# Patient Record
Sex: Female | Born: 1945 | Race: White | Hispanic: No | Marital: Married | Smoking: Never smoker
Health system: Southern US, Community
[De-identification: ages and names within clinical notes are randomized; demographics above are authoritative.]

## PROBLEM LIST (undated history)

## (undated) DIAGNOSIS — F419 Anxiety disorder, unspecified: Secondary | ICD-10-CM

## (undated) DIAGNOSIS — E8940 Asymptomatic postprocedural ovarian failure: Secondary | ICD-10-CM

## (undated) DIAGNOSIS — E7800 Pure hypercholesterolemia, unspecified: Secondary | ICD-10-CM

## (undated) DIAGNOSIS — E559 Vitamin D deficiency, unspecified: Secondary | ICD-10-CM

## (undated) DIAGNOSIS — E039 Hypothyroidism, unspecified: Secondary | ICD-10-CM

## (undated) DIAGNOSIS — R5383 Other fatigue: Secondary | ICD-10-CM

## (undated) DIAGNOSIS — R112 Nausea with vomiting, unspecified: Secondary | ICD-10-CM

## (undated) DIAGNOSIS — Z9889 Other specified postprocedural states: Secondary | ICD-10-CM

## (undated) DIAGNOSIS — I340 Nonrheumatic mitral (valve) insufficiency: Secondary | ICD-10-CM

## (undated) DIAGNOSIS — C801 Malignant (primary) neoplasm, unspecified: Secondary | ICD-10-CM

## (undated) DIAGNOSIS — H409 Unspecified glaucoma: Secondary | ICD-10-CM

## (undated) HISTORY — PX: FEMUR FRACTURE SURGERY: SHX633

## (undated) HISTORY — DX: Anxiety disorder, unspecified: F41.9

## (undated) HISTORY — DX: Vitamin D deficiency, unspecified: E55.9

## (undated) HISTORY — PX: APPENDECTOMY: SHX54

## (undated) HISTORY — DX: Other fatigue: R53.83

## (undated) HISTORY — DX: Asymptomatic postprocedural ovarian failure: E89.40

## (undated) HISTORY — PX: MANDIBLE FRACTURE SURGERY: SHX706

## (undated) HISTORY — DX: Pure hypercholesterolemia, unspecified: E78.00

## (undated) HISTORY — DX: Hypothyroidism, unspecified: E03.9

## (undated) HISTORY — PX: CHOLECYSTECTOMY: SHX55

---

## 1988-04-26 HISTORY — PX: ABDOMINAL HYSTERECTOMY: SHX81

## 1998-11-24 DIAGNOSIS — S3289XA Fracture of other parts of pelvis, initial encounter for closed fracture: Secondary | ICD-10-CM | POA: Insufficient documentation

## 2002-02-23 DIAGNOSIS — M25519 Pain in unspecified shoulder: Secondary | ICD-10-CM | POA: Insufficient documentation

## 2002-05-15 DIAGNOSIS — B029 Zoster without complications: Secondary | ICD-10-CM | POA: Insufficient documentation

## 2005-03-31 DIAGNOSIS — F411 Generalized anxiety disorder: Secondary | ICD-10-CM | POA: Insufficient documentation

## 2005-11-08 DIAGNOSIS — K8070 Calculus of gallbladder and bile duct without cholecystitis without obstruction: Secondary | ICD-10-CM | POA: Insufficient documentation

## 2007-06-21 DIAGNOSIS — M549 Dorsalgia, unspecified: Secondary | ICD-10-CM | POA: Insufficient documentation

## 2007-06-27 DIAGNOSIS — M5137 Other intervertebral disc degeneration, lumbosacral region: Secondary | ICD-10-CM | POA: Insufficient documentation

## 2007-06-27 DIAGNOSIS — M21169 Varus deformity, not elsewhere classified, unspecified knee: Secondary | ICD-10-CM | POA: Insufficient documentation

## 2007-06-27 DIAGNOSIS — M1710 Unilateral primary osteoarthritis, unspecified knee: Secondary | ICD-10-CM | POA: Insufficient documentation

## 2009-10-15 DIAGNOSIS — K219 Gastro-esophageal reflux disease without esophagitis: Secondary | ICD-10-CM | POA: Insufficient documentation

## 2011-04-27 HISTORY — PX: COLONOSCOPY: SHX174

## 2013-07-11 DIAGNOSIS — M1711 Unilateral primary osteoarthritis, right knee: Secondary | ICD-10-CM | POA: Insufficient documentation

## 2013-07-11 DIAGNOSIS — M25569 Pain in unspecified knee: Secondary | ICD-10-CM | POA: Insufficient documentation

## 2013-07-11 DIAGNOSIS — M65311 Trigger thumb, right thumb: Secondary | ICD-10-CM | POA: Insufficient documentation

## 2013-07-11 DIAGNOSIS — M7542 Impingement syndrome of left shoulder: Secondary | ICD-10-CM | POA: Insufficient documentation

## 2013-07-12 DIAGNOSIS — M5030 Other cervical disc degeneration, unspecified cervical region: Secondary | ICD-10-CM | POA: Insufficient documentation

## 2013-08-20 DIAGNOSIS — G959 Disease of spinal cord, unspecified: Secondary | ICD-10-CM | POA: Insufficient documentation

## 2016-02-23 DIAGNOSIS — H40023 Open angle with borderline findings, high risk, bilateral: Secondary | ICD-10-CM | POA: Diagnosis not present

## 2016-04-08 DIAGNOSIS — H40023 Open angle with borderline findings, high risk, bilateral: Secondary | ICD-10-CM | POA: Diagnosis not present

## 2016-06-22 DIAGNOSIS — H40023 Open angle with borderline findings, high risk, bilateral: Secondary | ICD-10-CM | POA: Diagnosis not present

## 2016-06-27 DIAGNOSIS — H401112 Primary open-angle glaucoma, right eye, moderate stage: Secondary | ICD-10-CM | POA: Insufficient documentation

## 2016-08-02 DIAGNOSIS — H401132 Primary open-angle glaucoma, bilateral, moderate stage: Secondary | ICD-10-CM | POA: Diagnosis not present

## 2016-09-06 DIAGNOSIS — H401112 Primary open-angle glaucoma, right eye, moderate stage: Secondary | ICD-10-CM | POA: Diagnosis not present

## 2016-09-06 DIAGNOSIS — H401122 Primary open-angle glaucoma, left eye, moderate stage: Secondary | ICD-10-CM | POA: Diagnosis not present

## 2016-11-09 DIAGNOSIS — E7800 Pure hypercholesterolemia, unspecified: Secondary | ICD-10-CM | POA: Diagnosis not present

## 2016-11-09 DIAGNOSIS — Z299 Encounter for prophylactic measures, unspecified: Secondary | ICD-10-CM | POA: Diagnosis not present

## 2016-11-09 DIAGNOSIS — K219 Gastro-esophageal reflux disease without esophagitis: Secondary | ICD-10-CM | POA: Diagnosis not present

## 2016-11-09 DIAGNOSIS — H409 Unspecified glaucoma: Secondary | ICD-10-CM | POA: Diagnosis not present

## 2016-11-09 DIAGNOSIS — Z6825 Body mass index (BMI) 25.0-25.9, adult: Secondary | ICD-10-CM | POA: Diagnosis not present

## 2016-11-09 DIAGNOSIS — M545 Low back pain: Secondary | ICD-10-CM | POA: Diagnosis not present

## 2016-12-07 DIAGNOSIS — H401132 Primary open-angle glaucoma, bilateral, moderate stage: Secondary | ICD-10-CM | POA: Diagnosis not present

## 2016-12-08 DIAGNOSIS — E8940 Asymptomatic postprocedural ovarian failure: Secondary | ICD-10-CM | POA: Diagnosis not present

## 2016-12-08 DIAGNOSIS — Z1231 Encounter for screening mammogram for malignant neoplasm of breast: Secondary | ICD-10-CM | POA: Diagnosis not present

## 2016-12-08 DIAGNOSIS — Z1389 Encounter for screening for other disorder: Secondary | ICD-10-CM | POA: Diagnosis not present

## 2016-12-08 DIAGNOSIS — Z6825 Body mass index (BMI) 25.0-25.9, adult: Secondary | ICD-10-CM | POA: Diagnosis not present

## 2016-12-08 DIAGNOSIS — Z0000 Encounter for general adult medical examination without abnormal findings: Secondary | ICD-10-CM | POA: Diagnosis not present

## 2016-12-08 DIAGNOSIS — Z1211 Encounter for screening for malignant neoplasm of colon: Secondary | ICD-10-CM | POA: Diagnosis not present

## 2016-12-08 DIAGNOSIS — E7800 Pure hypercholesterolemia, unspecified: Secondary | ICD-10-CM | POA: Diagnosis not present

## 2016-12-08 DIAGNOSIS — Z299 Encounter for prophylactic measures, unspecified: Secondary | ICD-10-CM | POA: Diagnosis not present

## 2016-12-08 DIAGNOSIS — Z7189 Other specified counseling: Secondary | ICD-10-CM | POA: Diagnosis not present

## 2016-12-30 DIAGNOSIS — E2839 Other primary ovarian failure: Secondary | ICD-10-CM | POA: Diagnosis not present

## 2017-01-10 DIAGNOSIS — Z1231 Encounter for screening mammogram for malignant neoplasm of breast: Secondary | ICD-10-CM | POA: Diagnosis not present

## 2017-06-06 DIAGNOSIS — K5900 Constipation, unspecified: Secondary | ICD-10-CM | POA: Diagnosis not present

## 2017-06-06 DIAGNOSIS — Z6825 Body mass index (BMI) 25.0-25.9, adult: Secondary | ICD-10-CM | POA: Diagnosis not present

## 2017-06-06 DIAGNOSIS — Z299 Encounter for prophylactic measures, unspecified: Secondary | ICD-10-CM | POA: Diagnosis not present

## 2017-06-06 DIAGNOSIS — E7800 Pure hypercholesterolemia, unspecified: Secondary | ICD-10-CM | POA: Diagnosis not present

## 2017-06-06 DIAGNOSIS — R5383 Other fatigue: Secondary | ICD-10-CM | POA: Diagnosis not present

## 2017-06-06 DIAGNOSIS — K649 Unspecified hemorrhoids: Secondary | ICD-10-CM | POA: Diagnosis not present

## 2017-06-06 DIAGNOSIS — R51 Headache: Secondary | ICD-10-CM | POA: Diagnosis not present

## 2017-06-08 DIAGNOSIS — H401132 Primary open-angle glaucoma, bilateral, moderate stage: Secondary | ICD-10-CM | POA: Diagnosis not present

## 2017-06-09 DIAGNOSIS — R279 Unspecified lack of coordination: Secondary | ICD-10-CM | POA: Diagnosis not present

## 2017-06-09 DIAGNOSIS — G43909 Migraine, unspecified, not intractable, without status migrainosus: Secondary | ICD-10-CM | POA: Diagnosis not present

## 2017-08-03 DIAGNOSIS — Z789 Other specified health status: Secondary | ICD-10-CM | POA: Diagnosis not present

## 2017-08-03 DIAGNOSIS — E7800 Pure hypercholesterolemia, unspecified: Secondary | ICD-10-CM | POA: Diagnosis not present

## 2017-08-03 DIAGNOSIS — L259 Unspecified contact dermatitis, unspecified cause: Secondary | ICD-10-CM | POA: Diagnosis not present

## 2017-08-03 DIAGNOSIS — Z6824 Body mass index (BMI) 24.0-24.9, adult: Secondary | ICD-10-CM | POA: Diagnosis not present

## 2017-08-03 DIAGNOSIS — Z299 Encounter for prophylactic measures, unspecified: Secondary | ICD-10-CM | POA: Diagnosis not present

## 2017-08-10 DIAGNOSIS — H2513 Age-related nuclear cataract, bilateral: Secondary | ICD-10-CM | POA: Diagnosis not present

## 2017-08-10 DIAGNOSIS — H401132 Primary open-angle glaucoma, bilateral, moderate stage: Secondary | ICD-10-CM | POA: Diagnosis not present

## 2017-11-30 DIAGNOSIS — H401132 Primary open-angle glaucoma, bilateral, moderate stage: Secondary | ICD-10-CM | POA: Diagnosis not present

## 2017-12-16 DIAGNOSIS — E7800 Pure hypercholesterolemia, unspecified: Secondary | ICD-10-CM | POA: Diagnosis not present

## 2017-12-16 DIAGNOSIS — Z1211 Encounter for screening for malignant neoplasm of colon: Secondary | ICD-10-CM | POA: Diagnosis not present

## 2017-12-16 DIAGNOSIS — R5383 Other fatigue: Secondary | ICD-10-CM | POA: Diagnosis not present

## 2017-12-16 DIAGNOSIS — Z7189 Other specified counseling: Secondary | ICD-10-CM | POA: Diagnosis not present

## 2017-12-16 DIAGNOSIS — Z6824 Body mass index (BMI) 24.0-24.9, adult: Secondary | ICD-10-CM | POA: Diagnosis not present

## 2017-12-16 DIAGNOSIS — C4491 Basal cell carcinoma of skin, unspecified: Secondary | ICD-10-CM | POA: Diagnosis not present

## 2017-12-16 DIAGNOSIS — Z299 Encounter for prophylactic measures, unspecified: Secondary | ICD-10-CM | POA: Diagnosis not present

## 2017-12-16 DIAGNOSIS — Z79899 Other long term (current) drug therapy: Secondary | ICD-10-CM | POA: Diagnosis not present

## 2017-12-16 DIAGNOSIS — L570 Actinic keratosis: Secondary | ICD-10-CM | POA: Diagnosis not present

## 2017-12-16 DIAGNOSIS — Z1339 Encounter for screening examination for other mental health and behavioral disorders: Secondary | ICD-10-CM | POA: Diagnosis not present

## 2017-12-16 DIAGNOSIS — Z0000 Encounter for general adult medical examination without abnormal findings: Secondary | ICD-10-CM | POA: Diagnosis not present

## 2017-12-16 DIAGNOSIS — Z1331 Encounter for screening for depression: Secondary | ICD-10-CM | POA: Diagnosis not present

## 2018-01-06 DIAGNOSIS — Z6824 Body mass index (BMI) 24.0-24.9, adult: Secondary | ICD-10-CM | POA: Diagnosis not present

## 2018-01-06 DIAGNOSIS — L821 Other seborrheic keratosis: Secondary | ICD-10-CM | POA: Diagnosis not present

## 2018-01-06 DIAGNOSIS — Z299 Encounter for prophylactic measures, unspecified: Secondary | ICD-10-CM | POA: Diagnosis not present

## 2018-01-06 DIAGNOSIS — L570 Actinic keratosis: Secondary | ICD-10-CM | POA: Diagnosis not present

## 2018-01-06 DIAGNOSIS — L820 Inflamed seborrheic keratosis: Secondary | ICD-10-CM | POA: Diagnosis not present

## 2018-01-23 DIAGNOSIS — Z1231 Encounter for screening mammogram for malignant neoplasm of breast: Secondary | ICD-10-CM | POA: Diagnosis not present

## 2018-01-27 DIAGNOSIS — Z299 Encounter for prophylactic measures, unspecified: Secondary | ICD-10-CM | POA: Diagnosis not present

## 2018-01-27 DIAGNOSIS — L570 Actinic keratosis: Secondary | ICD-10-CM | POA: Diagnosis not present

## 2018-01-27 DIAGNOSIS — Z6824 Body mass index (BMI) 24.0-24.9, adult: Secondary | ICD-10-CM | POA: Diagnosis not present

## 2018-04-27 DIAGNOSIS — Z6824 Body mass index (BMI) 24.0-24.9, adult: Secondary | ICD-10-CM | POA: Diagnosis not present

## 2018-04-27 DIAGNOSIS — F419 Anxiety disorder, unspecified: Secondary | ICD-10-CM | POA: Diagnosis not present

## 2018-04-27 DIAGNOSIS — Z299 Encounter for prophylactic measures, unspecified: Secondary | ICD-10-CM | POA: Diagnosis not present

## 2018-04-27 DIAGNOSIS — R002 Palpitations: Secondary | ICD-10-CM | POA: Diagnosis not present

## 2018-04-27 DIAGNOSIS — R000 Tachycardia, unspecified: Secondary | ICD-10-CM | POA: Diagnosis not present

## 2018-05-01 DIAGNOSIS — R002 Palpitations: Secondary | ICD-10-CM | POA: Diagnosis not present

## 2018-05-01 DIAGNOSIS — R9431 Abnormal electrocardiogram [ECG] [EKG]: Secondary | ICD-10-CM | POA: Diagnosis not present

## 2018-07-31 DIAGNOSIS — E039 Hypothyroidism, unspecified: Secondary | ICD-10-CM | POA: Diagnosis not present

## 2018-12-21 DIAGNOSIS — Z299 Encounter for prophylactic measures, unspecified: Secondary | ICD-10-CM | POA: Diagnosis not present

## 2018-12-21 DIAGNOSIS — Z1331 Encounter for screening for depression: Secondary | ICD-10-CM | POA: Diagnosis not present

## 2018-12-21 DIAGNOSIS — Z79899 Other long term (current) drug therapy: Secondary | ICD-10-CM | POA: Diagnosis not present

## 2018-12-21 DIAGNOSIS — Z7189 Other specified counseling: Secondary | ICD-10-CM | POA: Diagnosis not present

## 2018-12-21 DIAGNOSIS — R5383 Other fatigue: Secondary | ICD-10-CM | POA: Diagnosis not present

## 2018-12-21 DIAGNOSIS — E559 Vitamin D deficiency, unspecified: Secondary | ICD-10-CM | POA: Diagnosis not present

## 2018-12-21 DIAGNOSIS — E7800 Pure hypercholesterolemia, unspecified: Secondary | ICD-10-CM | POA: Diagnosis not present

## 2018-12-21 DIAGNOSIS — Z1211 Encounter for screening for malignant neoplasm of colon: Secondary | ICD-10-CM | POA: Diagnosis not present

## 2018-12-21 DIAGNOSIS — E039 Hypothyroidism, unspecified: Secondary | ICD-10-CM | POA: Diagnosis not present

## 2018-12-21 DIAGNOSIS — Z6824 Body mass index (BMI) 24.0-24.9, adult: Secondary | ICD-10-CM | POA: Diagnosis not present

## 2018-12-21 DIAGNOSIS — Z0000 Encounter for general adult medical examination without abnormal findings: Secondary | ICD-10-CM | POA: Diagnosis not present

## 2018-12-21 DIAGNOSIS — Z1339 Encounter for screening examination for other mental health and behavioral disorders: Secondary | ICD-10-CM | POA: Diagnosis not present

## 2019-01-02 DIAGNOSIS — H5200 Hypermetropia, unspecified eye: Secondary | ICD-10-CM | POA: Diagnosis not present

## 2019-01-23 DIAGNOSIS — E8940 Asymptomatic postprocedural ovarian failure: Secondary | ICD-10-CM | POA: Diagnosis not present

## 2019-01-23 DIAGNOSIS — M859 Disorder of bone density and structure, unspecified: Secondary | ICD-10-CM | POA: Diagnosis not present

## 2019-01-30 ENCOUNTER — Encounter (INDEPENDENT_AMBULATORY_CARE_PROVIDER_SITE_OTHER): Payer: Self-pay | Admitting: Nurse Practitioner

## 2019-02-01 DIAGNOSIS — H401112 Primary open-angle glaucoma, right eye, moderate stage: Secondary | ICD-10-CM | POA: Diagnosis not present

## 2019-03-29 ENCOUNTER — Other Ambulatory Visit: Payer: Self-pay

## 2019-03-29 ENCOUNTER — Other Ambulatory Visit (INDEPENDENT_AMBULATORY_CARE_PROVIDER_SITE_OTHER): Payer: Self-pay | Admitting: *Deleted

## 2019-03-29 ENCOUNTER — Encounter (INDEPENDENT_AMBULATORY_CARE_PROVIDER_SITE_OTHER): Payer: Self-pay | Admitting: *Deleted

## 2019-03-29 ENCOUNTER — Ambulatory Visit (INDEPENDENT_AMBULATORY_CARE_PROVIDER_SITE_OTHER): Payer: Medicare HMO | Admitting: Nurse Practitioner

## 2019-03-29 ENCOUNTER — Encounter (INDEPENDENT_AMBULATORY_CARE_PROVIDER_SITE_OTHER): Payer: Self-pay | Admitting: Nurse Practitioner

## 2019-03-29 ENCOUNTER — Telehealth (INDEPENDENT_AMBULATORY_CARE_PROVIDER_SITE_OTHER): Payer: Self-pay | Admitting: *Deleted

## 2019-03-29 DIAGNOSIS — R195 Other fecal abnormalities: Secondary | ICD-10-CM | POA: Insufficient documentation

## 2019-03-29 MED ORDER — SUPREP BOWEL PREP KIT 17.5-3.13-1.6 GM/177ML PO SOLN: 1.0000 | Freq: Once | ORAL | 0 refills | Status: AC

## 2019-03-29 NOTE — Telephone Encounter (Signed)
Patient needs suprep  TCS sch'd 1/21

## 2019-03-29 NOTE — Progress Notes (Signed)
Subjective:    Patient ID: Patricia Hurst, female    DOB: [birth year], 73 y.o.   MRN: [id number]  HPI [place] Bigman is 73 year old female with a past medical history of anxiety, hypothyroidism, hypercholesterolemia and remote history of GERD.  Past appendectomy, cholecystectomy and hysterectomy.  She presents today for further evaluation regarding a positive FOBT completed at the time of her recent physical exam.  She denies having any abdominal pain.  She is passing a normal formed brown bowel movement once daily.  He takes Metamucil daily.  She denies having any rectal bleeding or melena.  She reports having hemorrhoids but denies having any hemorrhoidal irritation or bleeding.  She reports completing 2 colonoscopies in her lifetime which were normal, no colon polyps.  Most recent colonoscopy was in 2013 done by Dr. Ahmed Prima in [facility].  No family history of colorectal cancer.  She previously took Zantac for reflux which she stopped approximately 1 year ago.  She has infrequent heartburn which occurs if she eats spicy foods.  She takes Pepcid as needed with symptom relief.  She denies having any dysphagia or stomach pain.  He stated at the time of her physical exam her primary care physician informed she possibly might have minor mitral valve regurgitation.  An echo was not required.  She denies having any chest pain, palpitations or shortness of breath.  History of coronary artery disease.  She denies any other complaints today.  Past Medical History:  Diagnosis Date  . Anxiety   . Fatigue   . Hypercholesteremia   . Hypothyroidism   . Ovarian failure, iatrogenic   . Vitamin D deficiency    Past Surgical History:  Procedure Laterality Date  . ABDOMINAL HYSTERECTOMY  1990  . APPENDECTOMY    . CHOLECYSTECTOMY    . COLONOSCOPY  2013   Dr. Ahmed Prima in [place]   Family History: Mother died mid 78's kidney disease. Father died from brain cancer mid 35's.  He has  3 brothers and 7 sisters.  One sister died from brain cancer.  Several of her siblings had diabetes, COPD and heart disease.  Social History: nonsmoker. No alcohol use. Married.   Review of Systems see HPI, all other systems reviewed and are negative     Objective:   Physical Exam  BP (!) 144/83 (BP Location: Right Arm, Patient Position: Sitting, Cuff Size: Large)   Pulse 74   Temp (!) 97.2 F (36.2 C) (Oral)   Ht 5\' 2"  (1.575 m)   Wt 143 lb 1.6 oz (64.9 kg)   BMI 26.17 kg/m  General: 73 year old female well-developed alert in no acute distress Eyes: Sclera nonicteric, conjunctiva pink Mouth: Few missing upper dentition, lower partial plate, no ulcers or lesions Neck: Supple Heart: Regular rate and rhythm, no murmurs Lungs: Breath sounds clear throughout Abdomen: Soft, nontender, no masses or organomegaly, positive bowel sounds to all 4 quadrants Extremities: No edema Neuro: Alert and oriented x4, no focal deficits    Assessment & Plan:   34.  73 year old female with a positive FOBT presents to schedule a colonoscopy -Colonoscopy benefits and risk discussed including risk with sedation, risk of bleeding, perforation infection -Request copy of most recent CBC from PCPs office -Further follow-up to be determined after colonoscopy completed  2.  Remote history of GERD with infrequent symptoms -Pepcid OTC 1 tab daily as needed -Patient will call our office if she has increased reflux symptoms -If she is anemic or  if her colonoscopy is negative an EGD should be done

## 2019-03-29 NOTE — Patient Instructions (Signed)
1.  Schedule a colonoscopy  2.  Further follow-up to be determined after colonoscopy completed

## 2019-04-05 ENCOUNTER — Encounter (INDEPENDENT_AMBULATORY_CARE_PROVIDER_SITE_OTHER): Payer: Self-pay | Admitting: *Deleted

## 2019-05-08 DIAGNOSIS — E039 Hypothyroidism, unspecified: Secondary | ICD-10-CM | POA: Diagnosis not present

## 2019-05-08 DIAGNOSIS — Z299 Encounter for prophylactic measures, unspecified: Secondary | ICD-10-CM | POA: Diagnosis not present

## 2019-05-15 ENCOUNTER — Other Ambulatory Visit: Payer: Self-pay

## 2019-05-15 ENCOUNTER — Other Ambulatory Visit (HOSPITAL_COMMUNITY): Payer: Medicare HMO

## 2019-05-15 ENCOUNTER — Other Ambulatory Visit (HOSPITAL_COMMUNITY)
Admission: RE | Admit: 2019-05-15 | Discharge: 2019-05-15 | Disposition: A | Discharge status: Home / Self Care | Payer: Medicare HMO | Source: Ambulatory Visit | Attending: Internal Medicine | Admitting: Internal Medicine

## 2019-05-15 DIAGNOSIS — Z20822 Contact with and (suspected) exposure to covid-19: Secondary | ICD-10-CM | POA: Diagnosis not present

## 2019-05-15 DIAGNOSIS — Z01812 Encounter for preprocedural laboratory examination: Secondary | ICD-10-CM | POA: Diagnosis not present

## 2019-05-15 LAB — SARS CORONAVIRUS 2 (TAT 6-24 HRS): SARS Coronavirus 2: NEGATIVE

## 2019-05-17 ENCOUNTER — Ambulatory Visit (HOSPITAL_COMMUNITY)
Admission: RE | Admit: 2019-05-17 | Discharge: 2019-05-17 | Disposition: A | Discharge status: Home / Self Care | Payer: Medicare HMO | Attending: Internal Medicine | Admitting: Internal Medicine

## 2019-05-17 ENCOUNTER — Encounter (HOSPITAL_COMMUNITY): Payer: Self-pay | Admitting: Internal Medicine

## 2019-05-17 ENCOUNTER — Other Ambulatory Visit: Payer: Self-pay

## 2019-05-17 ENCOUNTER — Encounter (HOSPITAL_COMMUNITY)
Admission: RE | Disposition: A | Discharge status: Home / Self Care | Payer: Self-pay | Source: Home / Self Care | Attending: Internal Medicine

## 2019-05-17 DIAGNOSIS — Z7982 Long term (current) use of aspirin: Secondary | ICD-10-CM | POA: Diagnosis not present

## 2019-05-17 DIAGNOSIS — C183 Malignant neoplasm of hepatic flexure: Secondary | ICD-10-CM | POA: Insufficient documentation

## 2019-05-17 DIAGNOSIS — E039 Hypothyroidism, unspecified: Secondary | ICD-10-CM | POA: Diagnosis not present

## 2019-05-17 DIAGNOSIS — R195 Other fecal abnormalities: Secondary | ICD-10-CM | POA: Insufficient documentation

## 2019-05-17 DIAGNOSIS — F419 Anxiety disorder, unspecified: Secondary | ICD-10-CM | POA: Insufficient documentation

## 2019-05-17 DIAGNOSIS — Z79890 Hormone replacement therapy (postmenopausal): Secondary | ICD-10-CM | POA: Diagnosis not present

## 2019-05-17 DIAGNOSIS — K644 Residual hemorrhoidal skin tags: Secondary | ICD-10-CM | POA: Diagnosis not present

## 2019-05-17 DIAGNOSIS — K648 Other hemorrhoids: Secondary | ICD-10-CM

## 2019-05-17 DIAGNOSIS — K56699 Other intestinal obstruction unspecified as to partial versus complete obstruction: Secondary | ICD-10-CM

## 2019-05-17 HISTORY — DX: Other specified postprocedural states: Z98.890

## 2019-05-17 HISTORY — DX: Nonrheumatic mitral (valve) insufficiency: I34.0

## 2019-05-17 HISTORY — PX: COLONOSCOPY: SHX5424

## 2019-05-17 HISTORY — PX: BIOPSY: SHX5522

## 2019-05-17 HISTORY — DX: Other specified postprocedural states: R11.2

## 2019-05-17 SURGERY — COLONOSCOPY
Anesthesia: Moderate Sedation

## 2019-05-17 MED ORDER — MIDAZOLAM HCL 5 MG/5ML IJ SOLN
INTRAMUSCULAR | Status: AC
  Filled 2019-05-17: qty 10

## 2019-05-17 MED ORDER — MEPERIDINE HCL 50 MG/ML IJ SOLN
INTRAMUSCULAR | Status: AC
  Filled 2019-05-17: qty 1

## 2019-05-17 MED ORDER — SODIUM CHLORIDE 0.9 % IV SOLN: INTRAVENOUS | Status: DC

## 2019-05-17 MED ORDER — MIDAZOLAM HCL 5 MG/5ML IJ SOLN
INTRAMUSCULAR | Status: DC | PRN
  Administered 2019-05-17 (×3): 1 mg via INTRAVENOUS
  Administered 2019-05-17: 2 mg via INTRAVENOUS

## 2019-05-17 MED ORDER — MEPERIDINE HCL 50 MG/ML IJ SOLN
INTRAMUSCULAR | Status: DC | PRN
  Administered 2019-05-17: 10 mg via INTRAVENOUS
  Administered 2019-05-17: 25 mg via INTRAVENOUS
  Administered 2019-05-17: 15 mg via INTRAVENOUS

## 2019-05-17 MED ORDER — STERILE WATER FOR IRRIGATION IR SOLN
Status: DC | PRN
  Administered 2019-05-17: 1.5 mL

## 2019-05-17 NOTE — Op Note (Addendum)
[facility] Patient Name: Patricia Hurst Procedure Date: 05/17/2019 11:41 AM MRN: [id number] Date of Birth: 04/07/1946 Attending MD: Hildred Laser , MD CSN: [id number] Age: 74 Admit Type: Outpatient Procedure:                Colonoscopy Indications:              Heme positive stool Providers:                Hildred Laser, MD, Charlsie Quest. Theda Sers RN, RN,                            Raphael Gibney, Technician Referring MD:             Virl Son Hairfield, NP Medicines:                Meperidine 50 mg IV, Midazolam 5 mg IV Complications:            No immediate complications. Estimated Blood Loss:     Estimated blood loss was minimal. Procedure:                Pre-Anesthesia Assessment:                           - Prior to the procedure, a History and Physical                            was performed, and patient medications and                            allergies were reviewed. The patient's tolerance of                            previous anesthesia was also reviewed. The risks                            and benefits of the procedure and the sedation                            options and risks were discussed with the patient.                            All questions were answered, and informed consent                            was obtained. Prior Anticoagulants: The patient has                            taken no previous anticoagulant or antiplatelet                            agents except for aspirin. ASA Grade Assessment: I                            - A normal, healthy patient. After reviewing the  risks and benefits, the patient was deemed in                            satisfactory condition to undergo the procedure.                           After obtaining informed consent, the colonoscope                            was passed under direct vision. Throughout the                            procedure, the patient's blood pressure, pulse, and                      oxygen saturations were monitored continuously. The                            [id number]-[id number]) scope was introduced through                            the anus and advanced to the the cecum, identified                            by appendiceal orifice and ileocecal valve. The                            colonoscopy was technically difficult and complex                            due to bowel stenosis. Successful completion of the                            procedure was aided by withdrawing the scope and                            replacing with the 'babyscope'. The patient                            tolerated the procedure well. The quality of the                            bowel preparation was adequate. The ileocecal                            valve, appendiceal orifice, and rectum were                            photographed. Scope In: 12:45:49 PM Scope Out: 1:28:54 PM Scope Withdrawal Time: 0 hours 15 minutes 9 seconds  Total Procedure Duration: 0 hours 43 minutes 5 seconds  Findings:      Skin tags were found on perianal exam.      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 and  It was traversed with ultra-slim scope. Biopsies were       taken with a cold forceps for histology. The pathology specimen was       placed into Bottle Number 1.      The exam was otherwise normal throughout the examined colon.      External and internal hemorrhoids were found during retroflexion. The       hemorrhoids were medium-sized. Impression:               - Perianal skin tags found on perianal exam.                           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                            Biopsied.                           - External and internal hemorrhoids.                           comment: chronic ischemic injury can have siimilar                             appearence. Moderate Sedation:      Moderate (conscious) sedation was administered by the endoscopy nurse       and supervised by the endoscopist. The following parameters were       monitored: oxygen saturation, heart rate, blood pressure, CO2       capnography and response to care. Total physician intraservice time was       44 minutes. Recommendation:           - Patient has a contact number available for                            emergencies. The signs and symptoms of potential                            delayed complications were discussed with the                            patient. Return to normal activities tomorrow.                            Written discharge instructions were provided to the                            patient.                           - Resume previous diet today.                           - Continue present medications.                           -  No aspirin, ibuprofen, naproxen, or other                            non-steroidal anti-inflammatory drugs for 1 day.                           - Await pathology results.                           - Repeat colonoscopy is recommended. The                            colonoscopy date will be determined after pathology                            results from today's exam become available for                            review. Procedure Code(s):        --- Professional ---                           [id number], Colonoscopy, flexible; with biopsy, single                            or multiple                           99153, Moderate sedation; each additional 15                            minutes intraservice time                           99153, Moderate sedation; each additional 15                            minutes intraservice time                           G0500, Moderate sedation services provided by the                            same physician or other qualified health care                             professional performing a gastrointestinal                            endoscopic service that sedation supports,                            requiring the presence of an independent trained                            observer to assist in the monitoring of the  patient's level of consciousness and physiological                            status; initial 15 minutes of intra-service time;                            patient age 1 years or older (additional time may                            be reported with [id number], as appropriate) Diagnosis Code(s):        --- Professional ---                           [phone], Other intestinal obstruction unspecified                            as to partial versus complete obstruction                           K64.4, Residual hemorrhoidal skin tags                           K64.8, Other hemorrhoids                           R19.5, Other fecal abnormalities CPT copyright 2019 American Medical Association. All rights reserved. The codes documented in this report are preliminary and upon coder review may  be revised to meet current compliance requirements. Hildred Laser, MD Hildred Laser, MD 05/17/2019 1:42:08 PM This report has been signed electronically. Number of Addenda: 0

## 2019-05-17 NOTE — H&P (Signed)
Patricia Hurst is an 74 y.o. female.   Chief Complaint: Patient is here for colonoscopy. HPI: Patient is 73 year old Caucasian female whose here for diagnostic colonoscopy.  Study recently was found to have positive fecal occult blood.  She has history of hemorrhoids but she says she never bleeds.  Since she has been taking Metamucil her bowels have been regular.  She denies melena or abdominal pain.  She has good appetite.  Last colonoscopy was about 8 years ago and was normal and she had one prior to that for screening purposes as well She is on low-dose aspirin which is on hold. Family history is negative for CRC.  Past Medical History:  Diagnosis Date  . Anxiety   . Fatigue   . Hypercholesteremia   . Hypothyroidism   . Mitral valve regurgitation   . Ovarian failure, iatrogenic   . PONV (postoperative nausea and vomiting)   . Vitamin D deficiency     Past Surgical History:  Procedure Laterality Date  .  Vaginal HYSTERECTOMY  1990  . APPENDECTOMY    . CHOLECYSTECTOMY    . COLONOSCOPY  2013   Dr. Ahmed Prima in [place]    Family History  Problem Relation Age of Onset  . Kidney disease Mother   . Hypertension Mother   . Brain cancer Father   . Diabetes Sister   . Breast cancer Sister   . COPD Sister   . COPD Sister   . Diabetes Sister   . Cystic fibrosis Brother   . COPD Brother   . Diabetes Son   . Thyroid disease Daughter    Social History:  reports that she has never smoked. She has never used smokeless tobacco. She reports previous alcohol use. She reports that she does not use drugs.  Allergies:  Allergies  Allergen Reactions  . Morphine Sulfate Rash    This per the patient's referral from her PCP.    Medications Prior to Admission  Medication Sig Dispense Refill  . acetaminophen (TYLENOL) 500 MG tablet Take 500 mg by mouth at bedtime and may repeat dose one time if needed.    Marland Kitchen aspirin EC 81 MG tablet Take 81 mg by mouth daily.    . Calcium  Carb-Cholecalciferol (CALCIUM 500 + D3 PO) Take 1 tablet by mouth at bedtime.     Marland Kitchen levothyroxine (SYNTHROID) 25 MCG tablet Take 25 mcg by mouth daily before breakfast.    . loratadine (CLARITIN) 10 MG tablet Take 10 mg by mouth daily.    . Multiple Vitamin (MULTIVITAMIN WITH MINERALS) TABS tablet Take 1 tablet by mouth at bedtime.    Marland Kitchen Phenyleph-CPM-DM-Aspirin (ALKA-SELTZER PLUS COLD & COUGH PO) Take 1 tablet by mouth daily as needed (cold symptoms.).    Marland Kitchen psyllium (METAMUCIL) 58.6 % powder Take 1 packet by mouth daily. Mixed with OJ    . SUPREP BOWEL PREP KIT 17.5-3.13-1.6 GM/177ML SOLN Take 354 mLs by mouth once.    . timolol (TIMOPTIC) 0.5 % ophthalmic solution Place 1 drop into both eyes daily.      No results found for this or any previous visit (from the past 48 hour(s)). No results found.  Review of Systems  Blood pressure 125/80, pulse 80, temperature 98.8 F (37.1 C), temperature source Oral, resp. rate 12, height _0  (1.575 m), weight 64.4 kg, SpO2 100 %. Physical Exam  Constitutional: She appears well-developed and well-nourished.  HENT:  Mouth/Throat: Oropharynx is clear and moist.  Eyes: Conjunctivae are normal.  No scleral icterus.  Neck: No thyromegaly present.  Cardiovascular: Normal rate, regular rhythm and normal heart sounds.  No murmur heard. Respiratory: Effort normal and breath sounds normal.  GI: Soft. She exhibits no distension and no mass.  Musculoskeletal:        General: No edema.  Lymphadenopathy:    She has no cervical adenopathy.  Neurological: She is alert.  Skin: Skin is warm and dry.     Assessment/Plan Heme positive stool. Diagnostic colonoscopy.  Hildred Laser, MD 05/17/2019, 12:36 PM

## 2019-05-17 NOTE — Discharge Instructions (Signed)
No aspirin or NSAIDs for 24 hours. Resume other medications as before. Resume usual diet. No driving for 24 hours. Physician will call with biopsy results.   Colonoscopy, Adult, Care After This sheet gives you information about how to care for yourself after your procedure. Your doctor may also give you more specific instructions. If you have problems or questions, call your doctor. What can I expect after the procedure? After the procedure, it is common to have:  A small amount of blood in your poop (stool) for 24 hours.  Some gas.  Mild cramping or bloating in your belly (abdomen). Follow these instructions at home: Eating and drinking   Drink enough fluid to keep your pee (urine) pale yellow.  Follow instructions from your doctor about what you cannot eat or drink.  Return to your normal diet as told by your doctor. Avoid heavy or fried foods that are hard to digest. Activity  Rest as told by your doctor.  Do not sit for a long time without moving. Get up to take short walks every 1-2 hours. This is important. Ask for help if you feel weak or unsteady.  Return to your normal activities as told by your doctor. Ask your doctor what activities are safe for you. To help cramping and bloating:   Try walking around.  Put heat on your belly as told by your doctor. Use the heat source that your doctor recommends, such as a moist heat pack or a heating pad. ? Put a towel between your skin and the heat source. ? Leave the heat on for 20-30 minutes. ? Remove the heat if your skin turns bright red. This is very important if you are unable to feel pain, heat, or cold. You may have a greater risk of getting burned. General instructions  For the first 24 hours after the procedure: ? Do not drive or use machinery. ? Do not sign important documents. ? Do not drink alcohol. ? Do your daily activities more slowly than normal. ? Eat foods that are soft and easy to digest.  Take  over-the-counter or prescription medicines only as told by your doctor.  Keep all follow-up visits as told by your doctor. This is important. Contact a doctor if:  You have blood in your poop 2-3 days after the procedure. Get help right away if:  You have more than a small amount of blood in your poop.  You see large clumps of tissue (blood clots) in your poop.  Your belly is swollen.  You feel like you may vomit (nauseous).  You vomit.  You have a fever.  You have belly pain that gets worse, and medicine does not help your pain. Summary  After the procedure, it is common to have a small amount of blood in your poop. You may also have mild cramping and bloating in your belly.  For the first 24 hours after the procedure, do not drive or use machinery, do not sign important documents, and do not drink alcohol.  Get help right away if you have a lot of blood in your poop, feel like you may vomit, have a fever, or have more belly pain. This information is not intended to replace advice given to you by your health care provider. Make sure you discuss any questions you have with your health care provider. Document Revised: 11/06/2018 Document Reviewed: 11/06/2018 [employer]vier Patient Education  2020 [employer]  

## 2019-05-21 ENCOUNTER — Other Ambulatory Visit (INDEPENDENT_AMBULATORY_CARE_PROVIDER_SITE_OTHER): Payer: Self-pay | Admitting: *Deleted

## 2019-05-21 ENCOUNTER — Other Ambulatory Visit (INDEPENDENT_AMBULATORY_CARE_PROVIDER_SITE_OTHER): Payer: Self-pay | Admitting: Internal Medicine

## 2019-05-21 DIAGNOSIS — C801 Malignant (primary) neoplasm, unspecified: Secondary | ICD-10-CM

## 2019-05-21 DIAGNOSIS — Z01812 Encounter for preprocedural laboratory examination: Secondary | ICD-10-CM

## 2019-05-22 DIAGNOSIS — Z01812 Encounter for preprocedural laboratory examination: Secondary | ICD-10-CM | POA: Diagnosis not present

## 2019-05-22 LAB — CREATININE, SERUM: Creat: 1 mg/dL — ABNORMAL HIGH (ref 0.60–0.93)

## 2019-05-22 LAB — SURGICAL PATHOLOGY

## 2019-05-24 ENCOUNTER — Encounter: Payer: Self-pay | Admitting: General Surgery

## 2019-05-24 ENCOUNTER — Ambulatory Visit: Payer: Medicare HMO | Admitting: General Surgery

## 2019-05-24 ENCOUNTER — Other Ambulatory Visit: Payer: Self-pay

## 2019-05-24 ENCOUNTER — Ambulatory Visit (HOSPITAL_COMMUNITY)
Admission: RE | Admit: 2019-05-24 | Discharge: 2019-05-24 | Disposition: A | Discharge status: Home / Self Care | Payer: Medicare HMO | Source: Ambulatory Visit | Attending: Internal Medicine | Admitting: Internal Medicine

## 2019-05-24 VITALS — BP 154/72 | HR 82 | Temp 98.2°F | Resp 16 | Ht 63.0 in | Wt 140.0 lb

## 2019-05-24 DIAGNOSIS — C801 Malignant (primary) neoplasm, unspecified: Secondary | ICD-10-CM | POA: Diagnosis present

## 2019-05-24 DIAGNOSIS — C183 Malignant neoplasm of hepatic flexure: Secondary | ICD-10-CM | POA: Diagnosis not present

## 2019-05-24 DIAGNOSIS — K869 Disease of pancreas, unspecified: Secondary | ICD-10-CM | POA: Insufficient documentation

## 2019-05-24 DIAGNOSIS — C182 Malignant neoplasm of ascending colon: Secondary | ICD-10-CM | POA: Insufficient documentation

## 2019-05-24 DIAGNOSIS — I700 Atherosclerosis of aorta: Secondary | ICD-10-CM | POA: Insufficient documentation

## 2019-05-24 MED ORDER — METRONIDAZOLE 500 MG PO TABS: 1000.0000 mg | ORAL_TABLET | ORAL | 0 refills | Status: DC

## 2019-05-24 MED ORDER — IOHEXOL 300 MG/ML  SOLN
100.0000 mL | Freq: Once | INTRAMUSCULAR | Status: AC | PRN
  Administered 2019-05-24: 100 mL via INTRAVENOUS

## 2019-05-24 MED ORDER — NEOMYCIN SULFATE 500 MG PO TABS: 1000.0000 mg | ORAL_TABLET | ORAL | 0 refills | Status: DC

## 2019-05-24 NOTE — Progress Notes (Signed)
[facility] History and Physical  Reason for Referral: Colon cancer  Referring Physician:  Dr. Laural Golden   Chief Complaint    adenocarcinoma      Patricia Hurst is a 74 y.o. female.  HPI: Patricia Hurst is a 74 yo with recent fecal occult blood test that was positive and colonoscopy that demonstrated a mass on colonoscopy that was biopsied by Dr. Laural Golden and revealed adenocarcinoma.  She reports that she otherwise has been in her normal state of health and had her last colonoscopy about 8 years prior. She says her PCP has told her about some minor mitral regurgitation found on ECHO, but that she has no symptoms and has not seen a cardiologist. She says that the PCP reports that this was very minor.  The patient has never had any cardiac issues or stroke. She reports no SOB or chest pain.  Past Medical History:  Diagnosis Date  . Anxiety   . Fatigue   . Hypercholesteremia   . Hypothyroidism   . Mitral valve regurgitation   . Ovarian failure, iatrogenic   . PONV (postoperative nausea and vomiting)   . Vitamin D deficiency    Appendectomy with Hysterectomy 1990s, Cholecystectomy 2005 Past Surgical History:  Procedure Laterality Date  . ABDOMINAL HYSTERECTOMY  1990  . APPENDECTOMY    . BIOPSY  05/17/2019   Procedure: BIOPSY;  Surgeon: Rogene Houston, MD;  Location: [facility];  Service: Endoscopy;;  . CHOLECYSTECTOMY    . COLONOSCOPY  2013   Dr. Ahmed Prima in [facility] [place]  . COLONOSCOPY N/A 05/17/2019   Procedure: COLONOSCOPY;  Surgeon: Rogene Houston, MD;  Location: [facility];  Service: Endoscopy;  Laterality: N/A;  12:45    Family History  Problem Relation Age of Onset  . Kidney disease Mother   . Hypertension Mother   . Brain cancer Father   . Diabetes Sister   . Breast cancer Sister   . COPD Sister   . COPD Sister   . Diabetes Sister   . Cystic fibrosis Brother   . COPD Brother   . Diabetes Son   . Thyroid disease Daughter     Social  History   Tobacco Use  . Smoking status: Never Smoker  . Smokeless tobacco: Never Used  Substance Use Topics  . Alcohol use: Not Currently  . Drug use: Never    Medications: I have reviewed the patient's current medications. Allergies as of 05/24/2019      Reactions   Morphine Sulfate Rash   This per the patient's referral from her PCP.      Medication List       Accurate as of May 24, 2019 11:59 PM. If you have any questions, ask your nurse or doctor.        acetaminophen 500 MG tablet Commonly known as: TYLENOL Take 500 mg by mouth at bedtime and may repeat dose one time if needed.   ALKA-SELTZER PLUS COLD & COUGH PO Take 1 tablet by mouth daily as needed (cold symptoms.).   aspirin EC 81 MG tablet Take 1 tablet (81 mg total) by mouth daily.   CALCIUM 500 + D3 PO Take 1 tablet by mouth at bedtime.   levothyroxine 25 MCG tablet Commonly known as: SYNTHROID Take 25 mcg by mouth daily before breakfast.   loratadine 10 MG tablet Commonly known as: CLARITIN Take 10 mg by mouth daily.   metroNIDAZOLE 500 MG tablet Commonly known as: Flagyl Take 2  tablets (1,000 mg total) by mouth as directed. Take 2 flagyl 500mg  tablets at 2 pm, 3pm,10pm. Started by: Virl Cagey, MD   multivitamin with minerals Tabs tablet Take 1 tablet by mouth at bedtime.   neomycin 500 MG tablet Commonly known as: MYCIFRADIN Take 2 tablets (1,000 mg total) by mouth as directed. Take 2 neomycin 500mg  tablets at 2 pm, 3pm,10pm. Started by: Virl Cagey, MD   psyllium 58.6 % powder Commonly known as: METAMUCIL Take 1 packet by mouth daily. Mixed with OJ   timolol 0.5 % ophthalmic solution Commonly known as: TIMOPTIC Place 1 drop into both eyes daily.        ROS:  A comprehensive review of systems was negative except for: Genitourinary: positive for frequency Musculoskeletal: positive for back pain, neck pain and stiff joints Neurological: positive for dizziness   Blood pressure (!) 154/72, pulse 82, temperature 98.2 F (36.8 C), temperature source Oral, resp. rate 16, height 5\' 3"  (1.6 m), weight 140 lb (63.5 kg), SpO2 97 %. Physical Exam Vitals reviewed.  Constitutional:      Appearance: Normal appearance.  HENT:     Head: Normocephalic and atraumatic.     Nose: Nose normal.     Mouth/Throat:     Mouth: Mucous membranes are moist.  Eyes:     Extraocular Movements: Extraocular movements intact.     Pupils: Pupils are equal, round, and reactive to light.  Cardiovascular:     Rate and Rhythm: Normal rate and regular rhythm.  Pulmonary:     Effort: Pulmonary effort is normal.     Breath sounds: Normal breath sounds.  Abdominal:     General: There is no distension.     Palpations: Abdomen is soft.     Tenderness: There is no abdominal tenderness.  Musculoskeletal:        General: No swelling.  Skin:    General: Skin is warm and dry.  Neurological:     General: No focal deficit present.     Mental Status: She is alert and oriented to person, place, and time.  Psychiatric:        Mood and Affect: Mood normal.        Behavior: Behavior normal.        Thought Content: Thought content normal.        Judgment: Judgment normal.     Results: CT a/p - personally reviewed, area in the ascending colon with wall thickening IMPRESSION: 1. Circumferential wall thickening in the ascending colon just proximal to the hepatic flexure, compatible with tumor found at endoscopy. No findings of metastatic disease. 2. Abnormal enhancement/wall thickening in the left kidney upper pole calyces/collecting system. Possibilities include urothelial tumor or local irritation from a tiny calculus. Urology referral recommended for further workup. 3. 1.2 by 0.6 by 0.7 cm fluid density lesion in the head of the pancreas. This could be a small postinflammatory cystic lesion or intraductal papillary mucinous neoplasm. Pancreatic protocol MRI is recommended in 2  years time to follow up this finding. This Recommendation follows ACR consensus guidelines: Management of Incidental Pancreatic Cysts: A White Paper of the ACR Incidental Findings Committee. J Am Coll Radiol [id number]. 4. Mild bilateral foraminal impingement at L5-S1 due to disc bulge spurring. 5.  Aortic Atherosclerosis (ICD10-I70.0).  Pathology: FINAL MICROSCOPIC DIAGNOSIS:   A. COLON, HEPATIC FLEXURE, BIOPSY:  - Adenocarcinoma  - See comment    Assessment & Plan:  Patricia Hurst is a 74 y.o. female with an  ascending colon cancer and no obvious signs of metastatic disease on her CT scan. She will need to undergo a partial colectomy. We discussed trying to doing this laparoscopically if possible but that her prior surgeries may prevent this, and we discussed that we would want to ensure a good cancer surgery.  We discussed the risk of bleeding, infection, anastomotic leak, injury to other organs or the ureter.  She expressed understanding. We discussed that her stage would not be known until the pathology returns, and that she would be referred to Oncology for further treatment options.   Dr. Laural Golden had reached out to Dr. Alyson Ingles regarding the concern for the left kidney abnormality. Dr. Alyson Ingles noted a stone and says this is related to a stone and not tumor.  There is also a question of a small area on the pancreas that will need surveillance MRI, and I will ensure Dr. Laural Golden is planning on doing this versus Oncology.   Colon Preparation:  -Buy from the Store: Miralax bottle [phone]).  Gatorade 64 oz (not red). Dulcolax tablets.   The Day Prior to Surgery: Take 4 ducolax tablets at 7am with water. Drink plenty of clear liquids all day to avoid dehydration, no solid food.    Mix the bottle of Miralax and 64 oz of Gatorade and drink this mixture starting at 10am. Drink it gradually over the next few hours, 8 ounces every 15-30 minutes until it is gone. Finish this by 2pm.   Take 2 neomycin 500mg  tablets and 2 metronidazole 500mg  tablets at 2 pm. Take 2 neomycin 500mg  tablets and 2 metronidazole 500mg  tablets at 3pm. Take 2 neomycin 500mg  tablets and 2 metronidazole 500mg  tablets at 10pm.    Do not eat or drink anything after midnight the night before your surgery.  Do not eat or drink anything that morning, and take medications as instructed by the hospital staff on your preoperative visit.    Will plan for the upcoming weeks. Discussed COVID testing and post operative course.  Will do an ECHO to have in our system, given the prior report of some mitral regurgitation. This needs to be scheduled prior to her surgery.  Future Appointments  Date Time Provider [place]  05/31/2019  9:00 AM [facility]-SCREENING [facility] None  05/31/2019  9:30 AM [facility] PAT 2 [facility] None  05/31/2019 10:30 AM [facility] - ECHO 1 OUTPATIENT [facility]     All questions were answered to the satisfaction of the patient.    Virl Cagey 05/28/2019, 1:02 PM

## 2019-05-24 NOTE — Patient Instructions (Addendum)
Colon Preparation:  Buy from the Store: Miralax bottle [phone]).  Gatorade 64 oz (not red). Dulcolax tablets.   The Day Prior to Surgery: Take 4 ducolax tablets at 7am with water. Drink plenty of clear liquids all day to avoid dehydration, no solid food.    Mix the bottle of Miralax and 64 oz of Gatorade and drink this mixture starting at 10am. Drink it gradually over the next few hours, 8 ounces every 15-30 minutes until it is gone. Finish this by 2pm.  Take 2 neomycin 500mg  tablets and 2 metronidazole 500mg  tablets at 2 pm. Take 2 neomycin 500mg  tablets and 2 metronidazole 500mg  tablets at 3pm. Take 2 neomycin 500mg  tablets and 2 metronidazole 500mg  tablets at 10pm.    Do not eat or drink anything after midnight the night before your surgery.  Do not eat or drink anything that morning, and take medications as instructed by the hospital staff on your preoperative visit.    Laparoscopic Partial Colectomy Laparoscopic colectomy is surgery to remove part or all of the large intestine (colon). This procedure may be used to treat several conditions, including:  Inflammation and infection of the colon (diverticulitis).  Tumors or masses in the colon.  Inflammatory bowel disease, such as Crohn disease or ulcerative colitis. Colectomy is an option when symptoms cannot be controlled with medicines.  Bleeding from the colon that cannot be controlled by another method.  Blockage or obstruction of the colon. Tell a health care provider about:  Any allergies you have.  All medicines you are taking, including vitamins, herbs, eye drops, creams, and over-the-counter medicines.  Any problems you or family members have had with anesthetic medicines.  Any blood disorders you have.  Any surgeries you have had.  Any medical conditions you have. What are the risks? Generally, this is a safe procedure. However, problems may occur, including:  Infection.  Bleeding.  Allergic reactions to  medicines or dyes.  Damage to other structures or organs.  Leaking from where the colon was sewn together.  Future blockage of the small intestines from scar tissue. Another surgery may be needed to repair this.  Needing to convert to an open procedure. Complications such as damage to other organs or excessive bleeding may require the surgeon to convert from a laparoscopic procedure to an open procedure. This involves making a larger incision in the abdomen. Medicines  Ask your health care provider about: ? Changing or stopping your regular medicines. This is especially important if you are taking diabetes medicines or blood thinners. ? Taking medicines such as aspirin and ibuprofen. These medicines can thin your blood. Do not take these medicines before your procedure if your health care provider instructs you not to.  You may be given antibiotic medicine to clean out bacteria from your colon. Follow the directions carefully and take the medicine at the correct time. General instructions  You may be prescribed an oral bowel prep to clean out your colon in preparation for the surgery: ? Follow instructions from your health care provider about how to do this. ? Do not eat or drink anything else after you have started the bowel prep, unless your health care provider tells you it is safe to do so.  Do not use any products that contain nicotine or tobacco, such as cigarettes and e-cigarettes. If you need help quitting, ask your health care provider. What happens during the procedure?  To reduce your risk of infection: ? Your health care team will wash or  sanitize their hands. ? Your skin will be washed with soap.  An IV tube will be inserted into one of your veins to deliver fluid and medication.  You will be given one of the following: ? A medicine to help you relax (sedative). ? A medicine to make you fall asleep (general anesthetic).  Small monitors will be connected to your body.  They will be used to check your heart, blood pressure, and oxygen level.  A breathing tube may be placed into your lungs during the procedure.  A thin, flexible tube (catheter) will be placed into your bladder to drain urine.  A tube may be placed through your nose and into your stomach to drain stomach fluids (nasogastric tube, or NG tube).  Your abdomen will be filled with air so it expands. This gives the surgeon more room to operate and makes your organs easier to see.  Several small cuts (incisions) will be made in your abdomen.  A thin, lighted tube with a tiny camera on the end (laparoscope) will be put through one of the small incisions. The camera on the laparoscope will send a picture to a computer screen in the operating room. This will give the surgeon a good view inside your abdomen.  Hollow tubes will be put through the other small incisions in your abdomen. The tools that are needed for the procedure will be put through these tubes.  Clamps or staples will be put on both ends of the diseased part of the colon.  The part of the intestine between the clamps or staples will be removed.  If possible, the ends of the healthy colon that remain will be stitched (sutured) or stapled together to allow your body to pass waste (stool).  Sometimes, the remaining colon cannot be stitched back together. If this is the case, a colostomy will be needed. If you need a colostomy: ? An opening to the outside of your body (stoma) will be made through your abdomen. ? The end of your colon will be brought to the opening. It will be stitched to the skin. ? A bag will be attached to the opening. Stool will drain into this removable bag. ? The colostomy may be temporary or permanent.  The incisions from the colectomy will be closed with sutures or staples. The procedure may vary among health care providers and hospitals. What happens after the procedure?  Your blood pressure, heart rate,  breathing rate, and blood oxygen level will be monitored until the medicines you were given have worn off.  You will receive fluids through an IV tube until your bowels start to work properly.  Once your bowels are working again, you will be given clear liquids first and then solid food as tolerated.  You will be given medicines to control your pain and nausea, if needed.  Do not drive for 24 hours if you were given a sedative. This information is not intended to replace advice given to you by your health care provider. Make sure you discuss any questions you have with your health care provider. Document Revised: 03/25/2017 Document Reviewed: 01/12/2016 Elsevier Patient Education  2020 [employer].   Colorectal Cancer  Colorectal cancer is an abnormal growth of cells and tissue (tumor) in the colon or rectum, which are parts of the large intestine. The cancer can spread (metastasize) to other parts of the body. What are the causes? Most cases of colorectal cancer start as abnormal growths called polyps on the inner wall  of the colon or rectum. Other times, abnormal changes to genes (genetic mutations) can cause cells to form cancer. What increases the risk? You are more likely to develop this condition if:  You are older than age 75.  You have multiple polyps in the colon or rectum.  You have diabetes.  You are African American.  You have a family history of Lynch syndrome.  You have had cancer before.  You have certain hereditary conditions, such as: ? Familial adenomatous polyposis. ? Turcot syndrome. ? Peutz-Jeghers syndrome.  You eat a diet that is high in fat (especially animal fat) and low in fiber, fruits, and vegetables.  You have an inactive (sedentary) lifestyle.  You have an inflammatory bowel disease or Crohn's disease.  You smoke.  You drink alcohol excessively. What are the signs or symptoms? Early colorectal cancer often does not cause symptoms. As  the cancer grows, symptoms may include:  Changes in bowel habits.  Feeling like the bowel does not empty completely after a bowel movement.  Stools that are narrower than usual.  Blood in the stool.  Diarrhea.  Constipation.  Anemia.  Discomfort, pain, bloating, fullness, or cramps in the abdomen.  Frequent gas pain.  Unexplained weight loss.  Constant fatigue.  Nausea and vomiting. How is this diagnosed? This condition may be diagnosed with:  A medical history.  A physical exam.  Tests. These may include: ? A digital rectal exam. ? A stool test called a fecal occult blood test. ? Blood tests. ? A test in which a tissue sample is taken from the colon or rectum and examined under a microscope (biopsy). ? Imaging tests, such as:  X-rays.  A barium enema.  CT scans.  MRIs.  A sigmoidoscopy. This test is done to view the inside of the rectum.  A colonoscopy. This test is done to view the inside of the colon. During this test, small polyps can be removed or biopsies may be taken.  An endorectal ultrasound. This test checks how deep a tumor in the rectum has grown and whether the cancer has spread to lymph nodes or other nearby tissues. Your health care provider may order additional tests to find out whether the cancer has spread to other parts of the body (what stage it is). The stages of cancer include:  Stage 0. At this stage, the cancer is found only in the innermost lining of the colon or rectum.  Stage I. At this stage, the cancer has grown into the inner wall of the colon or rectum.  Stage II. At this stage, the cancer has gone more deeply into the wall of the colon or rectum or through the wall. It may have invaded nearby tissue.  Stage III. At this stage, the cancer has spread to nearby lymph nodes.  Stage IV. At this stage, the cancer has spread to other parts of the body, such as the liver or lungs. How is this treated? Treatment for this condition  depends on the type and stage of the cancer. Treatment may include:  Surgery. In the early stages of the cancer, surgery may be done to remove polyps or small tumors from the colon. In later stages, surgery may be done to remove part of the colon.  Chemotherapy. This treatment uses medicines to kill cancer cells.  Targeted therapy. This treatment targets specific gene mutations or proteins that the cancer expresses in order to kill tumor cells.  Radiation therapy. This treatment uses radiation to kill cancer  cells or shrink tumors.  Radiofrequency ablation. This treatment uses radio waves to destroy the tumors that may have spread to other areas of the body, such as the liver. Follow these instructions at home:  Take over-the-counter and prescription medicines only as told by your health care provider.  Try to eat regular, healthy meals. Some of your treatments might affect your appetite. If you are having problems eating or with your appetite, ask to meet with a food and nutrition specialist (dietitian).  Consider joining a support group. This may help you learn about your diagnosis and cope with the stress of having colorectal cancer.  If you are admitted to the hospital, inform your cancer care team.  Keep all follow-up visits as told by your health care provider. This is important. How is this prevented?  Colorectal cancer can be prevented with screening tests that find polyps so they can be removed before they develop into cancer.  All adults should have screening for colorectal cancer starting at age 4 and continuing until age 31. Your health care provider may recommend screening at age 30. People at increased risk should start screening at an earlier age. Where to find more information  American Cancer Society: https://www.cancer.[place] https://www.cancer.gov Contact a health care provider if:  Your diarrhea or constipation does not go  away.  You have blood in your stool.  Your bowel habits change.  You have increased pain in your abdomen.  You notice new fatigue or weakness.  You lose weight. Get help right away if:  You have increased bleeding from your rectum.  You have any uncontrollable or severe abdomen (abdominal) symptoms. Summary  Colorectal cancer is an abnormal growth of cells and tissue (tumor) in the colon or rectum.  Common risk factors for this condition include having a relative with colon cancer, being older in age, having an inflammatory bowel disease, and being African American.  This condition may be diagnosed with tests, such as a colonoscopy and biopsy.  Treatment depends on the type and stage of the cancer. Commonly, treatment includes surgery to remove the tumor along with chemotherapy or targeted therapy.  Keep all follow-up visits as told by your health care provider. This is important. This information is not intended to replace advice given to you by your health care provider. Make sure you discuss any questions you have with your health care provider. Document Revised: 06/02/2017 Document Reviewed: 05/14/2016 Elsevier Patient Education  [employer].

## 2019-05-29 NOTE — H&P (Signed)
[facility] History and Physical  Reason for Referral: Colon cancer  Referring Physician: Dr. Laural Golden     Chief Complaint     adenocarcinoma      Patricia Hurst is a 74 y.o. female.  HPI: Patricia Hurst is a 74 yo with recent fecal occult blood test that was positive and colonoscopy that demonstrated a mass on colonoscopy that was biopsied by Dr. Laural Golden and revealed adenocarcinoma. She reports that she otherwise has been in her normal state of health and had her last colonoscopy about 8 years prior. She says her PCP has told her about some minor mitral regurgitation found on ECHO, but that she has no symptoms and has not seen a cardiologist. She says that the PCP reports that this was very minor. The patient has never had any cardiac issues or stroke. She reports no SOB or chest pain.      Past Medical History:  Diagnosis Date  . Anxiety   . Fatigue   . Hypercholesteremia   . Hypothyroidism   . Mitral valve regurgitation   . Ovarian failure, iatrogenic   . PONV (postoperative nausea and vomiting)   . Vitamin D deficiency    Appendectomy with Hysterectomy 1990s, Cholecystectomy 2005       Past Surgical History:  Procedure Laterality Date  . ABDOMINAL HYSTERECTOMY  1990  . APPENDECTOMY    . BIOPSY  05/17/2019   Procedure: BIOPSY; Surgeon: Rogene Houston, MD; Location: [facility]; Service: Endoscopy;;  . CHOLECYSTECTOMY    . COLONOSCOPY  2013   Dr. Ahmed Prima in [facility]  . COLONOSCOPY N/A 05/17/2019   Procedure: COLONOSCOPY; Surgeon: Rogene Houston, MD; Location: [facility]; Service: Endoscopy; Laterality: N/A; 12:45        Family History  Problem Relation Age of Onset  . Kidney disease Mother   . Hypertension Mother   . Brain cancer Father   . Diabetes Sister   . Breast cancer Sister   . COPD Sister   . COPD Sister   . Diabetes Sister   . Cystic fibrosis Brother   . COPD Brother   . Diabetes Son   . Thyroid disease Daughter     Social History       Tobacco Use  . Smoking status: Never Smoker  . Smokeless tobacco: Never Used  Substance Use Topics  . Alcohol use: Not Currently  . Drug use: Never   Medications: I have reviewed the patient's current medications.       Allergies as of 05/24/2019      Reactions   Morphine Sulfate Rash   This per the patient's referral from her PCP.           Medication List       Accurate as of May 24, 2019 11:59 PM. If you have any questions, ask your nurse or doctor.        acetaminophen 500 MG tablet  Commonly known as: TYLENOL  Take 500 mg by mouth at bedtime and may repeat dose one time if needed.   ALKA-SELTZER PLUS COLD & COUGH PO  Take 1 tablet by mouth daily as needed (cold symptoms.).   aspirin EC 81 MG tablet  Take 1 tablet (81 mg total) by mouth daily.   CALCIUM 500 + D3 PO  Take 1 tablet by mouth at bedtime.   levothyroxine 25 MCG tablet  Commonly known as: SYNTHROID  Take 25 mcg by mouth daily before breakfast.  loratadine 10 MG tablet  Commonly known as: CLARITIN  Take 10 mg by mouth daily.   metroNIDAZOLE 500 MG tablet  Commonly known as: Flagyl  Take 2 tablets (1,000 mg total) by mouth as directed. Take 2 flagyl 500mg  tablets at 2 pm, 3pm,10pm.  Started by: Virl Cagey, MD   multivitamin with minerals Tabs tablet  Take 1 tablet by mouth at bedtime.   neomycin 500 MG tablet  Commonly known as: MYCIFRADIN  Take 2 tablets (1,000 mg total) by mouth as directed. Take 2 neomycin 500mg  tablets at 2 pm, 3pm,10pm.  Started by: Virl Cagey, MD   psyllium 58.6 % powder  Commonly known as: METAMUCIL  Take 1 packet by mouth daily. Mixed with OJ   timolol 0.5 % ophthalmic solution  Commonly known as: TIMOPTIC  Place 1 drop into both eyes daily.       ROS:  A comprehensive review of systems was negative except for: Genitourinary: positive for frequency  Musculoskeletal: positive for back pain, neck pain and stiff joints   Neurological: positive for dizziness  Blood pressure (!) 154/72, pulse 82, temperature 98.2 F (36.8 C), temperature source Oral, resp. rate 16, height 5\' 3"  (1.6 m), weight 140 lb (63.5 kg), SpO2 97 %.  Physical Exam  Vitals reviewed.  Constitutional:  Appearance: Normal appearance.  HENT:  Head: Normocephalic and atraumatic.  Nose: Nose normal.  Mouth/Throat:  Mouth: Mucous membranes are moist.  Eyes:  Extraocular Movements: Extraocular movements intact.  Pupils: Pupils are equal, round, and reactive to light.  Cardiovascular:  Rate and Rhythm: Normal rate and regular rhythm.  Pulmonary:  Effort: Pulmonary effort is normal.  Breath sounds: Normal breath sounds.  Abdominal:  General: There is no distension.  Palpations: Abdomen is soft.  Tenderness: There is no abdominal tenderness.  Musculoskeletal:  General: No swelling.  Skin:  General: Skin is warm and dry.  Neurological:  General: No focal deficit present.  Mental Status: She is alert and oriented to person, place, and time.  Psychiatric:  Mood and Affect: Mood normal.  Behavior: Behavior normal.  Thought Content: Thought content normal.  Judgment: Judgment normal.   Results:  CT a/p - personally reviewed, area in the ascending colon with wall thickening  IMPRESSION:  1. Circumferential wall thickening in the ascending colon just  proximal to the hepatic flexure, compatible with tumor found at  endoscopy. No findings of metastatic disease.  2. Abnormal enhancement/wall thickening in the left kidney upper pole calyces/collecting system. Possibilities include urothelial tumor or local irritation from a tiny calculus. Urology referral  recommended for further workup.  3. 1.2 by 0.6 by 0.7 cm fluid density lesion in the head of the pancreas. This could be a small postinflammatory cystic lesion or  intraductal papillary mucinous neoplasm. Pancreatic protocol MRI is  recommended in 2 years time to follow up this  finding. This Recommendation follows ACR consensus guidelines: Management of  Incidental Pancreatic Cysts: A White Paper of the ACR Incidental  Findings Committee. J Am Coll Radiol [id number].  4. Mild bilateral foraminal impingement at L5-S1 due to disc bulge  spurring.  5. Aortic Atherosclerosis (ICD10-I70.0).  Pathology:  FINAL MICROSCOPIC DIAGNOSIS:   A. COLON, HEPATIC FLEXURE, BIOPSY:  - Adenocarcinoma  - See comment  Assessment & Plan:  Patricia Hurst is a 74 y.o. female with an ascending colon cancer and no obvious signs of metastatic disease on her CT scan. She will need to undergo a partial colectomy. We discussed trying  to doing this laparoscopically if possible but that her prior surgeries may prevent this, and we discussed that we would want to ensure a good cancer surgery. We discussed the risk of bleeding, infection, anastomotic leak, injury to other organs or the ureter. She expressed understanding. We discussed that her stage would not be known until the pathology returns, and that she would be referred to Oncology for further treatment options.  Dr. Laural Golden had reached out to Dr. Alyson Ingles regarding the concern for the left kidney abnormality. Dr. Alyson Ingles noted a stone and says this is related to a stone and not tumor.  There is also a question of a small area on the pancreas that will need surveillance MRI, and I will ensure Dr. Laural Golden is planning on doing this versus Oncology.  Colon Preparation:  -Buy from the Store:  Miralax bottle [phone]).  Gatorade 64 oz (not red).  Dulcolax tablets.  The Day Prior to Surgery:  Take 4 ducolax tablets at 7am with water.  Drink plenty of clear liquids all day to avoid dehydration, no solid food.  Mix the bottle of Miralax and 64 oz of Gatorade and drink this mixture starting at 10am. Drink it gradually over the next few hours, 8 ounces every 15-30 minutes until it is gone. Finish this by 2pm.  Take 2 neomycin 500mg  tablets and 2  metronidazole 500mg  tablets at 2 pm.  Take 2 neomycin 500mg  tablets and 2 metronidazole 500mg  tablets at 3pm.  Take 2 neomycin 500mg  tablets and 2 metronidazole 500mg  tablets at 10pm.  Do not eat or drink anything after midnight the night before your surgery.  Do not eat or drink anything that morning, and take medications as instructed by the hospital staff on your preoperative visit.  Will plan for the upcoming weeks. Discussed COVID testing and post operative course.  Will do an ECHO to have in our system, given the prior report of some mitral regurgitation. This needs to be scheduled prior to her surgery.        Future Appointments  Date Time Provider [place]  05/31/2019 9:00 AM [facility]-SCREENING [facility] None  05/31/2019 9:30 AM [facility] PAT 2 [facility] None  05/31/2019 10:30 AM [facility] - ECHO 1 OUTPATIENT [facility]ARDI[facility] [place] [facility]   All questions were answered to the satisfaction of the patient.  Virl Cagey  05/28/2019, 1:02 PM

## 2019-05-30 NOTE — Patient Instructions (Addendum)
Your procedure is scheduled on: 06/04/2019  Report to [facility] at  7:30   AM.  Call this number if you have problems the morning of surgery: [phone]   Remember:   Do not Eat or Drink after midnight              Take Neomycin, Flagyl and bowel prep as directed by Dr Constance Haw  :  Take these medicines the morning of surgery with A SIP OF WATER: levothyroxine and Claritin   Do not wear jewelry, make-up or nail polish.  Do not wear lotions, powders, or perfumes. You may wear deodorant.  Do not shave 48 hours prior to surgery. Men may shave face and neck.  Do not bring valuables to the hospital.  Contacts, dentures or bridgework may not be worn into surgery.  Leave suitcase in the car. After surgery it may be brought to your room.  For patients admitted to the hospital, checkout time is 11:00 AM the day of discharge.   Patients discharged the day of surgery will not be allowed to drive home.    Special Instructions: Shower using CHG night before surgery and shower the day of surgery use CHG.  Use special wash - you have one bottle of CHG for all showers.  You should use approximately 1/2 of the bottle for each shower.  Laparoscopic Colectomy Laparoscopic colectomy is surgery to remove part or all of the large intestine (colon). This procedure may be used to treat several conditions, including:  Inflammation and infection of the colon (diverticulitis).  Tumors or masses in the colon.  Inflammatory bowel disease, such as Crohn disease or ulcerative colitis. Colectomy is an option when symptoms cannot be controlled with medicines.  Bleeding from the colon that cannot be controlled by another method.  Blockage or obstruction of the colon. Tell a health care provider about:  Any allergies you have.  All medicines you are taking, including vitamins, herbs, eye drops, creams, and over-the-counter medicines.  Any problems you or family members have had with anesthetic  medicines.  Any blood disorders you have.  Any surgeries you have had.  Any medical conditions you have. What are the risks? Generally, this is a safe procedure. However, problems may occur, including:  Infection.  Bleeding.  Allergic reactions to medicines or dyes.  Damage to other structures or organs.  Leaking from where the colon was sewn together.  Future blockage of the small intestines from scar tissue. Another surgery may be needed to repair this.  Needing to convert to an open procedure. Complications such as damage to other organs or excessive bleeding may require the surgeon to convert from a laparoscopic procedure to an open procedure. This involves making a larger incision in the abdomen. What happens before the procedure? Staying hydrated Follow instructions from your health care provider about hydration, which may include:  Up to 2 hours before the procedure - you may continue to drink clear liquids, such as water, clear fruit juice, black coffee, and plain tea. Eating and drinking restrictions Follow instructions from your health care provider about eating and drinking, which may include:  8 hours before the procedure - stop eating heavy meals, meals with high fiber, or foods such as meat, fried foods, or fatty foods.  6 hours before the procedure - stop eating light meals or foods, such as toast or cereal.  6 hours before the procedure - stop drinking milk or drinks that contain milk.  2 hours before the  procedure - stop drinking clear liquids. Medicines  Ask your health care provider about: ? Changing or stopping your regular medicines. This is especially important if you are taking diabetes medicines or blood thinners. ? Taking medicines such as aspirin and ibuprofen. These medicines can thin your blood. Do not take these medicines before your procedure if your health care provider instructs you not to.  You may be given antibiotic medicine to clean out  bacteria from your colon. Follow the directions carefully and take the medicine at the correct time. General instructions  You may be prescribed an oral bowel prep to clean out your colon in preparation for the surgery: ? Follow instructions from your health care provider about how to do this. ? Do not eat or drink anything else after you have started the bowel prep, unless your health care provider tells you it is safe to do so.  Do not use any products that contain nicotine or tobacco, such as cigarettes and e-cigarettes. If you need help quitting, ask your health care provider. What happens during the procedure?  To reduce your risk of infection: ? Your health care team will wash or sanitize their hands. ? Your skin will be washed with soap.  An IV tube will be inserted into one of your veins to deliver fluid and medication.  You will be given one of the following: ? A medicine to help you relax (sedative). ? A medicine to make you fall asleep (general anesthetic).  Small monitors will be connected to your body. They will be used to check your heart, blood pressure, and oxygen level.  A breathing tube may be placed into your lungs during the procedure.  A thin, flexible tube (catheter) will be placed into your bladder to drain urine.  A tube may be placed through your nose and into your stomach to drain stomach fluids (nasogastric tube, or NG tube).  Your abdomen will be filled with air so it expands. This gives the surgeon more room to operate and makes your organs easier to see.  Several small cuts (incisions) will be made in your abdomen.  A thin, lighted tube with a tiny camera on the end (laparoscope) will be put through one of the small incisions. The camera on the laparoscope will send a picture to a computer screen in the operating room. This will give the surgeon a good view inside your abdomen.  Hollow tubes will be put through the other small incisions in your abdomen.  The tools that are needed for the procedure will be put through these tubes.  Clamps or staples will be put on both ends of the diseased part of the colon.  The part of the intestine between the clamps or staples will be removed.  If possible, the ends of the healthy colon that remain will be stitched (sutured) or stapled together to allow your body to pass waste (stool).  Sometimes, the remaining colon cannot be stitched back together. If this is the case, a colostomy will be needed. If you need a colostomy: ? An opening to the outside of your body (stoma) will be made through your abdomen. ? The end of your colon will be brought to the opening. It will be stitched to the skin. ? A bag will be attached to the opening. Stool will drain into this removable bag. ? The colostomy may be temporary or permanent.  The incisions from the colectomy will be closed with sutures or staples. The procedure may  vary among health care providers and hospitals. What happens after the procedure?  Your blood pressure, heart rate, breathing rate, and blood oxygen level will be monitored until the medicines you were given have worn off.  You will receive fluids through an IV tube until your bowels start to work properly.  Once your bowels are working again, you will be given clear liquids first and then solid food as tolerated.  You will be given medicines to control your pain and nausea, if needed.  Do not drive for 24 hours if you were given a sedative. This information is not intended to replace advice given to you by your health care provider. Make sure you discuss any questions you have with your health care provider. Document Revised: 03/25/2017 Document Reviewed: 01/12/2016 Elsevier Patient Education  [place].  Laparoscopic Colectomy, Care After This sheet gives you information about how to care for yourself after your procedure. Your health care provider may also give you more specific  instructions. If you have problems or questions, contact your health care provider. What can I expect after the procedure? After your procedure, it is common to have the following:  Pain in your abdomen, especially in the incision areas. You will be given medicine to control the pain.  Tiredness. This is a normal part of the recovery process. Your energy level will return to normal over the next several weeks.  Changes in your bowel movements, such as constipation or needing to go more often. Talk with your health care provider about how to manage this. Follow these instructions at home: Medicines  Take over-the-counter and prescription medicines only as told by your health care provider.  Do not drive or use heavy machinery while taking prescription pain medicine.  Do not drink alcohol while taking prescription pain medicine.  If you were prescribed an antibiotic medicine, use it as told by your health care provider. Do not stop using the antibiotic even if you start to feel better. Incision care   Follow instructions from your health care provider about how to take care of your incision areas. Make sure you: ? Keep your incisions clean and dry. ? Wash your hands with soap and water before and after applying medicine to the areas, and before and after changing your bandage (dressing). If soap and water are not available, use hand sanitizer. ? Change your dressing as told by your health care provider. ? Leave stitches (sutures), skin glue, or adhesive strips in place. These skin closures may need to stay in place for 2 weeks or longer. If adhesive strip edges start to loosen and curl up, you may trim the loose edges. Do not remove adhesive strips completely unless your health care provider tells you to do that.  Do not wear tight clothing over the incisions. Tight clothing may rub and irritate the incision areas, which may cause the incisions to open.  Do not take baths, swim, or use a  hot tub until your health care provider approves. Ask your health care provider if you can take showers. You may only be allowed to take sponge baths for bathing.  Check your incision area every day for signs of infection. Check for: ? More redness, swelling, or pain. ? More fluid or blood. ? Warmth. ? Pus or a bad smell. Activity  Avoid lifting anything that is heavier than 10 lb (4.5 kg) for 2 weeks or until your health care provider says it is okay.  You may resume normal  activities as told by your health care provider. Ask your health care provider what activities are safe for you.  Take rest breaks during the day as needed. Eating and drinking  Follow instructions from your health care provider about what you can eat after surgery.  To prevent or treat constipation while you are taking prescription pain medicine, your health care provider may recommend that you: ? Drink enough fluid to keep your urine clear or pale yellow. ? Take over-the-counter or prescription medicines. ? Eat foods that are high in fiber, such as fresh fruits and vegetables, whole grains, and beans. ? Limit foods that are high in fat and processed sugars, such as fried and sweet foods. General instructions  Ask your health care provider when you will need an appointment to get your sutures or staples removed.  Keep all follow-up visits as told by your health care provider. This is important. Contact a health care provider if:  You have more redness, swelling, or pain around your incisions.  You have more fluid or blood coming from the incisions.  Your incisions feel warm to the touch.  You have pus or a bad smell coming from your incisions or your dressing.  You have a fever.  You have an incision that breaks open (edges not staying together) after sutures or staples have been removed. Get help right away if:  You develop a rash.  You have chest pain or difficulty breathing.  You have pain or  swelling in your legs.  You feel light-headed or you faint.  Your abdomen swells (becomes distended).  You have nausea or vomiting.  You have blood in your stool (feces). This information is not intended to replace advice given to you by your health care provider. Make sure you discuss any questions you have with your health care provider. Document Revised: 12/30/2017 Document Reviewed: 01/12/2016 Elsevier Patient Education  2020 [place] Anesthesia, Adult, Care After This sheet gives you information about how to care for yourself after your procedure. Your health care provider may also give you more specific instructions. If you have problems or questions, contact your health care provider. What can I expect after the procedure? After the procedure, the following side effects are common:  Pain or discomfort at the IV site.  Nausea.  Vomiting.  Sore throat.  Trouble concentrating.  Feeling cold or chills.  Weak or tired.  Sleepiness and fatigue.  Soreness and body aches. These side effects can affect parts of the body that were not involved in surgery. Follow these instructions at home:  For at least 24 hours after the procedure:  Have a responsible adult stay with you. It is important to have someone help care for you until you are awake and alert.  Rest as needed.  Do not: ? Participate in activities in which you could fall or become injured. ? Drive. ? Use heavy machinery. ? Drink alcohol. ? Take sleeping pills or medicines that cause drowsiness. ? Make important decisions or sign legal documents. ? Take care of children on your own. Eating and drinking  Follow any instructions from your health care provider about eating or drinking restrictions.  When you feel hungry, start by eating small amounts of foods that are soft and easy to digest (bland), such as toast. Gradually return to your regular diet.  Drink enough fluid to keep your urine  pale yellow.  If you vomit, rehydrate by drinking water, juice, or clear broth. General instructions  If you have sleep apnea, surgery and certain medicines can increase your risk for breathing problems. Follow instructions from your health care provider about wearing your sleep device: ? Anytime you are sleeping, including during daytime naps. ? While taking prescription pain medicines, sleeping medicines, or medicines that make you drowsy.  Return to your normal activities as told by your health care provider. Ask your health care provider what activities are safe for you.  Take over-the-counter and prescription medicines only as told by your health care provider.  If you smoke, do not smoke without supervision.  Keep all follow-up visits as told by your health care provider. This is important. Contact a health care provider if:  You have nausea or vomiting that does not get better with medicine.  You cannot eat or drink without vomiting.  You have pain that does not get better with medicine.  You are unable to pass urine.  You develop a skin rash.  You have a fever.  You have redness around your IV site that gets worse. Get help right away if:  You have difficulty breathing.  You have chest pain.  You have blood in your urine or stool, or you vomit blood. Summary  After the procedure, it is common to have a sore throat or nausea. It is also common to feel tired.  Have a responsible adult stay with you for the first 24 hours after general anesthesia. It is important to have someone help care for you until you are awake and alert.  When you feel hungry, start by eating small amounts of foods that are soft and easy to digest (bland), such as toast. Gradually return to your regular diet.  Drink enough fluid to keep your urine pale yellow.  Return to your normal activities as told by your health care provider. Ask your health care provider what activities are safe for  you. This information is not intended to replace advice given to you by your health care provider. Make sure you discuss any questions you have with your health care provider. Document Revised: 04/15/2017 Document Reviewed: 11/26/2016 Elsevier Patient Education  [place].

## 2019-05-31 ENCOUNTER — Other Ambulatory Visit: Payer: Self-pay

## 2019-05-31 ENCOUNTER — Encounter (HOSPITAL_COMMUNITY): Payer: Self-pay

## 2019-05-31 ENCOUNTER — Ambulatory Visit (HOSPITAL_COMMUNITY)
Admission: RE | Admit: 2019-05-31 | Discharge: 2019-05-31 | Disposition: A | Discharge status: Home / Self Care | Payer: Medicare HMO | Source: Ambulatory Visit | Attending: General Surgery | Admitting: General Surgery

## 2019-05-31 ENCOUNTER — Encounter (HOSPITAL_COMMUNITY)
Admission: RE | Admit: 2019-05-31 | Discharge: 2019-05-31 | Disposition: A | Discharge status: Home / Self Care | Payer: Medicare HMO | Source: Ambulatory Visit | Attending: General Surgery | Admitting: General Surgery

## 2019-05-31 ENCOUNTER — Other Ambulatory Visit (HOSPITAL_COMMUNITY)
Admission: RE | Admit: 2019-05-31 | Discharge: 2019-05-31 | Disposition: A | Discharge status: Home / Self Care | Payer: Medicare HMO | Source: Ambulatory Visit | Attending: General Surgery | Admitting: General Surgery

## 2019-05-31 DIAGNOSIS — Y838 Other surgical procedures as the cause of abnormal reaction of the patient, or of later complication, without mention of misadventure at the time of the procedure: Secondary | ICD-10-CM | POA: Diagnosis not present

## 2019-05-31 DIAGNOSIS — I340 Nonrheumatic mitral (valve) insufficiency: Secondary | ICD-10-CM | POA: Diagnosis present

## 2019-05-31 DIAGNOSIS — Z808 Family history of malignant neoplasm of other organs or systems: Secondary | ICD-10-CM | POA: Diagnosis not present

## 2019-05-31 DIAGNOSIS — I5189 Other ill-defined heart diseases: Secondary | ICD-10-CM | POA: Diagnosis not present

## 2019-05-31 DIAGNOSIS — N200 Calculus of kidney: Secondary | ICD-10-CM | POA: Diagnosis present

## 2019-05-31 DIAGNOSIS — Z8349 Family history of other endocrine, nutritional and metabolic diseases: Secondary | ICD-10-CM | POA: Diagnosis not present

## 2019-05-31 DIAGNOSIS — Z20822 Contact with and (suspected) exposure to covid-19: Secondary | ICD-10-CM | POA: Insufficient documentation

## 2019-05-31 DIAGNOSIS — D62 Acute posthemorrhagic anemia: Secondary | ICD-10-CM | POA: Diagnosis not present

## 2019-05-31 DIAGNOSIS — Z79890 Hormone replacement therapy (postmenopausal): Secondary | ICD-10-CM | POA: Diagnosis not present

## 2019-05-31 DIAGNOSIS — Z803 Family history of malignant neoplasm of breast: Secondary | ICD-10-CM | POA: Diagnosis not present

## 2019-05-31 DIAGNOSIS — K921 Melena: Secondary | ICD-10-CM | POA: Diagnosis not present

## 2019-05-31 DIAGNOSIS — Z9049 Acquired absence of other specified parts of digestive tract: Secondary | ICD-10-CM | POA: Diagnosis not present

## 2019-05-31 DIAGNOSIS — E559 Vitamin D deficiency, unspecified: Secondary | ICD-10-CM | POA: Diagnosis present

## 2019-05-31 DIAGNOSIS — Z841 Family history of disorders of kidney and ureter: Secondary | ICD-10-CM | POA: Diagnosis not present

## 2019-05-31 DIAGNOSIS — C182 Malignant neoplasm of ascending colon: Secondary | ICD-10-CM

## 2019-05-31 DIAGNOSIS — I5030 Unspecified diastolic (congestive) heart failure: Secondary | ICD-10-CM

## 2019-05-31 DIAGNOSIS — C772 Secondary and unspecified malignant neoplasm of intra-abdominal lymph nodes: Secondary | ICD-10-CM | POA: Diagnosis present

## 2019-05-31 DIAGNOSIS — Q438 Other specified congenital malformations of intestine: Secondary | ICD-10-CM | POA: Diagnosis not present

## 2019-05-31 DIAGNOSIS — Z825 Family history of asthma and other chronic lower respiratory diseases: Secondary | ICD-10-CM | POA: Diagnosis not present

## 2019-05-31 DIAGNOSIS — E039 Hypothyroidism, unspecified: Secondary | ICD-10-CM | POA: Diagnosis present

## 2019-05-31 DIAGNOSIS — Z90710 Acquired absence of both cervix and uterus: Secondary | ICD-10-CM | POA: Diagnosis not present

## 2019-05-31 DIAGNOSIS — Z8249 Family history of ischemic heart disease and other diseases of the circulatory system: Secondary | ICD-10-CM | POA: Diagnosis not present

## 2019-05-31 DIAGNOSIS — Z7982 Long term (current) use of aspirin: Secondary | ICD-10-CM | POA: Diagnosis not present

## 2019-05-31 DIAGNOSIS — N179 Acute kidney failure, unspecified: Secondary | ICD-10-CM | POA: Diagnosis not present

## 2019-05-31 DIAGNOSIS — E7800 Pure hypercholesterolemia, unspecified: Secondary | ICD-10-CM | POA: Diagnosis present

## 2019-05-31 DIAGNOSIS — K660 Peritoneal adhesions (postprocedural) (postinfection): Secondary | ICD-10-CM | POA: Diagnosis present

## 2019-05-31 DIAGNOSIS — Z833 Family history of diabetes mellitus: Secondary | ICD-10-CM | POA: Diagnosis not present

## 2019-05-31 DIAGNOSIS — E860 Dehydration: Secondary | ICD-10-CM | POA: Diagnosis not present

## 2019-05-31 LAB — CBC
HCT: 34.4 % — ABNORMAL LOW (ref 36.0–46.0)
Hemoglobin: 12.1 g/dL (ref 12.0–15.0)
MCH: 32.1 pg (ref 26.0–34.0)
MCHC: 35.2 g/dL (ref 30.0–36.0)
MCV: 91.2 fL (ref 80.0–100.0)
Platelets: 174 10*3/uL (ref 150–400)
RBC: 3.77 MIL/uL — ABNORMAL LOW (ref 3.87–5.11)
RDW: 11.8 % (ref 11.5–15.5)
WBC: 4.4 10*3/uL (ref 4.0–10.5)
nRBC: 0 % (ref 0.0–0.2)

## 2019-05-31 LAB — BASIC METABOLIC PANEL
Anion gap: 11 (ref 5–15)
BUN: 11 mg/dL (ref 8–23)
CO2: 26 mmol/L (ref 22–32)
Calcium: 9.4 mg/dL (ref 8.9–10.3)
Chloride: 105 mmol/L (ref 98–111)
Creatinine, Ser: 0.99 mg/dL (ref 0.44–1.00)
GFR calc Af Amer: >60 mL/min (ref >60)
GFR calc non Af Amer: 57 mL/min — ABNORMAL LOW (ref >60)
Glucose, Bld: 102 mg/dL — ABNORMAL HIGH (ref 70–99)
Potassium: 4.6 mmol/L (ref 3.5–5.1)
Sodium: 142 mmol/L (ref 135–145)

## 2019-05-31 LAB — ECHOCARDIOGRAM COMPLETE
Height: 62 in
Weight: 2240 oz

## 2019-05-31 LAB — SARS CORONAVIRUS 2 (TAT 6-24 HRS): SARS Coronavirus 2: NEGATIVE

## 2019-05-31 LAB — TYPE AND SCREEN
ABO/RH(D): B NEG
Antibody Screen: NEGATIVE

## 2019-05-31 NOTE — Progress Notes (Signed)
*  PRELIMINARY RESULTS* Echocardiogram 2D Echocardiogram has been performed.  Patricia Hurst 05/31/2019, 11:22 AM

## 2019-06-04 ENCOUNTER — Other Ambulatory Visit: Payer: Self-pay

## 2019-06-04 ENCOUNTER — Encounter (HOSPITAL_COMMUNITY): Payer: Self-pay | Admitting: General Surgery

## 2019-06-04 ENCOUNTER — Inpatient Hospital Stay (HOSPITAL_COMMUNITY)
Admission: RE | Admit: 2019-06-04 | Discharge: 2019-06-08 | DRG: 330 | Disposition: A | Discharge status: Home / Self Care | Payer: Medicare HMO | Attending: General Surgery | Admitting: General Surgery

## 2019-06-04 ENCOUNTER — Inpatient Hospital Stay (HOSPITAL_COMMUNITY): Payer: Medicare HMO | Admitting: Anesthesiology

## 2019-06-04 ENCOUNTER — Encounter (HOSPITAL_COMMUNITY)
Admission: RE | Disposition: A | Discharge status: Home / Self Care | Payer: Self-pay | Source: Home / Self Care | Attending: General Surgery

## 2019-06-04 DIAGNOSIS — Z9049 Acquired absence of other specified parts of digestive tract: Secondary | ICD-10-CM

## 2019-06-04 DIAGNOSIS — E860 Dehydration: Secondary | ICD-10-CM | POA: Diagnosis not present

## 2019-06-04 DIAGNOSIS — Z833 Family history of diabetes mellitus: Secondary | ICD-10-CM | POA: Diagnosis not present

## 2019-06-04 DIAGNOSIS — C189 Malignant neoplasm of colon, unspecified: Secondary | ICD-10-CM | POA: Diagnosis present

## 2019-06-04 DIAGNOSIS — D62 Acute posthemorrhagic anemia: Secondary | ICD-10-CM | POA: Diagnosis not present

## 2019-06-04 DIAGNOSIS — C772 Secondary and unspecified malignant neoplasm of intra-abdominal lymph nodes: Secondary | ICD-10-CM | POA: Diagnosis present

## 2019-06-04 DIAGNOSIS — Z803 Family history of malignant neoplasm of breast: Secondary | ICD-10-CM | POA: Diagnosis not present

## 2019-06-04 DIAGNOSIS — Z79899 Other long term (current) drug therapy: Secondary | ICD-10-CM

## 2019-06-04 DIAGNOSIS — N200 Calculus of kidney: Secondary | ICD-10-CM | POA: Diagnosis present

## 2019-06-04 DIAGNOSIS — Z8249 Family history of ischemic heart disease and other diseases of the circulatory system: Secondary | ICD-10-CM

## 2019-06-04 DIAGNOSIS — E039 Hypothyroidism, unspecified: Secondary | ICD-10-CM | POA: Diagnosis present

## 2019-06-04 DIAGNOSIS — K921 Melena: Secondary | ICD-10-CM | POA: Diagnosis not present

## 2019-06-04 DIAGNOSIS — Z841 Family history of disorders of kidney and ureter: Secondary | ICD-10-CM

## 2019-06-04 DIAGNOSIS — Z79890 Hormone replacement therapy (postmenopausal): Secondary | ICD-10-CM | POA: Diagnosis not present

## 2019-06-04 DIAGNOSIS — Z7982 Long term (current) use of aspirin: Secondary | ICD-10-CM

## 2019-06-04 DIAGNOSIS — N179 Acute kidney failure, unspecified: Secondary | ICD-10-CM | POA: Diagnosis not present

## 2019-06-04 DIAGNOSIS — Z90710 Acquired absence of both cervix and uterus: Secondary | ICD-10-CM

## 2019-06-04 DIAGNOSIS — E7800 Pure hypercholesterolemia, unspecified: Secondary | ICD-10-CM | POA: Diagnosis present

## 2019-06-04 DIAGNOSIS — Y838 Other surgical procedures as the cause of abnormal reaction of the patient, or of later complication, without mention of misadventure at the time of the procedure: Secondary | ICD-10-CM | POA: Diagnosis not present

## 2019-06-04 DIAGNOSIS — Z808 Family history of malignant neoplasm of other organs or systems: Secondary | ICD-10-CM

## 2019-06-04 DIAGNOSIS — K660 Peritoneal adhesions (postprocedural) (postinfection): Secondary | ICD-10-CM | POA: Diagnosis present

## 2019-06-04 DIAGNOSIS — Z825 Family history of asthma and other chronic lower respiratory diseases: Secondary | ICD-10-CM | POA: Diagnosis not present

## 2019-06-04 DIAGNOSIS — C182 Malignant neoplasm of ascending colon: Principal | ICD-10-CM | POA: Diagnosis present

## 2019-06-04 DIAGNOSIS — E559 Vitamin D deficiency, unspecified: Secondary | ICD-10-CM | POA: Diagnosis present

## 2019-06-04 DIAGNOSIS — Q438 Other specified congenital malformations of intestine: Secondary | ICD-10-CM | POA: Diagnosis not present

## 2019-06-04 DIAGNOSIS — I340 Nonrheumatic mitral (valve) insufficiency: Secondary | ICD-10-CM | POA: Diagnosis present

## 2019-06-04 DIAGNOSIS — Z8349 Family history of other endocrine, nutritional and metabolic diseases: Secondary | ICD-10-CM

## 2019-06-04 HISTORY — PX: LAPAROSCOPIC PARTIAL COLECTOMY: SHX5907

## 2019-06-04 SURGERY — LAPAROSCOPIC PARTIAL COLECTOMY
Anesthesia: General | Laterality: Right

## 2019-06-04 MED ORDER — BUPIVACAINE LIPOSOME 1.3 % IJ SUSP
INTRAMUSCULAR | Status: AC
  Filled 2019-06-04: qty 20

## 2019-06-04 MED ORDER — LIDOCAINE 2% (20 MG/ML) 5 ML SYRINGE
INTRAMUSCULAR | Status: AC
  Filled 2019-06-04: qty 5

## 2019-06-04 MED ORDER — ALVIMOPAN 12 MG PO CAPS
ORAL_CAPSULE | ORAL | Status: AC
  Filled 2019-06-04: qty 1

## 2019-06-04 MED ORDER — SODIUM CHLORIDE 0.9 % IV SOLN
INTRAVENOUS | Status: AC
  Filled 2019-06-04: qty 2

## 2019-06-04 MED ORDER — HEPARIN SODIUM (PORCINE) 5000 UNIT/ML IJ SOLN
5000.0000 [IU] | Freq: Once | INTRAMUSCULAR | Status: AC
  Administered 2019-06-04: 5000 [IU] via SUBCUTANEOUS

## 2019-06-04 MED ORDER — LACTATED RINGERS IV SOLN: Freq: Once | INTRAVENOUS | Status: AC

## 2019-06-04 MED ORDER — FENTANYL CITRATE (PF) 250 MCG/5ML IJ SOLN
INTRAMUSCULAR | Status: AC
  Filled 2019-06-04: qty 5

## 2019-06-04 MED ORDER — DIPHENHYDRAMINE HCL 12.5 MG/5ML PO ELIX: 12.5000 mg | ORAL_SOLUTION | Freq: Four times a day (QID) | ORAL | Status: DC | PRN

## 2019-06-04 MED ORDER — DOCUSATE SODIUM 100 MG PO CAPS
100.0000 mg | ORAL_CAPSULE | Freq: Two times a day (BID) | ORAL | Status: DC
  Administered 2019-06-04: 100 mg via ORAL
  Filled 2019-06-04 (×4): qty 1

## 2019-06-04 MED ORDER — BUPIVACAINE LIPOSOME 1.3 % IJ SUSP
INTRAMUSCULAR | Status: DC | PRN
  Administered 2019-06-04: 20 mL

## 2019-06-04 MED ORDER — OXYCODONE HCL 5 MG PO TABS: 5.0000 mg | ORAL_TABLET | ORAL | Status: DC | PRN

## 2019-06-04 MED ORDER — LIDOCAINE HCL (CARDIAC) PF 100 MG/5ML IV SOSY: PREFILLED_SYRINGE | INTRAVENOUS | Status: DC | PRN

## 2019-06-04 MED ORDER — LIDOCAINE 2% (20 MG/ML) 5 ML SYRINGE
INTRAMUSCULAR | Status: DC | PRN
  Administered 2019-06-04: 60 mg via INTRAVENOUS

## 2019-06-04 MED ORDER — ROCURONIUM BROMIDE 10 MG/ML (PF) SYRINGE
PREFILLED_SYRINGE | INTRAVENOUS | Status: AC
  Filled 2019-06-04: qty 10

## 2019-06-04 MED ORDER — CHLORHEXIDINE GLUCONATE CLOTH 2 % EX PADS
6.0000 | MEDICATED_PAD | Freq: Every day | CUTANEOUS | Status: DC
  Administered 2019-06-05 – 2019-06-06 (×2): 6 via TOPICAL

## 2019-06-04 MED ORDER — ENOXAPARIN SODIUM 40 MG/0.4ML ~~LOC~~ SOLN
40.0000 mg | SUBCUTANEOUS | Status: DC
  Administered 2019-06-05: 40 mg via SUBCUTANEOUS
  Filled 2019-06-04: qty 0.4

## 2019-06-04 MED ORDER — SUGAMMADEX SODIUM 200 MG/2ML IV SOLN
INTRAVENOUS | Status: DC | PRN
  Administered 2019-06-04: 200 mg via INTRAVENOUS

## 2019-06-04 MED ORDER — 0.9 % SODIUM CHLORIDE (POUR BTL) OPTIME
TOPICAL | Status: DC | PRN
  Administered 2019-06-04: 10:00:00 2000 mL

## 2019-06-04 MED ORDER — FENTANYL CITRATE (PF) 100 MCG/2ML IJ SOLN
INTRAMUSCULAR | Status: DC | PRN
  Administered 2019-06-04 (×3): 50 ug via INTRAVENOUS

## 2019-06-04 MED ORDER — ONDANSETRON HCL 4 MG/2ML IJ SOLN
INTRAMUSCULAR | Status: DC | PRN
  Administered 2019-06-04: 4 mg via INTRAVENOUS

## 2019-06-04 MED ORDER — GABAPENTIN 300 MG PO CAPS
300.0000 mg | ORAL_CAPSULE | ORAL | Status: AC
  Administered 2019-06-04: 300 mg via ORAL

## 2019-06-04 MED ORDER — LEVOTHYROXINE SODIUM 25 MCG PO TABS
25.0000 ug | ORAL_TABLET | Freq: Every day | ORAL | Status: DC
  Administered 2019-06-05 – 2019-06-08 (×4): 25 ug via ORAL
  Filled 2019-06-04 (×4): qty 1

## 2019-06-04 MED ORDER — ONDANSETRON 4 MG PO TBDP: 4.0000 mg | ORAL_TABLET | Freq: Four times a day (QID) | ORAL | Status: DC | PRN

## 2019-06-04 MED ORDER — CHLORHEXIDINE GLUCONATE CLOTH 2 % EX PADS: 6.0000 | MEDICATED_PAD | Freq: Once | CUTANEOUS | Status: DC

## 2019-06-04 MED ORDER — GABAPENTIN 300 MG PO CAPS
ORAL_CAPSULE | ORAL | Status: AC
  Filled 2019-06-04: qty 1

## 2019-06-04 MED ORDER — TIMOLOL MALEATE 0.5 % OP SOLN
1.0000 [drp] | Freq: Every day | OPHTHALMIC | Status: DC
  Administered 2019-06-05 – 2019-06-08 (×4): 1 [drp] via OPHTHALMIC
  Filled 2019-06-04: qty 5

## 2019-06-04 MED ORDER — HEPARIN SODIUM (PORCINE) 5000 UNIT/ML IJ SOLN
INTRAMUSCULAR | Status: AC
  Filled 2019-06-04: qty 1

## 2019-06-04 MED ORDER — LACTATED RINGERS IV SOLN: INTRAVENOUS | Status: DC

## 2019-06-04 MED ORDER — HYDROMORPHONE HCL 1 MG/ML IJ SOLN: 0.2500 mg | INTRAMUSCULAR | Status: DC | PRN

## 2019-06-04 MED ORDER — PROPOFOL 10 MG/ML IV BOLUS
INTRAVENOUS | Status: DC | PRN
  Administered 2019-06-04: 100 mg via INTRAVENOUS

## 2019-06-04 MED ORDER — ACETAMINOPHEN 500 MG PO TABS
1000.0000 mg | ORAL_TABLET | ORAL | Status: AC
  Administered 2019-06-04: 1000 mg via ORAL

## 2019-06-04 MED ORDER — ROCURONIUM BROMIDE 10 MG/ML (PF) SYRINGE
PREFILLED_SYRINGE | INTRAVENOUS | Status: DC | PRN
  Administered 2019-06-04: 40 mg via INTRAVENOUS
  Administered 2019-06-04: 10 mg via INTRAVENOUS

## 2019-06-04 MED ORDER — SIMETHICONE 80 MG PO CHEW
40.0000 mg | CHEWABLE_TABLET | Freq: Four times a day (QID) | ORAL | Status: DC | PRN
  Administered 2019-06-05 – 2019-06-06 (×2): 40 mg via ORAL
  Filled 2019-06-04 (×3): qty 1

## 2019-06-04 MED ORDER — ADULT MULTIVITAMIN W/MINERALS CH
1.0000 | ORAL_TABLET | Freq: Every day | ORAL | Status: DC
  Administered 2019-06-04 – 2019-06-07 (×3): 1 via ORAL
  Filled 2019-06-04 (×4): qty 1

## 2019-06-04 MED ORDER — DEXAMETHASONE SODIUM PHOSPHATE 10 MG/ML IJ SOLN
INTRAMUSCULAR | Status: DC | PRN
  Administered 2019-06-04: 5 mg via INTRAVENOUS

## 2019-06-04 MED ORDER — KETOROLAC TROMETHAMINE 15 MG/ML IJ SOLN
15.0000 mg | Freq: Four times a day (QID) | INTRAMUSCULAR | Status: AC
  Administered 2019-06-04: 15 mg via INTRAVENOUS
  Filled 2019-06-04: qty 1

## 2019-06-04 MED ORDER — SODIUM CHLORIDE 0.9 % IV SOLN
2.0000 g | INTRAVENOUS | Status: AC
  Administered 2019-06-04: 09:00:00 2 g via INTRAVENOUS

## 2019-06-04 MED ORDER — KETOROLAC TROMETHAMINE 15 MG/ML IJ SOLN
15.0000 mg | Freq: Four times a day (QID) | INTRAMUSCULAR | Status: DC | PRN
  Administered 2019-06-04 – 2019-06-07 (×6): 15 mg via INTRAVENOUS
  Filled 2019-06-04 (×7): qty 1

## 2019-06-04 MED ORDER — MEPERIDINE HCL 50 MG/ML IJ SOLN: 6.2500 mg | INTRAMUSCULAR | Status: DC | PRN

## 2019-06-04 MED ORDER — ZOLPIDEM TARTRATE 5 MG PO TABS: 5.0000 mg | ORAL_TABLET | Freq: Every evening | ORAL | Status: DC | PRN

## 2019-06-04 MED ORDER — PANTOPRAZOLE SODIUM 40 MG IV SOLR
40.0000 mg | Freq: Every day | INTRAVENOUS | Status: DC
  Administered 2019-06-04 – 2019-06-07 (×4): 40 mg via INTRAVENOUS
  Filled 2019-06-04 (×4): qty 40

## 2019-06-04 MED ORDER — ALVIMOPAN 12 MG PO CAPS
12.0000 mg | ORAL_CAPSULE | ORAL | Status: AC
  Administered 2019-06-04: 12 mg via ORAL

## 2019-06-04 MED ORDER — ONDANSETRON HCL 4 MG/2ML IJ SOLN: 4.0000 mg | Freq: Once | INTRAMUSCULAR | Status: DC | PRN

## 2019-06-04 MED ORDER — LACTATED RINGERS IV SOLN: INTRAVENOUS | Status: DC | PRN

## 2019-06-04 MED ORDER — ONDANSETRON HCL 4 MG/2ML IJ SOLN
4.0000 mg | Freq: Four times a day (QID) | INTRAMUSCULAR | Status: DC | PRN
  Administered 2019-06-04 – 2019-06-06 (×2): 4 mg via INTRAVENOUS
  Filled 2019-06-04 (×2): qty 2

## 2019-06-04 MED ORDER — DEXAMETHASONE SODIUM PHOSPHATE 10 MG/ML IJ SOLN
INTRAMUSCULAR | Status: AC
  Filled 2019-06-04: qty 1

## 2019-06-04 MED ORDER — GABAPENTIN 300 MG PO CAPS
300.0000 mg | ORAL_CAPSULE | Freq: Two times a day (BID) | ORAL | Status: DC
  Administered 2019-06-04 – 2019-06-08 (×6): 300 mg via ORAL
  Filled 2019-06-04 (×8): qty 1

## 2019-06-04 MED ORDER — METOPROLOL TARTRATE 5 MG/5ML IV SOLN: 5.0000 mg | Freq: Four times a day (QID) | INTRAVENOUS | Status: DC | PRN

## 2019-06-04 MED ORDER — ACETAMINOPHEN 500 MG PO TABS
1000.0000 mg | ORAL_TABLET | Freq: Four times a day (QID) | ORAL | Status: DC
  Administered 2019-06-04 – 2019-06-05 (×4): 1000 mg via ORAL
  Filled 2019-06-04 (×9): qty 2

## 2019-06-04 MED ORDER — HYDROMORPHONE HCL 1 MG/ML IJ SOLN
1.0000 mg | INTRAMUSCULAR | Status: DC | PRN
  Administered 2019-06-04: 1 mg via INTRAVENOUS
  Filled 2019-06-04: qty 1

## 2019-06-04 MED ORDER — PROPOFOL 10 MG/ML IV BOLUS
INTRAVENOUS | Status: AC
  Filled 2019-06-04: qty 20

## 2019-06-04 MED ORDER — ONDANSETRON HCL 4 MG/2ML IJ SOLN
INTRAMUSCULAR | Status: AC
  Filled 2019-06-04: qty 2

## 2019-06-04 MED ORDER — DIPHENHYDRAMINE HCL 50 MG/ML IJ SOLN: 12.5000 mg | Freq: Four times a day (QID) | INTRAMUSCULAR | Status: DC | PRN

## 2019-06-04 MED ORDER — LORATADINE 10 MG PO TABS
10.0000 mg | ORAL_TABLET | Freq: Every day | ORAL | Status: DC
  Administered 2019-06-05 – 2019-06-08 (×4): 10 mg via ORAL
  Filled 2019-06-04 (×4): qty 1

## 2019-06-04 MED ORDER — SODIUM CHLORIDE 0.9 % IV SOLN
2.0000 g | Freq: Two times a day (BID) | INTRAVENOUS | Status: AC
  Administered 2019-06-04 – 2019-06-06 (×5): 2 g via INTRAVENOUS
  Filled 2019-06-04 (×5): qty 2

## 2019-06-04 MED ORDER — ACETAMINOPHEN 500 MG PO TABS
ORAL_TABLET | ORAL | Status: AC
  Filled 2019-06-04: qty 2

## 2019-06-04 MED ORDER — ALVIMOPAN 12 MG PO CAPS
12.0000 mg | ORAL_CAPSULE | Freq: Two times a day (BID) | ORAL | Status: DC
  Administered 2019-06-05: 12 mg via ORAL
  Filled 2019-06-04 (×2): qty 1

## 2019-06-04 SURGICAL SUPPLY — 55 items
BAG HAMPER (MISCELLANEOUS) ×2 IMPLANT
CELLS DAT CNTRL 66122 CELL SVR (MISCELLANEOUS) ×1 IMPLANT
COVER LIGHT HANDLE STERIS (MISCELLANEOUS) ×4 IMPLANT
COVER WAND RF STERILE (DRAPES) ×2 IMPLANT
DRSG OPSITE POSTOP 4X8 (GAUZE/BANDAGES/DRESSINGS) ×2 IMPLANT
DRSG TEGADERM 2-3/8X2-3/4 SM (GAUZE/BANDAGES/DRESSINGS) ×6 IMPLANT
ELECT REM PT RETURN 9FT ADLT (ELECTROSURGICAL) ×2
ELECTRODE REM PT RTRN 9FT ADLT (ELECTROSURGICAL) ×1 IMPLANT
GAUZE 4X4 16PLY RFD (DISPOSABLE) ×2 IMPLANT
GAUZE SPONGE 4X4 12PLY STRL LF (GAUZE/BANDAGES/DRESSINGS) ×2 IMPLANT
GLOVE BIO SURGEON STRL SZ 6.5 (GLOVE) ×4 IMPLANT
GLOVE BIOGEL M 7.0 STRL (GLOVE) ×2 IMPLANT
GLOVE BIOGEL PI IND STRL 6.5 (GLOVE) ×2 IMPLANT
GLOVE BIOGEL PI IND STRL 7.0 (GLOVE) ×4 IMPLANT
GLOVE BIOGEL PI IND STRL 7.5 (GLOVE) ×1 IMPLANT
GLOVE BIOGEL PI INDICATOR 6.5 (GLOVE) ×2
GLOVE BIOGEL PI INDICATOR 7.0 (GLOVE) ×4
GLOVE BIOGEL PI INDICATOR 7.5 (GLOVE) ×1
GLOVE ECLIPSE 6.5 STRL STRAW (GLOVE) ×4 IMPLANT
GLOVE SURG SS PI 7.5 STRL IVOR (GLOVE) ×4 IMPLANT
GOWN STRL REUS W/TWL LRG LVL3 (GOWN DISPOSABLE) ×14 IMPLANT
INST SET LAPROSCOPIC AP (KITS) ×2 IMPLANT
INST SET MAJOR GENERAL (KITS) ×2 IMPLANT
KIT TURNOVER CYSTO (KITS) ×2 IMPLANT
LIGASURE IMPACT 36 18CM CVD LR (INSTRUMENTS) ×2 IMPLANT
LIGASURE LAP ATLAS 10MM 37CM (INSTRUMENTS) ×2 IMPLANT
MANIFOLD NEPTUNE II (INSTRUMENTS) ×2 IMPLANT
NEEDLE HYPO 18GX1.5 BLUNT FILL (NEEDLE) ×2 IMPLANT
NS IRRIG 1000ML POUR BTL (IV SOLUTION) ×6 IMPLANT
PACK COLON (CUSTOM PROCEDURE TRAY) ×2 IMPLANT
PAD ARMBOARD 7.5X6 YLW CONV (MISCELLANEOUS) ×2 IMPLANT
PENCIL HANDSWITCHING (ELECTRODE) ×2 IMPLANT
RELOAD PROXIMATE 75MM BLUE (ENDOMECHANICALS) ×4 IMPLANT
RTRCTR WOUND ALEXIS 18CM MED (MISCELLANEOUS) ×2
SET TUBE SMOKE EVAC HIGH FLOW (TUBING) ×2 IMPLANT
SHEET LAVH (DRAPES) ×2 IMPLANT
SLEEVE ENDOPATH XCEL 5M (ENDOMECHANICALS) ×2 IMPLANT
SPONGE GAUZE 2X2 8PLY STRL LF (GAUZE/BANDAGES/DRESSINGS) ×6 IMPLANT
SPONGE LAP 18X18 RF (DISPOSABLE) ×4 IMPLANT
STAPLER GUN LINEAR PROX 60 (STAPLE) ×2 IMPLANT
STAPLER PROXIMATE 75MM BLUE (STAPLE) ×2 IMPLANT
STAPLER VISISTAT (STAPLE) ×2 IMPLANT
SUT PDS AB CT VIOLET #0 27IN (SUTURE) ×4 IMPLANT
SUT PROLENE 2 0 SH 30 (SUTURE) IMPLANT
SUT SILK 3 0 SH CR/8 (SUTURE) ×4 IMPLANT
SUT VIC AB 3-0 SH 27 (SUTURE) ×1
SUT VIC AB 3-0 SH 27X BRD (SUTURE) ×1 IMPLANT
SYR 10ML LL (SYRINGE) ×2 IMPLANT
SYR 20ML LL LF (SYRINGE) ×4 IMPLANT
TRAY FOLEY MTR SLVR 16FR STAT (SET/KITS/TRAYS/PACK) ×2 IMPLANT
TROCAR ENDO BLADELESS 11MM (ENDOMECHANICALS) ×2 IMPLANT
TROCAR XCEL NON-BLD 5MMX100MML (ENDOMECHANICALS) ×2 IMPLANT
TROCAR XCEL UNIV SLVE 11M 100M (ENDOMECHANICALS) ×2 IMPLANT
WARMER LAPAROSCOPE (MISCELLANEOUS) ×2 IMPLANT
YANKAUER SUCT BULB TIP 10FT TU (MISCELLANEOUS) ×4 IMPLANT

## 2019-06-04 NOTE — Progress Notes (Signed)
[facility]  Updated Son, Floyce Stakes about surgery. Will keep him updated [phone].   Curlene Labrum, MD [facility] [address] [place], [place] [address] [id number] [phone] (office)

## 2019-06-04 NOTE — Interval H&P Note (Signed)
History and Physical Interval Note:  06/04/2019 8:58 AM  Patricia Hurst  has presented today for surgery, with the diagnosis of Ascending Colon Cancer.  The various methods of treatment have been discussed with the patient and family. After consideration of risks, benefits and other options for treatment, the patient has consented to  Procedure(s): LAPAROSCOPIC PARTIAL COLECTOMY, POSSIBLE OPEN (Right) as a surgical intervention.  The patient's history has been reviewed, patient examined, no change in status, stable for surgery.  I have reviewed the patient's chart and labs.  Questions were answered to the patient's satisfaction.    No additional questions.   Virl Cagey

## 2019-06-04 NOTE — Anesthesia Procedure Notes (Signed)
Procedure Name: Intubation Date/Time: 06/04/2019 9:12 AM Performed by: Lyda Jester, CRNA Pre-anesthesia Checklist: Patient identified, Patient being monitored, Timeout performed, Emergency Drugs available and Suction available Patient Re-evaluated:Patient Re-evaluated prior to induction Oxygen Delivery Method: Circle System Utilized Preoxygenation: Pre-oxygenation with 100% oxygen Induction Type: IV induction Ventilation: Mask ventilation without difficulty Laryngoscope Size: Miller and 2 Grade View: Grade II Tube type: Oral Tube size: 6.5 mm Number of attempts: 1 Airway Equipment and Method: stylet Placement Confirmation: ETT inserted through vocal cords under direct vision,  positive ETCO2 and breath sounds checked- equal and bilateral Secured at: 21 cm Tube secured with: Tape Dental Injury: Teeth and Oropharynx as per pre-operative assessment

## 2019-06-04 NOTE — Progress Notes (Signed)
[facility]  Updated husband Orene Desanctis regarding surgery.   Curlene Labrum, MD [facility] [address] [place], [place] [address] [id number] [phone] (office)

## 2019-06-04 NOTE — Transfer of Care (Signed)
Immediate Anesthesia Transfer of Care Note  Patient: Patricia Hurst  Procedure(s) Performed: LAPAROSCOPIC RIGHT HEMICOLECTOMY (Right )  Patient Location: PACU  Anesthesia Type:General  Level of Consciousness: awake, alert  and oriented  Airway & Oxygen Therapy: Patient Spontanous Breathing and Patient connected to nasal cannula oxygen  Post-op Assessment: Report given to RN, Post -op Vital signs reviewed and stable and Patient moving all extremities X 4  Post vital signs: Reviewed and stable  Last Vitals:  Vitals Value Taken Time  BP 141/73 06/04/19 1200  Temp    Pulse 74 06/04/19 1205  Resp 14 06/04/19 1205  SpO2 100 % 06/04/19 1205  Vitals shown include unvalidated device data.  Last Pain:  Vitals:   06/04/19 0746  TempSrc: Oral  PainSc: 3          Complications: No apparent anesthesia complications

## 2019-06-04 NOTE — Op Note (Signed)
[facility] Operative Note  06/04/19  Preoperative Diagnosis:  Ascending colon cancer    Postoperative Diagnosis: Same   Procedure(s) Performed:  Laparoscopic right hemicolectomy (extracorporeal side to side anastomosis)    Surgeon: Lanell Matar. Constance Haw, MD   Assistants: Aviva Signs, MD No qualified resident was available    Anesthesia: General endotracheal   Anesthesiologist: Patricia Killings, MD    Specimens:  Right colon    Estimated Blood Loss: Minimal   Blood Replacement: None    Complications: None    Wound Class: Clean contaminated    Operative Indications:  Patricia Hurst is a very sweet 74 yo who has a history of a newly diagnosed colon cancer found on colonoscopy. She is otherwise healthy and has not been having any symptoms from this cancer which is strictured on colonoscopy.  We discussed the plan for laparoscopic partial colectomy possible open and the risk of bleeding, infection, injury to other organs like the ureter, anastomotic leak, and needing further surgeries. We also discussed that the final pathology would determine her additional treatments. She completed a bowel preparation prior to surgery an a preoperative ECHO was performed due to a self reported history of mitral regurgitation. Her ECHO showed a normal EF and no mitral regurgitation.   Findings: Redundant colon with adhesion at the hepatic flexure and gallbladder fossa from the prior cholecystectomy, adhesions of the small bowel into the pelvis from hysterectomy and appendectomy    Procedure: The patient was taken to the operating room and placed supine. General endotracheal anesthesia was induced. Intravenous antibiotics were administered per protocol.  An orogastric tube positioned to decompress the stomach. The abdomen was prepared and draped in the usual sterile fashion.   An incision was made in the supraumbilical region vertical, and a towel clip was used to elevate the abdominal  wall. The Veress needle was inserted and the saline drop test was performed. Low insufflation pressures and saline test confirmed intraabdominal location. The abdomen was insufflated without issue. A 11 mm trocar was used to enter the abdomen under direct visualization with the 0 degree scope. No injury was noted from the Veress or the trocar. The abdomen was explored and there was no signs of any metastatic disease. There were significant adhesion at the hepatic flexure and between the redundant colon and the gallbladder fossa from her cholecystectomy. She also had small bowel adhesions in the right pelvis from her prior hysterectomy and appendectomy.  Additional trocars were placed with a 5 mm trocar in the suprapubic region and a 11 mm trocar in the left lower quadrant.  My assistant had a 62m trocar in the right mid abdomen from help with retraction.  These were all placed under direct visualization. The dissection was started lateral to the cecum after the patient was placed in Trendelenburg with the right side up. The redundant transverse colon and small bowel were allowed to fall to the left.  Using a Ligasure and cautery dissection with scissors, the white line of Toldt was taken down lateral to the cecum and extended up to the hepatic flexure. There were significant adhesion to the gallbladder fossa. With retract down from my assistant , I was able to open the gastrocolic ligament and dissect the colon off the gallbladder fossa with electrocautery and sharp dissection with the scissors and the Ligasure. Care was taken to ensure the stomach and the duodenum were protected during this time. The hepatic flexure dissection was met laterally, and the entire colon and mesentery  was able to be mobilized inferior off the retroperitoneal bed using blunt dissection.  Once this was performed, the patient was then placed in reverse Trendelenburg and the adhesion between the pelvis and the small bowel were taken down.   This was done with great care using sharp dissection with the scissors to ensure no injury to the ileum.  There was scar tissue along the pelvic brim where the appendectomy had been performed. The retroperitoneum was entered slightly, and due to fears of causing a ureter injury since we had not been able to identify it yet given the amount of scarring and the overall redundancy of the colon, we repositioned in the Trendelenburg position, and a completed the dissection off the retroperitoneum from superior to inferior connecting the dissection and keeping the retroperitoneal fat pad down.   The ureter was going to be medial to the area of the retroperitoneum that was violated, and we were having difficulty getting good exposure due to the redundancy of the colon.  The terminal ileum was elevated to see if the ureter could be identified, but redundant mobilized bowel prevented adequate exposure.    Since the colon was fully mobilized superior and the retroperitoneal plane intact, I opted to identify the ureter once open for the extracorporeal anastomosis.  The midline incision was extended around the umbilicus about 6cm total.  This was carried down through to the fascia with electrocautery. The peritoneal cavity was entered with care using the trocar to prevent injury and the pneumoperitoneum was released. The remaining trocars were removed.  A wound protector was placed.  The bowel was completely eviscerated and attempts were made to identify the right ureter prior to transection but this proved difficult, and I felt that it was in the undisturbed retroperitoneal plane. The transection points proximal on the terminal ileum and distal point on the transverse colon were identified, leaving the middle colic vessels as the colon was very redundant. The mass was felt in the mid point of the ascending colon and over 5 cm was on the distal margin.  A linear cutting stapler was used to transect the bowel.  The mesentery  was scored and could be seen clearly on the anterior and posterior sides give the amount of mobilization.  A Ligasure was used to take the mesentery and the right colic and ileocolic artery as proximal as possible to ensure an adequate resection and lymph node harvest.    Once the specimen was removed, the remaining bowel was tucked away with laparotomy pads. The right ureter was easily identified at the pelvic brim and followed superior and was free from injury and in the  Un-violated retroperitoneal plane.   The laparotomy pads were removed.  The two ends were brought up in anatomic alignment, and towels were used to drape off the area. Two Alis help to align the bowel, and enterotomies were made. A linear cutting stapler was used to form the common channel of the side to side anastomosis.  A TA 60 stapler was used to closed the common colotomy. The lumen was patent and wide. The staple line was oversewn with Lembert 3-0 Silk sutures and the mesentery was closed with 3-0 Vicryl running suture.  3-0 Silk suture was placed in the crotch to protect the staple line. Omentum was used to cover the anastomosis.  The bowel was placed back into the abdomen untwisted and irrigation was performed. Final inspection revealed acceptable hemostasis.   All members of the team changed gown and  gloves and a closing tray was used.  The midline was closed with 0 PDS suture in the standard fashion. The skin and the trocar sites were closed with staples.  Dressing were placed. All counts were correct at the end of the case. The patient was awakened from anesthesia and extubated without complication.  The patient went to the PACU in stable condition.  Dr. Arnoldo Morale was assisting throughout the procedure and was present for the critical portions of the case.     Patricia Labrum, MD [facility] [address] [place], [place] [address] [phone] (office)

## 2019-06-04 NOTE — Anesthesia Preprocedure Evaluation (Addendum)
Anesthesia Evaluation  Patient identified by MRN, date of birth, ID band Patient awake    Reviewed: Allergy & Precautions, NPO status , Patient's Chart, lab work & pertinent test results  History of Anesthesia Complications (+) PONV and history of anesthetic complications  Airway Mallampati: II  TM Distance: >3 FB Neck ROM: Full    Dental  (+) Partial Lower, Dental Advisory Given   Pulmonary neg pulmonary ROS,    Pulmonary exam normal breath sounds clear to auscultation       Cardiovascular Exercise Tolerance: Good  Rhythm:Regular Rate:Normal  1. Left ventricular ejection fraction, by visual estimation, is 60 to  65%. The left ventricle has normal function. There is no left ventricular  hypertrophy.  2. Left ventricular diastolic parameters are consistent with Grade I  diastolic dysfunction (impaired relaxation).  3. The left ventricle has no regional wall motion abnormalities.  4. Global right ventricle has normal systolic function.The right  ventricular size is normal. No increase in right ventricular wall  thickness.  5. Left atrial size was normal.  6. Right atrial size was normal.  7. The mitral valve is grossly normal. No evidence of mitral valve  regurgitation.  8. The tricuspid valve is grossly normal.  9. The tricuspid valve is grossly normal. Tricuspid valve regurgitation  is not demonstrated.  10. The aortic valve is tricuspid. Aortic valve regurgitation is not  visualized. No evidence of aortic valve sclerosis or stenosis.  11. The pulmonic valve was not well visualized. Pulmonic valve  regurgitation is not visualized.  12. The inferior vena cava is normal in size with greater than 50%  respiratory variability, suggesting right atrial pressure of 3 mmHg.  31-May-2019 09:24:00 [facility]300 ROUTINE RECORD Normal sinus rhythm Normal ECG No previous tracing Confirmed by Shelva Majestic  [phone]) on 06/02/2019 4:45:32 PM    Neuro/Psych Anxiety negative neurological ROS     GI/Hepatic negative GI ROS, Neg liver ROS,   Endo/Other  Hypothyroidism   Renal/GU negative Renal ROS  negative genitourinary   Musculoskeletal   Abdominal   Peds  Hematology   Anesthesia Other Findings   Reproductive/Obstetrics                             Anesthesia Physical Anesthesia Plan  ASA: II  Anesthesia Plan: General   Post-op Pain Management:    Induction:   PONV Risk Score and Plan:   Airway Management Planned: Oral ETT  Additional Equipment:   Intra-op Plan:   Post-operative Plan: Extubation in OR  Informed Consent: I have reviewed the patients History and Physical, chart, labs and discussed the procedure including the risks, benefits and alternatives for the proposed anesthesia with the patient or authorized representative who has indicated his/her understanding and acceptance.     Dental advisory given  Plan Discussed with: CRNA and Surgeon  Anesthesia Plan Comments:         Anesthesia Quick Evaluation

## 2019-06-05 LAB — BASIC METABOLIC PANEL
Anion gap: 7 (ref 5–15)
BUN: 23 mg/dL (ref 8–23)
CO2: 21 mmol/L — ABNORMAL LOW (ref 22–32)
Calcium: 8.1 mg/dL — ABNORMAL LOW (ref 8.9–10.3)
Chloride: 112 mmol/L — ABNORMAL HIGH (ref 98–111)
Creatinine, Ser: 1.37 mg/dL — ABNORMAL HIGH (ref 0.44–1.00)
GFR calc Af Amer: 44 mL/min — ABNORMAL LOW (ref >60)
GFR calc non Af Amer: 38 mL/min — ABNORMAL LOW (ref >60)
Glucose, Bld: 99 mg/dL (ref 70–99)
Potassium: 4.3 mmol/L (ref 3.5–5.1)
Sodium: 140 mmol/L (ref 135–145)

## 2019-06-05 LAB — CBC WITH DIFFERENTIAL/PLATELET
Abs Immature Granulocytes: 0.06 10*3/uL (ref 0.00–0.07)
Basophils Absolute: 0 10*3/uL (ref 0.0–0.1)
Basophils Relative: 0 %
Eosinophils Absolute: 0 10*3/uL (ref 0.0–0.5)
Eosinophils Relative: 0 %
HCT: 30.9 % — ABNORMAL LOW (ref 36.0–46.0)
Hemoglobin: 10.4 g/dL — ABNORMAL LOW (ref 12.0–15.0)
Immature Granulocytes: 1 %
Lymphocytes Relative: 12 %
Lymphs Abs: 1.3 10*3/uL (ref 0.7–4.0)
MCH: 32 pg (ref 26.0–34.0)
MCHC: 33.7 g/dL (ref 30.0–36.0)
MCV: 95.1 fL (ref 80.0–100.0)
Monocytes Absolute: 0.8 10*3/uL (ref 0.1–1.0)
Monocytes Relative: 7 %
Neutro Abs: 8.5 10*3/uL — ABNORMAL HIGH (ref 1.7–7.7)
Neutrophils Relative %: 80 %
Platelets: 161 10*3/uL (ref 150–400)
RBC: 3.25 MIL/uL — ABNORMAL LOW (ref 3.87–5.11)
RDW: 11.9 % (ref 11.5–15.5)
WBC: 10.6 10*3/uL — ABNORMAL HIGH (ref 4.0–10.5)
nRBC: 0 % (ref 0.0–0.2)

## 2019-06-05 LAB — PHOSPHORUS: Phosphorus: 4.2 mg/dL (ref 2.5–4.6)

## 2019-06-05 LAB — MAGNESIUM: Magnesium: 1.6 mg/dL — ABNORMAL LOW (ref 1.7–2.4)

## 2019-06-05 MED ORDER — HYDROMORPHONE HCL 1 MG/ML IJ SOLN: 0.5000 mg | INTRAMUSCULAR | Status: DC | PRN

## 2019-06-05 MED ORDER — HEPARIN SODIUM (PORCINE) 5000 UNIT/ML IJ SOLN
5000.0000 [IU] | Freq: Three times a day (TID) | INTRAMUSCULAR | Status: DC
  Administered 2019-06-05 – 2019-06-07 (×6): 5000 [IU] via SUBCUTANEOUS
  Filled 2019-06-05 (×7): qty 1

## 2019-06-05 MED ORDER — LACTATED RINGERS IV BOLUS
500.0000 mL | Freq: Once | INTRAVENOUS | Status: AC
  Administered 2019-06-05: 500 mL via INTRAVENOUS

## 2019-06-05 NOTE — Progress Notes (Signed)
1 Day Post-Op  Subjective: MIKENA EUGENE is a 74 y.o. 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 She is feeling well this morning and reports having a loose bowel movement overnight. She denies fevers, chills, or shortness of breath. She denies chest pain or palpitations. She initially had some nausea but this has since resolved.    Objective: Vital signs in last 24 hours: Temp:  [97.6 F (36.4 C)-98.8 F (37.1 C)] 98.8 F (37.1 C) (02/09 0456) Pulse Rate:  [68-86] 85 (02/09 0456) Resp:  [12-18] 18 (02/09 0456) BP: (109-154)/(57-74) 109/57 (02/09 0456) SpO2:  [95 %-100 %] 97 % (02/09 0456) Weight:  [63.5 kg] 63.5 kg (02/08 1351) Last BM Date: 06/04/19  Intake/Output from previous day: 02/08 0701 - 02/09 0700 In: 3356.9 [P.O.:600; I.V.:2556.9; IV Piggyback:200] Out: 275 [Urine:225; Blood:50] Intake/Output this shift: No intake/output data recorded.  General appearance: alert and cooperative Resp: clear to auscultation bilaterally Cardio: regular rate and rhythm, S1, S2 normal, no murmur, click, rub or gallop GI: soft, non-tender; bowel sounds normal; no masses,  no organomegaly  Lab Results:  Recent Labs    06/05/19 0510  WBC 10.6*  HGB 10.4*  HCT 30.9*  PLT 161   BMET Recent Labs    06/05/19 0510  NA 140  K 4.3  CL 112*  CO2 21*  GLUCOSE 99  BUN 23  CREATININE 1.37*  CALCIUM 8.1*   PT/INR No results for input(s): LABPROT, INR in the last 72 hours.  Studies/Results: No results found.  Anti-infectives: Anti-infectives (From admission, onward)   Start     Dose/Rate Route Frequency Ordered Stop   06/04/19 2200  cefoTEtan (CEFOTAN) 2 g in sodium chloride 0.9 % 100 mL IVPB     2 g 200 mL/hr over 30 Minutes Intravenous Every 12 hours 06/04/19 1347 06/07/19 0959   06/04/19 0815  cefoTEtan (CEFOTAN) 2 g in sodium chloride 0.9 % 100 mL IVPB     2 g 200 mL/hr over 30 Minutes Intravenous On call to O.R. 06/04/19  0806 06/05/19 0800      Assessment/Plan: s/p Procedure(s): LAPAROSCOPIC RIGHT HEMICOLECTOMY EDNA ROBLEDO is a 74 y.o. 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 She is overall doing well on enhanced recovery after surgery protocol. She does have an elevated creatinine most likely signifying pre-renal injury due to dehydration, although with BUN/Cr = 16.7 could consider intrinsic renal injury. She was given a bolus of fluids and foley is left in to monitor urine output.  Neuro: Scheduled Tylenol Q6, holding ibuprofen due to AKI, hydromorphone, Toradol, and oxycodone PRN CV: history of minor mitral regurgitation but without issues, d/c telemetry  Pulm: Encourage incentrive spirometry, lungs are clear Renal: Cr bump likely AKI secondary to dehydration, fluids, PO liquids, monitor urine output GI: Advance diet as tolerated, she has had bowel movement Endo: home levothyroxine Heme: heparin subcutaneous injection 5,000units   LOS: 1 day    [name] 06/05/2019

## 2019-06-05 NOTE — Anesthesia Postprocedure Evaluation (Signed)
Anesthesia Post Note  Patient: Patricia Hurst  Procedure(s) Performed: LAPAROSCOPIC RIGHT HEMICOLECTOMY (Right )  Patient location during evaluation: PACU Anesthesia Type: General Level of consciousness: awake and alert and oriented Pain management: pain level controlled Vital Signs Assessment: post-procedure vital signs reviewed and stable Respiratory status: spontaneous breathing Cardiovascular status: stable Postop Assessment: no apparent nausea or vomiting Anesthetic complications: no Comments: Late entry.     Last Vitals:  Vitals:   06/04/19 2158 06/05/19 0456  BP: 116/64 (!) 109/57  Pulse: 86 85  Resp: 18 18  Temp: 37.1 C 37.1 C  SpO2: 100% 97%    Last Pain:  Vitals:   06/05/19 0534  TempSrc:   PainSc: 7                  Sameen Leas

## 2019-06-06 LAB — CBC WITH DIFFERENTIAL/PLATELET
Abs Immature Granulocytes: 0.02 10*3/uL (ref 0.00–0.07)
Basophils Absolute: 0 10*3/uL (ref 0.0–0.1)
Basophils Relative: 1 %
Eosinophils Absolute: 0.2 10*3/uL (ref 0.0–0.5)
Eosinophils Relative: 3 %
HCT: 26.3 % — ABNORMAL LOW (ref 36.0–46.0)
Hemoglobin: 8.9 g/dL — ABNORMAL LOW (ref 12.0–15.0)
Immature Granulocytes: 0 %
Lymphocytes Relative: 19 %
Lymphs Abs: 1.2 10*3/uL (ref 0.7–4.0)
MCH: 32.1 pg (ref 26.0–34.0)
MCHC: 33.8 g/dL (ref 30.0–36.0)
MCV: 94.9 fL (ref 80.0–100.0)
Monocytes Absolute: 0.5 10*3/uL (ref 0.1–1.0)
Monocytes Relative: 9 %
Neutro Abs: 4.3 10*3/uL (ref 1.7–7.7)
Neutrophils Relative %: 68 %
Platelets: 123 10*3/uL — ABNORMAL LOW (ref 150–400)
RBC: 2.77 MIL/uL — ABNORMAL LOW (ref 3.87–5.11)
RDW: 12.1 % (ref 11.5–15.5)
WBC: 6.3 10*3/uL (ref 4.0–10.5)
nRBC: 0 % (ref 0.0–0.2)

## 2019-06-06 LAB — BASIC METABOLIC PANEL
Anion gap: 6 (ref 5–15)
BUN: 20 mg/dL (ref 8–23)
CO2: 26 mmol/L (ref 22–32)
Calcium: 8 mg/dL — ABNORMAL LOW (ref 8.9–10.3)
Chloride: 109 mmol/L (ref 98–111)
Creatinine, Ser: 1.32 mg/dL — ABNORMAL HIGH (ref 0.44–1.00)
GFR calc Af Amer: 46 mL/min — ABNORMAL LOW (ref >60)
GFR calc non Af Amer: 40 mL/min — ABNORMAL LOW (ref >60)
Glucose, Bld: 99 mg/dL (ref 70–99)
Potassium: 4.3 mmol/L (ref 3.5–5.1)
Sodium: 141 mmol/L (ref 135–145)

## 2019-06-06 LAB — PHOSPHORUS: Phosphorus: 1.6 mg/dL — ABNORMAL LOW (ref 2.5–4.6)

## 2019-06-06 LAB — SURGICAL PATHOLOGY

## 2019-06-06 LAB — MAGNESIUM: Magnesium: 1.6 mg/dL — ABNORMAL LOW (ref 1.7–2.4)

## 2019-06-06 MED ORDER — PROMETHAZINE HCL 25 MG/ML IJ SOLN: 12.5000 mg | Freq: Four times a day (QID) | INTRAMUSCULAR | Status: DC | PRN

## 2019-06-06 MED ORDER — POTASSIUM & SODIUM PHOSPHATES 280-160-250 MG PO PACK
1.0000 | PACK | Freq: Three times a day (TID) | ORAL | Status: AC
  Administered 2019-06-06 (×3): 1 via ORAL
  Filled 2019-06-06 (×3): qty 1

## 2019-06-06 MED ORDER — MAGNESIUM SULFATE 4 GM/100ML IV SOLN
4.0000 g | Freq: Once | INTRAVENOUS | Status: AC
  Administered 2019-06-06: 4 g via INTRAVENOUS
  Filled 2019-06-06: qty 100

## 2019-06-06 NOTE — Progress Notes (Signed)
2 Days Post-Op  Subjective: Patricia Hurst is a 74 y.o. 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 She has had multiple loose bowel movements yesterday evening and continuing to this morning. No blood in stool. She is having some nausea but was able to tolerate a soft diet for dinner with some potatoes. She denies fevers, chills, or shortness of breath. She denies chest pain or palpitations.   Objective: Vital signs in last 24 hours: Temp:  [98.3 F (36.8 C)-98.9 F (37.2 C)] 98.9 F (37.2 C) (02/10 0606) Pulse Rate:  [75-87] 87 (02/10 0606) Resp:  [20] 20 (02/09 2140) BP: (108-125)/(47-63) 125/63 (02/10 0606) SpO2:  [96 %] 96 % (02/10 0606) Last BM Date: 06/05/19  Intake/Output from previous day: 02/09 0701 - 02/10 0700 In: 240 [P.O.:240] Out: 1050 [Urine:1050] Intake/Output this shift: No intake/output data recorded.  General appearance: alert and cooperative Resp: clear to auscultation bilaterally Cardio: regular rate and rhythm, S1, S2 normal, no murmur, click, rub or gallop GI: soft, non-tender; bowel sounds normal; no masses,  no organomegaly   Lab Results:  Recent Labs    06/05/19 0510 06/06/19 0531  WBC 10.6* 6.3  HGB 10.4* 8.9*  HCT 30.9* 26.3*  PLT 161 123*   BMET Recent Labs    06/05/19 0510 06/06/19 0531  NA 140 141  K 4.3 4.3  CL 112* 109  CO2 21* 26  GLUCOSE 99 99  BUN 23 20  CREATININE 1.37* 1.32*  CALCIUM 8.1* 8.0*   PT/INR No results for input(s): LABPROT, INR in the last 72 hours.  Studies/Results: No results found.  Anti-infectives: Anti-infectives (From admission, onward)   Start     Dose/Rate Route Frequency Ordered Stop   06/04/19 2200  cefoTEtan (CEFOTAN) 2 g in sodium chloride 0.9 % 100 mL IVPB     2 g 200 mL/hr over 30 Minutes Intravenous Every 12 hours 06/04/19 1347 06/07/19 0959   06/04/19 0815  cefoTEtan (CEFOTAN) 2 g in sodium chloride 0.9 % 100 mL IVPB     2 g 200 mL/hr over  30 Minutes Intravenous On call to O.R. 06/04/19 0806 06/05/19 0800      Assessment/Plan: s/p Procedure(s): LAPAROSCOPIC RIGHT HEMICOLECTOMY ANJEL Hurst is a 74 y.o. 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 She is overall doing well on enhanced recovery after surgery protocol. On POD 1 she had an elevated creatinine most likely signifying pre-renal injury due to dehydration, although with BUN/Cr = 16.7 could consider intrinsic renal injury. She was given a bolus of fluids and her Cr is now trending down. Her urine output is good at 0.7-1.4 cc/kg/hr. Her diarrhea is most likely a consequence of colon resection and should normalize with time, no current concern for infectious etiology of diarrhea.  Neuro: Scheduled Tylenol Q6, holding ibuprofen due to AKI, hydromorphone, Toradol, and oxycodone PRN CV: history of minor mitral regurgitation but without issues Pulm: Encourage incentrive spirometry, lungs are clear Renal: Cr bump likely AKI secondary to dehydration, fluids, PO liquids, monitor urine output FENGI: Advance diet as tolerated, holding colace, does not need loperamide her bowels should correct on their own, replete magnesium, replete phosphorous Endo: home levothyroxine Heme: heparin subcutaneous injection 5,000units   LOS: 2 days    Patricia Hurst 06/06/2019

## 2019-06-06 NOTE — Care Management Important Message (Signed)
Important Message  Patient Details  Name: Patricia Hurst MRN: [id number] Date of Birth: 1946-04-22   Medicare Important Message Given:  Yes     Tommy Medal 06/06/2019, 3:32 PM

## 2019-06-07 DIAGNOSIS — D62 Acute posthemorrhagic anemia: Secondary | ICD-10-CM

## 2019-06-07 LAB — HEMOGLOBIN AND HEMATOCRIT, BLOOD
HCT: 24.1 % — ABNORMAL LOW (ref 36.0–46.0)
Hemoglobin: 8.1 g/dL — ABNORMAL LOW (ref 12.0–15.0)

## 2019-06-07 LAB — CBC WITH DIFFERENTIAL/PLATELET
Abs Immature Granulocytes: 0.01 10*3/uL (ref 0.00–0.07)
Basophils Absolute: 0 10*3/uL (ref 0.0–0.1)
Basophils Relative: 1 %
Eosinophils Absolute: 0.3 10*3/uL (ref 0.0–0.5)
Eosinophils Relative: 5 %
HCT: 22.4 % — ABNORMAL LOW (ref 36.0–46.0)
Hemoglobin: 7.6 g/dL — ABNORMAL LOW (ref 12.0–15.0)
Immature Granulocytes: 0 %
Lymphocytes Relative: 23 %
Lymphs Abs: 1.2 10*3/uL (ref 0.7–4.0)
MCH: 32.1 pg (ref 26.0–34.0)
MCHC: 33.9 g/dL (ref 30.0–36.0)
MCV: 94.5 fL (ref 80.0–100.0)
Monocytes Absolute: 0.4 10*3/uL (ref 0.1–1.0)
Monocytes Relative: 9 %
Neutro Abs: 3.1 10*3/uL (ref 1.7–7.7)
Neutrophils Relative %: 62 %
Platelets: 126 10*3/uL — ABNORMAL LOW (ref 150–400)
RBC: 2.37 MIL/uL — ABNORMAL LOW (ref 3.87–5.11)
RDW: 12.3 % (ref 11.5–15.5)
WBC: 4.9 10*3/uL (ref 4.0–10.5)
nRBC: 0 % (ref 0.0–0.2)

## 2019-06-07 LAB — BASIC METABOLIC PANEL
BUN: 15 mg/dL (ref 8–23)
CO2: 26 mmol/L (ref 22–32)
Calcium: 7.4 mg/dL — ABNORMAL LOW (ref 8.9–10.3)
Chloride: 112 mmol/L — ABNORMAL HIGH (ref 98–111)
Creatinine, Ser: 1.14 mg/dL — ABNORMAL HIGH (ref 0.44–1.00)
GFR calc Af Amer: 55 mL/min — ABNORMAL LOW (ref >60)
GFR calc non Af Amer: 48 mL/min — ABNORMAL LOW (ref >60)
Glucose, Bld: 96 mg/dL (ref 70–99)
Potassium: 3.7 mmol/L (ref 3.5–5.1)
Sodium: 140 mmol/L (ref 135–145)

## 2019-06-07 LAB — PROTIME-INR
INR: 1.1 (ref 0.8–1.2)
Prothrombin Time: 13.8 seconds (ref 11.4–15.2)

## 2019-06-07 LAB — PHOSPHORUS: Phosphorus: 2.3 mg/dL — ABNORMAL LOW (ref 2.5–4.6)

## 2019-06-07 LAB — MAGNESIUM: Magnesium: 2.3 mg/dL (ref 1.7–2.4)

## 2019-06-07 NOTE — Progress Notes (Signed)
3 Days Post-Op  Subjective: Patricia Hurst is a 74 y.o. 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 Her diarrhea has continued and is now bloody with dark red blood. She denies shortness of breath at rest or with exertion, she denies dizziness upon standing. She reports improved nausea and has been able to tolerate her soft diet well.    Objective: Vital signs in last 24 hours: Temp:  [98.1 F (36.7 C)-98.7 F (37.1 C)] 98.7 F (37.1 C) (02/11 0507) Pulse Rate:  [86-87] 87 (02/11 0507) Resp:  [18-20] 20 (02/11 0507) BP: (111-137)/(56-63) 111/56 (02/11 0507) SpO2:  [95 %-99 %] 95 % (02/11 0507) Last BM Date: 06/07/19  Intake/Output from previous day: 02/10 0701 - 02/11 0700 In: 2654 [P.O.:1040; I.V.:1614] Out: -  Intake/Output this shift: No intake/output data recorded.  General appearance: alert and cooperative Resp: clear to auscultation bilaterally Cardio: regular rate and rhythm, S1, S2 normal, no murmur, click, rub or gallop GI: soft, non-tender; bowel sounds normal; no masses,  no organomegaly   Lab Results:  Recent Labs    06/06/19 0531 06/07/19 0516  WBC 6.3 4.9  HGB 8.9* 7.6*  HCT 26.3* 22.4*  PLT 123* 126*   BMET Recent Labs    06/06/19 0531 06/07/19 0516  NA 141 140  K 4.3 3.7  CL 109 112*  CO2 26 26  GLUCOSE 99 96  BUN 20 15  CREATININE 1.32* 1.14*  CALCIUM 8.0* 7.4*   PT/INR No results for input(s): LABPROT, INR in the last 72 hours.  Studies/Results: No results found.  Anti-infectives: Anti-infectives (From admission, onward)   Start     Dose/Rate Route Frequency Ordered Stop   06/04/19 2200  cefoTEtan (CEFOTAN) 2 g in sodium chloride 0.9 % 100 mL IVPB     2 g 200 mL/hr over 30 Minutes Intravenous Every 12 hours 06/04/19 1347 06/06/19 2258   06/04/19 0815  cefoTEtan (CEFOTAN) 2 g in sodium chloride 0.9 % 100 mL IVPB     2 g 200 mL/hr over 30 Minutes Intravenous On call to O.R. 06/04/19 0806  06/05/19 0800      Assessment/Plan: s/p Procedure(s): LAPAROSCOPIC RIGHT HEMICOLECTOMY Patricia Hurst is a 74 y.o. 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 With multiple bloody stools she has post-operative acute blood loss anemia, however she remains asymptomatic with stable vital signs. Stopped heparin. We will check another hemoglobin and hematocrit at noon and re-assess blood loss.    Neuro: Scheduled Tylenol Q6, holding ibuprofen due to AKI, hydromorphone, Toradol, and oxycodone PRN CV: history of minor mitral regurgitation but without issues Pulm: Encourage incentrive spirometry, lungs are clear, no shortness of breath Renal: Improving with down trend in creatinine and good urine output, foley has been discontinued FENGI: Advance diet as tolerated, holding colace, does not need loperamide her bowels should correct on their own, Endo: home levothyroxine Heme: Stop heparin in setting of acute GI blood loss   LOS: 3 days    Patricia Hurst 06/07/2019

## 2019-06-08 LAB — BASIC METABOLIC PANEL
Anion gap: 6 (ref 5–15)
BUN: 10 mg/dL (ref 8–23)
CO2: 25 mmol/L (ref 22–32)
Calcium: 7.6 mg/dL — ABNORMAL LOW (ref 8.9–10.3)
Chloride: 110 mmol/L (ref 98–111)
Creatinine, Ser: 0.92 mg/dL (ref 0.44–1.00)
GFR calc Af Amer: >60 mL/min (ref >60)
GFR calc non Af Amer: >60 mL/min (ref >60)
Glucose, Bld: 98 mg/dL (ref 70–99)
Potassium: 3.9 mmol/L (ref 3.5–5.1)
Sodium: 141 mmol/L (ref 135–145)

## 2019-06-08 LAB — CBC WITH DIFFERENTIAL/PLATELET
Abs Immature Granulocytes: 0.04 10*3/uL (ref 0.00–0.07)
Basophils Absolute: 0 10*3/uL (ref 0.0–0.1)
Basophils Relative: 0 %
Eosinophils Absolute: 0.3 10*3/uL (ref 0.0–0.5)
Eosinophils Relative: 7 %
HCT: 21.4 % — ABNORMAL LOW (ref 36.0–46.0)
Hemoglobin: 7.3 g/dL — ABNORMAL LOW (ref 12.0–15.0)
Immature Granulocytes: 1 %
Lymphocytes Relative: 25 %
Lymphs Abs: 1.2 10*3/uL (ref 0.7–4.0)
MCH: 32.2 pg (ref 26.0–34.0)
MCHC: 34.1 g/dL (ref 30.0–36.0)
MCV: 94.3 fL (ref 80.0–100.0)
Monocytes Absolute: 0.4 10*3/uL (ref 0.1–1.0)
Monocytes Relative: 9 %
Neutro Abs: 2.8 10*3/uL (ref 1.7–7.7)
Neutrophils Relative %: 58 %
Platelets: 121 10*3/uL — ABNORMAL LOW (ref 150–400)
RBC: 2.27 MIL/uL — ABNORMAL LOW (ref 3.87–5.11)
RDW: 12.4 % (ref 11.5–15.5)
WBC: 4.7 10*3/uL (ref 4.0–10.5)
nRBC: 0 % (ref 0.0–0.2)

## 2019-06-08 LAB — MAGNESIUM: Magnesium: 2 mg/dL (ref 1.7–2.4)

## 2019-06-08 LAB — HEMOGLOBIN AND HEMATOCRIT, BLOOD
HCT: 22.2 % — ABNORMAL LOW (ref 36.0–46.0)
Hemoglobin: 7.5 g/dL — ABNORMAL LOW (ref 12.0–15.0)

## 2019-06-08 LAB — PHOSPHORUS: Phosphorus: 2.3 mg/dL — ABNORMAL LOW (ref 2.5–4.6)

## 2019-06-08 MED ORDER — DOCUSATE SODIUM 100 MG PO CAPS: 100.0000 mg | ORAL_CAPSULE | Freq: Two times a day (BID) | ORAL | 2 refills | Status: DC | PRN

## 2019-06-08 MED ORDER — OXYCODONE HCL 5 MG PO TABS: 5.0000 mg | ORAL_TABLET | ORAL | 0 refills | Status: DC | PRN

## 2019-06-08 MED ORDER — ONDANSETRON 4 MG PO TBDP: 4.0000 mg | ORAL_TABLET | Freq: Four times a day (QID) | ORAL | 0 refills | Status: DC | PRN

## 2019-06-08 NOTE — Care Management Important Message (Signed)
Important Message  Patient Details  Name: Patricia Hurst MRN: [id number] Date of Birth: 1945-05-31   Medicare Important Message Given:  Yes     Tommy Medal 06/08/2019, 2:03 PM

## 2019-06-08 NOTE — Discharge Summary (Addendum)
Physician Discharge Summary  Patient ID: Patricia Hurst MRN: [id number] DOB/AGE: [id number] y.o.  Admit date: 06/04/2019 Discharge date: 06/08/2019  Admission Diagnoses:  Colon cancer   Discharge Diagnoses:  Principal Problem:   Colon cancer ([place]) Active Problems:   Postoperative anemia due to acute blood loss   Discharged Condition: good  Hospital Course:  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 She was taken to surgery and underwent a laparoscopic right hemicolectomy, and did well post operatively. She started having bowel function early post operative and noted some bleeding. She has continued to have looser stools and has had some issues with control.  As her diet as been increased the stools have been getting more bulky, and she is able to get the restroom.  She has been tolerating her diet from a liquid to a soft diet.  She did have a drop her hemoglobin consistent with acute post operative anemia from blood loss both from the surgery and from the bleeding from her anastomosis as noted with the bloody BMs. This has resolved and her H&H has stabilized out. She did not require a transfusion, and was asymptomatic from the anemia.     Consults: None  Significant Diagnostic Studies:    Results for Patricia, Hurst (MRN [id number]) as of 06/08/2019 11:34  Ref. Range 06/07/2019 05:16 06/07/2019 12:09 06/08/2019 05:46 06/08/2019 09:39  Hemoglobin Latest Ref Range: 12.0 - 15.0 g/dL 7.6 (L) 8.1 (L) 7.3 (L) 7.5 (L)  HCT Latest Ref Range: 36.0 - 46.0 % 22.4 (L) 24.1 (L) 21.4 (L) 22.2 (L)   Treatments: Laparoscopic right hemicolectomy for colon cancer  Pathology: SURGICAL PATHOLOGY  CASE: [id number]  PATIENT: Patricia Hurst  Surgical Pathology Report   Clinical History: ascending colon cancer   FINAL MICROSCOPIC DIAGNOSIS:   A. 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); 2 tumor  deposits  - See oncology table   ONCOLOGY TABLE:   COLON AND RECTUM: Resection, Including Transanal Disk Excision of  Rectal Neoplasms   Procedure: Right hemicolectomy  Tumor Site: Mid ascending colon  Tumor Size: 2 cm  Macroscopic Tumor Perforation: Not identified  Histologic Type: Adenocarcinoma  Histologic Grade: G2: Moderately differentiated  Tumor Extension: Tumor invades pericolonic soft tissue  Margins: Uninvolved by tumor  Treatment Effect: No known presurgical therapy  Lymphovascular Invasion: Present  Perineural Invasion: Not identified  Tumor Deposits: Present - 2 tumor deposits  Regional Lymph Nodes:    Number of Lymph Nodes Involved: 3    Number of Lymph Nodes Examined: 15  Pathologic Stage Classification (pTNM, AJCC 8th Edition): pT3, pN1b  Ancillary Studies: MMR / MSI testing will be ordered.  Representative Tumor Block: A5  Comments: Appendix is absent   Discharge Exam: Blood pressure (!) 115/58, pulse 78, temperature 98.5 F (36.9 C), temperature source Oral, resp. rate 17, height '5\' 2"'$  (1.575 m), weight 63.5 kg, SpO2 95 %. General appearance: alert, cooperative and no distress Resp: normal work of breathing GI: soft, minimal distended, appropriately tender, staples c/d/i with no erythema or drainage, minor bruising midline extraction site Extremities: extremities normal, atraumatic, no cyanosis  or edema  Disposition: Discharge disposition: 01-Home or Self Care       Discharge Instructions    Call MD for:  difficulty breathing, headache or visual disturbances   Complete by: As directed    Call MD for:  persistant dizziness or  light-headedness   Complete by: As directed    Call MD for:  persistant nausea and vomiting   Complete by: As directed    Call MD for:  redness, tenderness, or signs of infection (pain, swelling, redness, odor or green/yellow discharge around incision site)   Complete by: As directed    Call MD for:  severe uncontrolled pain   Complete by: As directed    Call MD for:  temperature >100.4   Complete by: As directed    Increase activity slowly   Complete by: As directed      Allergies as of 06/08/2019      Reactions   Morphine Sulfate Rash   This per the patient's referral from her PCP.      Medication List    TAKE these medications   acetaminophen 500 MG tablet Commonly known as: TYLENOL Take 500 mg by mouth at bedtime and may repeat dose one time if needed.   ALKA-SELTZER PLUS COLD & COUGH PO Take 1 tablet by mouth daily as needed (cold symptoms.).   aspirin EC 81 MG tablet Take 1 tablet (81 mg total) by mouth daily.   CALCIUM 500 + D3 PO Take 1 tablet by mouth at bedtime.   docusate sodium 100 MG capsule Commonly known as: Colace Take 1 capsule (100 mg total) by mouth 2 (two) times daily as needed for mild constipation.   levothyroxine 25 MCG tablet Commonly known as: SYNTHROID Take 25 mcg by mouth daily before breakfast.   loratadine 10 MG tablet Commonly known as: CLARITIN Take 10 mg by mouth daily.   multivitamin with minerals Tabs tablet Take 1 tablet by mouth at bedtime.   ondansetron 4 MG disintegrating tablet Commonly known as: ZOFRAN-ODT Take 1 tablet (4 mg total) by mouth every 6 (six) hours as needed for nausea.   oxyCODONE 5 MG immediate release tablet Commonly known as: Oxy IR/ROXICODONE Take 1 tablet (5 mg total) by mouth every 4 (four) hours as needed for severe pain or breakthrough pain.   psyllium 58.6 % powder Commonly known as: METAMUCIL Take 1 packet by mouth daily. Mixed with OJ   timolol 0.5 % ophthalmic solution Commonly known as:  TIMOPTIC Place 1 drop into both eyes daily.       Future Appointments  Date Time Provider [place]  06/14/2019  2:15 PM Virl Cagey, MD [facility] None    Signed: Virl Cagey 06/08/2019, 11:51 AM

## 2019-06-08 NOTE — Discharge Instructions (Signed)
Discharge Open Abdominal Surgery Instructions:  Common Complaints: Pain at the incision site is common. This will improve with time. Take your pain medications as described below. Some nausea is common and poor appetite. The main goal is to stay hydrated the first few days after surgery.   Diet/ Activity: Diet as tolerated. You have started and tolerated a diet in the hospital, and should continue to increase what you are able to eat.   You may not have a large appetite, but it is important to stay hydrated. Drink 64 ounces of water a day. Your appetite will return with time.  Keep a dry dressing in place over your staples daily or as needed. Some minor pink/ blood tinged drainage is expected. This will stop in a few days after surgery.  Shower per your regular routine daily.  Do not take hot showers. Take warm showers that are less than 10 minutes. Path the incision dry. Wear an abdominal binder daily with activity. You do not have to wear this while sleeping or sitting.  Rest and listen to your body, but do not remain in bed all day.  Walk everyday for at least 15-20 minutes. Deep cough and move around every 1-2 hours in the first few days after surgery.  Do not lift > 10 lbs, perform excessive bending, pushing, pulling, squatting for 6-8 weeks after surgery.  The activity restrictions and the abdominal binder are to prevent hernia formation at your incision while you are healing.  Do not place lotions or balms on your incision unless instructed to specifically by Dr. Constance Haw.   Pain Expectations and Narcotics: -After surgery you will have pain associated with your incisions and this is normal. The pain is muscular and nerve pain, and will get better with time. -You are encouraged and expected to take non narcotic medications like tylenol and ibuprofen (when able) to treat pain as multiple modalities can aid with pain treatment. -Narcotics are only used when pain is severe or there is  breakthrough pain. -You are not expected to have a pain score of 0 after surgery, as we cannot prevent pain. A pain score of 3-4 that allows you to be functional, move, walk, and tolerate some activity is the goal. The pain will continue to improve over the days after surgery and is dependent on your surgery. -Due to [place] law, we are only able to give a certain amount of pain medication to treat post operative pain, and we only give additional narcotics on a patient by patient basis.  -For most laparoscopic surgery, studies have shown that the majority of patients only need 10-15 narcotic pills, and for open surgeries most patients only need 15-20.   -Having appropriate expectations of pain and knowledge of pain management with non narcotics is important as we do not want anyone to become addicted to narcotic pain medication.  -Using ice packs in the first 48 hours and heating pads after 48 hours, wearing an abdominal binder (when recommended), and using over the counter medications are all ways to help with pain management.   -Simple acts like meditation and mindfulness practices after surgery can also help with pain control and research has proven the benefit of these practices.  Medication: Take tylenol and ibuprofen as needed for pain control, alternating every 4-6 hours.  Example:  Tylenol 1000mg  @ 6am, 12noon, 6pm, 6midnight (Do not exceed 4000mg  of tylenol a day). Ibuprofen 800mg  @ 9am, 3pm, 9pm, 3am (Do not exceed 3600mg  of ibuprofen a day).  Take Roxicodone for breakthrough pain every 4 hours.  Take Colace for constipation related to narcotic pain medication. If you do not have a bowel movement in 2 days, take Miralax over the counter.  Drink plenty of water to also prevent constipation.   Contact Information: If you have questions or concerns, please call our office, [phone], Monday- Thursday 8AM-5PM and Friday 8AM-12Noon.  If it is after hours or on the weekend, please call [facility]  Main Number, [phone], and ask to speak to the surgeon on call for Dr. Constance Haw at [facility].    Laparoscopic Colectomy, Care After This sheet gives you information about how to care for yourself after your procedure. Your health care provider may also give you more specific instructions. If you have problems or questions, contact your health care provider. What can I expect after the procedure? After your procedure, it is common to have the following:  Pain in your abdomen, especially in the incision areas. You will be given medicine to control the pain.  Tiredness. This is a normal part of the recovery process. Your energy level will return to normal over the next several weeks.  Changes in your bowel movements, such as constipation or needing to go more often. Talk with your health care provider about how to manage this. Follow these instructions at home: Medicines  Take over-the-counter and prescription medicines only as told by your health care provider.  Do not drive or use heavy machinery while taking prescription pain medicine.  Do not drink alcohol while taking prescription pain medicine.  If you were prescribed an antibiotic medicine, use it as told by your health care provider. Do not stop using the antibiotic even if you start to feel better. Incision care   Follow instructions from your health care provider about how to take care of your incision areas. Make sure you: ? Keep your incisions clean and dry. ? Leave staples, stitches (sutures), skin glue, or adhesive strips in place. These skin closures may need to stay in place for 2 weeks or longer. If adhesive strip edges start to loosen and curl up, you may trim the loose edges. Do not remove adhesive strips completely unless your health care provider tells you to do that.  Do not wear tight clothing over the incisions. Tight clothing may rub and irritate the incision areas, which may cause the incisions to open.  Do  not take baths, swim, or use a hot tub until your health care provider approves.   You may shower.  Check your incision area every day for signs of infection. Check for: ? More redness, swelling, or pain. ? More fluid or blood. ? Warmth. ? Pus or a bad smell. Activity  Avoid lifting anything that is heavier than 10 lb (4.5 kg) for 2 weeks or until your health care provider says it is okay.  You may resume normal activities as told by your health care provider. Ask your health care provider what activities are safe for you.  Take rest breaks during the day as needed. Eating and drinking  Follow instructions from your health care provider about what you can eat after surgery.  To prevent or treat constipation while you are taking prescription pain medicine, your health care provider may recommend that you: ? Drink enough fluid to keep your urine clear or pale yellow. ? Take over-the-counter or prescription medicines. ? Initially start with a a bland diet and slowly increase the diet from there. Eat soft chicken, potatoes,  eggs, cooked vegetables, etc.  ? Then move to eat foods that are high in fiber, such as fresh fruits and vegetables, whole grains, and beans. ? Limit foods that are high in fat and processed sugars, such as fried and sweet foods. General instructions  Ask your health care provider when you will need an appointment to get your sutures or staples removed.  Keep all follow-up visits as told by your health care provider. This is important. Contact a health care provider if:  You have more redness, swelling, or pain around your incisions.  You have more fluid or blood coming from the incisions.  Your incisions feel warm to the touch.  You have pus or a bad smell coming from your incisions or your dressing.  You have a fever.  You have an incision that breaks open (edges not staying together) after sutures or staples have been removed. Get help right away  if:  You develop a rash.  You have chest pain or difficulty breathing.  You have pain or swelling in your legs.  You feel light-headed or you faint.  Your abdomen swells (becomes distended).  You have nausea or vomiting.  You have blood in your stool (feces). This information is not intended to replace advice given to you by your health care provider. Make sure you discuss any questions you have with your health care provider. Document Revised: 12/30/2017 Document Reviewed: 01/12/2016 Elsevier Patient Education  [place].

## 2019-06-11 ENCOUNTER — Other Ambulatory Visit: Payer: Self-pay

## 2019-06-11 NOTE — Patient Outreach (Signed)
[facility]) Care Management  06/11/2019  TAKYLAH POPOVICH January 18, 1946 [id number]   Referral received.  No outreach warranted at this time.  Transition of Care calls being completed via EMMI. RN CM will outreach patient for any red flags received.    Plan: RN CM will close case.    Jone Baseman, RN, MSN [place] [facility] Care Management Coordinator Direct Line [phone] Cell [phone] Toll Free: [phone]  Fax: [phone]

## 2019-06-14 ENCOUNTER — Ambulatory Visit: Payer: Self-pay | Admitting: General Surgery

## 2019-06-15 DIAGNOSIS — Z299 Encounter for prophylactic measures, unspecified: Secondary | ICD-10-CM | POA: Diagnosis not present

## 2019-06-15 DIAGNOSIS — C189 Malignant neoplasm of colon, unspecified: Secondary | ICD-10-CM | POA: Diagnosis not present

## 2019-06-15 DIAGNOSIS — Z6824 Body mass index (BMI) 24.0-24.9, adult: Secondary | ICD-10-CM | POA: Diagnosis not present

## 2019-06-15 DIAGNOSIS — E7800 Pure hypercholesterolemia, unspecified: Secondary | ICD-10-CM | POA: Diagnosis not present

## 2019-06-15 DIAGNOSIS — Z09 Encounter for follow-up examination after completed treatment for conditions other than malignant neoplasm: Secondary | ICD-10-CM | POA: Diagnosis not present

## 2019-06-15 DIAGNOSIS — R110 Nausea: Secondary | ICD-10-CM | POA: Diagnosis not present

## 2019-06-19 ENCOUNTER — Ambulatory Visit (INDEPENDENT_AMBULATORY_CARE_PROVIDER_SITE_OTHER): Payer: Self-pay | Admitting: General Surgery

## 2019-06-19 ENCOUNTER — Other Ambulatory Visit: Payer: Self-pay

## 2019-06-19 ENCOUNTER — Encounter: Payer: Self-pay | Admitting: General Surgery

## 2019-06-19 VITALS — BP 139/87 | HR 70 | Temp 98.0°F | Resp 14 | Ht 63.0 in | Wt 136.2 lb

## 2019-06-19 DIAGNOSIS — C182 Malignant neoplasm of ascending colon: Secondary | ICD-10-CM

## 2019-06-19 NOTE — Patient Instructions (Signed)
Increase activity slowly. No heavy lifting > 10 lbs, pushing, pulling, bending squatting until 6-8 weeks after surgery.   Implanted Port Insertion Implanted port insertion is a procedure to put in a port and catheter. The port is a device with an injectable disk that can be accessed by your health care provider. The port is connected to a vein in the chest or neck by a small flexible tube (catheter). There are different types of ports. The implanted port may be used as a long-term IV access for:  Medicines, such as chemotherapy.  Fluids.  Liquid nutrition, such as total parenteral nutrition (TPN). When you have a port, this means that your health care provider will not need to use the veins in your arms for these procedures. Tell a health care provider about:  Any allergies you have.  All medicines you are taking, especially blood thinners, as well as any vitamins, herbs, eye drops, creams, over-the-counter medicines, and steroids.  Any problems you or family members have had with anesthetic medicines.  Any blood disorders you have.  Any surgeries you have had.  Any medical conditions you have or have had, including diabetes or kidney problems.  Whether you are pregnant or may be pregnant. What are the risks? Generally, this is a safe procedure. However, problems may occur, including:  Allergic reactions to medicines or dyes.  Damage to other structures or organs.  Infection.  Damage to the blood vessel, bruising, or bleeding at the puncture site.  Blood clot.  Breakdown of the skin over the port.  A collection of air in the chest that can cause one of the lungs to collapse (pneumothorax). This is rare. What happens before the procedure? Medicines  Ask your health care provider about: ? Changing or stopping your regular medicines. This is especially important if you are taking diabetes medicines or blood thinners. ? Taking medicines such as aspirin and ibuprofen. These  medicines can thin your blood. Do not take these medicines unless your health care provider tells you to take them. ? Taking over-the-counter medicines, vitamins, herbs, and supplements. General instructions  Plan to have someone take you home from the hospital or clinic.  If you will be going home right after the procedure, plan to have someone with you for 24 hours.  You may have blood tests.  Do not use any products that contain nicotine or tobacco for at least 4-6 weeks before the procedure. These products include cigarettes, e-cigarettes, and chewing tobacco. If you need help quitting, ask your health care provider.  Ask your health care provider what steps will be taken to help prevent infection. These may include: ? Removing hair at the surgery site. ? Washing skin with a germ-killing soap. ? Taking antibiotic medicine. What happens during the procedure?   An IV will be inserted into one of your veins.  You will be given one or more of the following: ? A medicine to help you relax (sedative). ? A medicine to numb the area (local anesthetic).  Two small incisions will be made to insert the port. ? One smaller incision will be made in your neck to get access to the vein where the catheter will lie. ? The other incision will be made in the upper chest. This is where the port will lie.  The procedure may be done using continuous X-ray (fluoroscopy) or other imaging tools for guidance.  The port and catheter will be placed. There may be a small, raised area where the  port is.  The port will be flushed with a salt solution (saline), and blood will be drawn to make sure that it is working correctly.  The incisions will be closed.  Bandages (dressings) may be placed over the incisions. The procedure may vary among health care providers and hospitals. What happens after the procedure?  Your blood pressure, heart rate, breathing rate, and blood oxygen level will be monitored  until you leave the hospital or clinic.  Do not drive for 24 hours if you were given a sedative during your procedure.  You will be given a manufacturer's information card for the type of port that you have. Keep this with you.  Your port will need to be flushed and checked as told by your health care provider, usually every few weeks.  A chest X-ray will be done to: ? Check the placement of the port. ? Make sure there is no injury to your lung. Summary  Implanted port insertion is a procedure to put in a port and catheter.  The implanted port is used as a long-term IV access.  The port will need to be flushed and checked as told by your health care provider, usually every few weeks.  Keep your manufacturer's information card with you at all times. This information is not intended to replace advice given to you by your health care provider. Make sure you discuss any questions you have with your health care provider. Document Revised: 08/04/2018 Document Reviewed: 11/08/2017 Elsevier Patient Education  [place].
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
## 2019-06-26 ENCOUNTER — Encounter (HOSPITAL_COMMUNITY): Payer: Self-pay

## 2019-06-26 ENCOUNTER — Other Ambulatory Visit: Payer: Self-pay

## 2019-06-27 ENCOUNTER — Inpatient Hospital Stay (HOSPITAL_COMMUNITY): Payer: Medicare HMO

## 2019-06-27 ENCOUNTER — Inpatient Hospital Stay (HOSPITAL_COMMUNITY): Payer: Medicare HMO | Attending: Hematology | Admitting: Hematology

## 2019-06-27 ENCOUNTER — Encounter (HOSPITAL_COMMUNITY): Payer: Self-pay | Admitting: Hematology

## 2019-06-27 VITALS — BP 132/67 | HR 81 | Temp 97.7°F | Resp 17 | Ht 62.5 in | Wt 136.4 lb

## 2019-06-27 DIAGNOSIS — H409 Unspecified glaucoma: Secondary | ICD-10-CM | POA: Insufficient documentation

## 2019-06-27 DIAGNOSIS — R531 Weakness: Secondary | ICD-10-CM | POA: Diagnosis not present

## 2019-06-27 DIAGNOSIS — F419 Anxiety disorder, unspecified: Secondary | ICD-10-CM | POA: Diagnosis not present

## 2019-06-27 DIAGNOSIS — E039 Hypothyroidism, unspecified: Secondary | ICD-10-CM | POA: Insufficient documentation

## 2019-06-27 DIAGNOSIS — R197 Diarrhea, unspecified: Secondary | ICD-10-CM | POA: Insufficient documentation

## 2019-06-27 DIAGNOSIS — C183 Malignant neoplasm of hepatic flexure: Secondary | ICD-10-CM | POA: Diagnosis not present

## 2019-06-27 DIAGNOSIS — C182 Malignant neoplasm of ascending colon: Secondary | ICD-10-CM

## 2019-06-27 DIAGNOSIS — D649 Anemia, unspecified: Secondary | ICD-10-CM | POA: Diagnosis not present

## 2019-06-27 DIAGNOSIS — E7800 Pure hypercholesterolemia, unspecified: Secondary | ICD-10-CM | POA: Diagnosis not present

## 2019-06-27 DIAGNOSIS — R112 Nausea with vomiting, unspecified: Secondary | ICD-10-CM | POA: Diagnosis not present

## 2019-06-27 DIAGNOSIS — R5383 Other fatigue: Secondary | ICD-10-CM | POA: Diagnosis not present

## 2019-06-27 LAB — CBC WITH DIFFERENTIAL/PLATELET
Abs Immature Granulocytes: 0.01 10*3/uL (ref 0.00–0.07)
Basophils Absolute: 0 10*3/uL (ref 0.0–0.1)
Basophils Relative: 0 %
Eosinophils Absolute: 0.3 10*3/uL (ref 0.0–0.5)
Eosinophils Relative: 5 %
HCT: 29.7 % — ABNORMAL LOW (ref 36.0–46.0)
Hemoglobin: 10.3 g/dL — ABNORMAL LOW (ref 12.0–15.0)
Immature Granulocytes: 0 %
Lymphocytes Relative: 28 %
Lymphs Abs: 1.4 10*3/uL (ref 0.7–4.0)
MCH: 31.8 pg (ref 26.0–34.0)
MCHC: 34.7 g/dL (ref 30.0–36.0)
MCV: 91.7 fL (ref 80.0–100.0)
Monocytes Absolute: 0.4 10*3/uL (ref 0.1–1.0)
Monocytes Relative: 9 %
Neutro Abs: 3 10*3/uL (ref 1.7–7.7)
Neutrophils Relative %: 58 %
Platelets: 194 10*3/uL (ref 150–400)
RBC: 3.24 MIL/uL — ABNORMAL LOW (ref 3.87–5.11)
RDW: 12.6 % (ref 11.5–15.5)
WBC: 5.1 10*3/uL (ref 4.0–10.5)
nRBC: 0 % (ref 0.0–0.2)

## 2019-06-27 LAB — COMPREHENSIVE METABOLIC PANEL
ALT: 16 U/L (ref 0–44)
AST: 22 U/L (ref 15–41)
Albumin: 4 g/dL (ref 3.5–5.0)
Alkaline Phosphatase: 50 U/L (ref 38–126)
Anion gap: 8 (ref 5–15)
BUN: 11 mg/dL (ref 8–23)
CO2: 25 mmol/L (ref 22–32)
Calcium: 9 mg/dL (ref 8.9–10.3)
Chloride: 105 mmol/L (ref 98–111)
Creatinine, Ser: 0.93 mg/dL (ref 0.44–1.00)
GFR calc Af Amer: >60 mL/min (ref >60)
GFR calc non Af Amer: >60 mL/min (ref >60)
Glucose, Bld: 98 mg/dL (ref 70–99)
Potassium: 4.2 mmol/L (ref 3.5–5.1)
Sodium: 138 mmol/L (ref 135–145)
Total Bilirubin: 1.1 mg/dL (ref 0.3–1.2)
Total Protein: 7 g/dL (ref 6.5–8.1)

## 2019-06-27 LAB — IRON AND TIBC
Iron: 43 ug/dL (ref 28–170)
Saturation Ratios: 13 % (ref 10.4–31.8)
TIBC: 326 ug/dL (ref 250–450)
UIBC: 283 ug/dL

## 2019-06-27 LAB — FERRITIN: Ferritin: 132 ng/mL (ref 11–307)

## 2019-06-27 LAB — VITAMIN B12: Vitamin B-12: 439 pg/mL (ref 180–914)

## 2019-06-27 LAB — FOLATE: Folate: 46.2 ng/mL (ref >5.9)
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
## 2019-06-27 NOTE — Patient Instructions (Signed)
[place] at [facility] Discharge Instructions  You were seen today by Dr. Delton Coombes. He went over your history, family history and how you've been feeling lately. He went over your scans and results. These results show that you have a Stage 3 colon cancer. You will need chemotherapy to help prevent your colon cancer from coming back. You will need 3 months of Chemotherapy. You will have blood drawn today before you leave. He will schedule you for a CT scan as well to complete your work-up. He will get you scheduled for a port-a-cath placement. He will see you back after your scan for follow up.   Thank you for choosing [place] at [facility] to provide your oncology and hematology care.  To afford each patient quality time with our provider, please arrive at least 15 minutes before your scheduled appointment time.   If you have a lab appointment with the [place] please come in thru the  Main Entrance and check in at the main information desk  You need to re-schedule your appointment should you arrive 10 or more minutes late.  We strive to give you quality time with our providers, and arriving late affects you and other patients whose appointments are after yours.  Also, if you no show three or more times for appointments you may be dismissed from the clinic at the providers discretion.     Again, thank you for choosing [facility].  Our hope is that these requests will decrease the amount of time that you wait before being seen by our physicians.       _____________________________________________________________  Should you have questions after your visit to [facility], please contact our office at [phone] between the hours of 8:00 a.m. and 4:30 p.m.  Voicemails left after 4:00 p.m. will not be returned until the following business day.  For prescription refill requests, have your pharmacy contact our office  and allow 72 hours.    Cancer Center Support Programs:   > Cancer Support Group  2nd Tuesday of the month 1pm-2pm, Journey Room

## 2019-06-27 NOTE — Assessment & Plan Note (Signed)
1.  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 -CTAP on 05/24/2019 showed circumferential wall thickening in the ascending colon just proximal to the hepatic flexure.  1.2 x 0.6 x 0.7 cm fluid density lesion in the head of the pancreas could be a small postinflammatory cystic lesion or intraductal papillary mucinous neoplasm. -She underwent right hemicolectomy on 06/04/2019 which showed grade 2 adenocarcinoma, margins negative, LVI positive, 3/15 lymph nodes positive, 2 tumor deposits positive.  MMR preserved. -I have discussed the normal prognosis of stage III colon cancer which has high risk of recurrence.  In general adjuvant chemotherapy is recommended. -She is a low risk stage III.  Hence I have recommended 3 months of CapeOx regimen.  Oxaliplatin will be given as 130 mg/m2 on day 1 with capecitabine 850 mg/m2 on days 1 through 14 every 21 days.  We discussed about the side effects in detail. -She will have a port placed by Dr. Constance Haw. -I have recommended CT of the chest for completion of staging process.  We will also obtain a CEA level. -If she does have high co-pays with capecitabine, we will give her 6 months of FOLFOX.  2.  Family history: -Father and sister had brain tumors.  Another sister died of breast cancer in her 66s.  2 sisters had malignant melanoma.  Niece died of breast cancer. -Because of her extensive family history, I have recommended genetic testing.  3.  Normocytic anemia: -Her most recent hemoglobin was 7.5. -I have recommended further work-up including ferritin, iron panel, T07 and folic acid.

## 2019-06-28 LAB — CEA: CEA: 1.3 ng/mL (ref 0.0–4.7)

## 2019-06-29 ENCOUNTER — Other Ambulatory Visit: Payer: Self-pay

## 2019-06-29 ENCOUNTER — Encounter (HOSPITAL_COMMUNITY): Payer: Self-pay
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
## 2019-07-02 ENCOUNTER — Other Ambulatory Visit (HOSPITAL_COMMUNITY)
Admission: RE | Admit: 2019-07-02 | Discharge: 2019-07-02 | Disposition: A | Discharge status: Home / Self Care | Payer: Medicare HMO | Source: Ambulatory Visit | Attending: General Surgery | Admitting: General Surgery

## 2019-07-02 ENCOUNTER — Encounter (HOSPITAL_COMMUNITY)
Admission: RE | Admit: 2019-07-02 | Discharge: 2019-07-02 | Disposition: A | Discharge status: Home / Self Care | Payer: Medicare HMO | Source: Ambulatory Visit | Attending: General Surgery | Admitting: General Surgery

## 2019-07-02 ENCOUNTER — Other Ambulatory Visit: Payer: Self-pay

## 2019-07-02 DIAGNOSIS — Z01812 Encounter for preprocedural laboratory examination: Secondary | ICD-10-CM | POA: Diagnosis not present

## 2019-07-02 DIAGNOSIS — Z20822 Contact with and (suspected) exposure to covid-19: Secondary | ICD-10-CM | POA: Insufficient documentation

## 2019-07-03 LAB — SARS CORONAVIRUS 2 (TAT 6-24 HRS): SARS Coronavirus 2: NEGATIVE

## 2019-07-04 ENCOUNTER — Ambulatory Visit (HOSPITAL_COMMUNITY): Payer: Medicare HMO | Admitting: Anesthesiology

## 2019-07-04 ENCOUNTER — Other Ambulatory Visit: Payer: Self-pay

## 2019-07-04 ENCOUNTER — Ambulatory Visit (HOSPITAL_COMMUNITY): Payer: Medicare HMO

## 2019-07-04 ENCOUNTER — Encounter (HOSPITAL_COMMUNITY): Payer: Self-pay | Admitting: General Surgery

## 2019-07-04 ENCOUNTER — Ambulatory Visit (HOSPITAL_COMMUNITY)
Admission: RE | Admit: 2019-07-04 | Discharge: 2019-07-04 | Disposition: A | Discharge status: Home / Self Care | Payer: Medicare HMO | Attending: General Surgery | Admitting: General Surgery

## 2019-07-04 ENCOUNTER — Encounter (HOSPITAL_COMMUNITY)
Admission: RE | Disposition: A | Discharge status: Home / Self Care | Payer: Self-pay | Source: Home / Self Care | Attending: General Surgery

## 2019-07-04 DIAGNOSIS — Z90710 Acquired absence of both cervix and uterus: Secondary | ICD-10-CM | POA: Insufficient documentation

## 2019-07-04 DIAGNOSIS — Z7982 Long term (current) use of aspirin: Secondary | ICD-10-CM | POA: Insufficient documentation

## 2019-07-04 DIAGNOSIS — Z9049 Acquired absence of other specified parts of digestive tract: Secondary | ICD-10-CM | POA: Diagnosis not present

## 2019-07-04 DIAGNOSIS — I340 Nonrheumatic mitral (valve) insufficiency: Secondary | ICD-10-CM | POA: Insufficient documentation

## 2019-07-04 DIAGNOSIS — Z79899 Other long term (current) drug therapy: Secondary | ICD-10-CM | POA: Insufficient documentation

## 2019-07-04 DIAGNOSIS — Z8349 Family history of other endocrine, nutritional and metabolic diseases: Secondary | ICD-10-CM | POA: Diagnosis not present

## 2019-07-04 DIAGNOSIS — C182 Malignant neoplasm of ascending colon: Secondary | ICD-10-CM

## 2019-07-04 DIAGNOSIS — Z885 Allergy status to narcotic agent status: Secondary | ICD-10-CM | POA: Diagnosis not present

## 2019-07-04 DIAGNOSIS — E7800 Pure hypercholesterolemia, unspecified: Secondary | ICD-10-CM | POA: Insufficient documentation

## 2019-07-04 DIAGNOSIS — Z79890 Hormone replacement therapy (postmenopausal): Secondary | ICD-10-CM | POA: Insufficient documentation

## 2019-07-04 DIAGNOSIS — C189 Malignant neoplasm of colon, unspecified: Secondary | ICD-10-CM | POA: Diagnosis not present

## 2019-07-04 DIAGNOSIS — Z95828 Presence of other vascular implants and grafts: Secondary | ICD-10-CM

## 2019-07-04 DIAGNOSIS — Z8249 Family history of ischemic heart disease and other diseases of the circulatory system: Secondary | ICD-10-CM | POA: Diagnosis not present

## 2019-07-04 DIAGNOSIS — Z808 Family history of malignant neoplasm of other organs or systems: Secondary | ICD-10-CM | POA: Insufficient documentation

## 2019-07-04 DIAGNOSIS — E039 Hypothyroidism, unspecified: Secondary | ICD-10-CM | POA: Diagnosis not present

## 2019-07-04 DIAGNOSIS — Z803 Family history of malignant neoplasm of breast: Secondary | ICD-10-CM | POA: Insufficient documentation

## 2019-07-04 DIAGNOSIS — Z452 Encounter for adjustment and management of vascular access device: Secondary | ICD-10-CM | POA: Diagnosis not present

## 2019-07-04 HISTORY — DX: Unspecified glaucoma: H40.9

## 2019-07-04 HISTORY — PX: PORTACATH PLACEMENT: SHX2246

## 2019-07-04 SURGERY — INSERTION, TUNNELED CENTRAL VENOUS DEVICE, WITH PORT
Anesthesia: General | Site: Chest | Laterality: Left

## 2019-07-04 MED ORDER — CEFAZOLIN SODIUM-DEXTROSE 2-4 GM/100ML-% IV SOLN
2.0000 g | INTRAVENOUS | Status: AC
  Administered 2019-07-04: 2 g via INTRAVENOUS
  Filled 2019-07-04: qty 100

## 2019-07-04 MED ORDER — LACTATED RINGERS IV SOLN
INTRAVENOUS | Status: DC
  Administered 2019-07-04: 1000 mL via INTRAVENOUS

## 2019-07-04 MED ORDER — SODIUM CHLORIDE (PF) 0.9 % IJ SOLN
INTRAMUSCULAR | Status: DC | PRN
  Administered 2019-07-04: 10 mL via INTRAVENOUS

## 2019-07-04 MED ORDER — GLYCOPYRROLATE 0.2 MG/ML IJ SOLN
INTRAMUSCULAR | Status: DC | PRN
  Administered 2019-07-04: .1 mg via INTRAVENOUS

## 2019-07-04 MED ORDER — HEPARIN SOD (PORK) LOCK FLUSH 100 UNIT/ML IV SOLN
INTRAVENOUS | Status: DC | PRN
  Administered 2019-07-04: 500 [IU] via INTRAVENOUS

## 2019-07-04 MED ORDER — LIDOCAINE HCL (PF) 1 % IJ SOLN
INTRAMUSCULAR | Status: DC | PRN
  Administered 2019-07-04: 9 mL
  Administered 2019-07-04: 8 mL

## 2019-07-04 MED ORDER — KETAMINE HCL 50 MG/5ML IJ SOSY
PREFILLED_SYRINGE | INTRAMUSCULAR | Status: AC
  Filled 2019-07-04: qty 5

## 2019-07-04 MED ORDER — HEPARIN SOD (PORK) LOCK FLUSH 100 UNIT/ML IV SOLN
INTRAVENOUS | Status: AC
  Filled 2019-07-04: qty 5

## 2019-07-04 MED ORDER — CEPHALEXIN 500 MG PO CAPS: 500.0000 mg | ORAL_CAPSULE | Freq: Two times a day (BID) | ORAL | 0 refills | Status: AC

## 2019-07-04 MED ORDER — PROPOFOL 500 MG/50ML IV EMUL
INTRAVENOUS | Status: DC | PRN
  Administered 2019-07-04: 75 ug/kg/min via INTRAVENOUS

## 2019-07-04 MED ORDER — PROPOFOL 10 MG/ML IV BOLUS
INTRAVENOUS | Status: AC
  Filled 2019-07-04: qty 40

## 2019-07-04 MED ORDER — LIDOCAINE HCL (CARDIAC) PF 100 MG/5ML IV SOSY
PREFILLED_SYRINGE | INTRAVENOUS | Status: DC | PRN
  Administered 2019-07-04: 40 mg via INTRAVENOUS

## 2019-07-04 MED ORDER — FENTANYL CITRATE (PF) 100 MCG/2ML IJ SOLN: 25.0000 ug | INTRAMUSCULAR | Status: DC | PRN

## 2019-07-04 MED ORDER — PROPOFOL 10 MG/ML IV BOLUS
INTRAVENOUS | Status: DC | PRN
  Administered 2019-07-04 (×6): 10 mg via INTRAVENOUS

## 2019-07-04 MED ORDER — KETAMINE HCL 10 MG/ML IJ SOLN
INTRAMUSCULAR | Status: DC | PRN
  Administered 2019-07-04 (×2): 10 mg via INTRAVENOUS

## 2019-07-04 MED ORDER — CHLORHEXIDINE GLUCONATE CLOTH 2 % EX PADS: 6.0000 | MEDICATED_PAD | Freq: Once | CUTANEOUS | Status: DC

## 2019-07-04 MED ORDER — KETAMINE HCL 10 MG/ML IJ SOLN
INTRAMUSCULAR | Status: AC
  Filled 2019-07-04: qty 1

## 2019-07-04 MED ORDER — ONDANSETRON HCL 4 MG/2ML IJ SOLN
INTRAMUSCULAR | Status: DC | PRN
  Administered 2019-07-04: 4 mg via INTRAVENOUS

## 2019-07-04 MED ORDER — LIDOCAINE HCL (PF) 1 % IJ SOLN
INTRAMUSCULAR | Status: AC
  Filled 2019-07-04: qty 30

## 2019-07-04 SURGICAL SUPPLY — 37 items
APPLICATOR CHLORAPREP 10.5 ORG (MISCELLANEOUS) ×2 IMPLANT
BAG DECANTER FOR FLEXI CONT (MISCELLANEOUS) ×2 IMPLANT
CLOTH BEACON ORANGE TIMEOUT ST (SAFETY) ×2 IMPLANT
COVER LIGHT HANDLE STERIS (MISCELLANEOUS) ×4 IMPLANT
COVER PROBE U/S 5X48 (MISCELLANEOUS) ×1 IMPLANT
COVER WAND RF STERILE (DRAPES) ×2 IMPLANT
DECANTER SPIKE VIAL GLASS SM (MISCELLANEOUS) ×2 IMPLANT
DERMABOND ADVANCED (GAUZE/BANDAGES/DRESSINGS) ×2
DERMABOND ADVANCED .7 DNX12 (GAUZE/BANDAGES/DRESSINGS) ×1 IMPLANT
DRAPE C-ARM FOLDED MOBILE STRL (DRAPES) ×2 IMPLANT
ELECT REM PT RETURN 9FT ADLT (ELECTROSURGICAL) ×2
ELECTRODE REM PT RTRN 9FT ADLT (ELECTROSURGICAL) ×1 IMPLANT
GLOVE BIO SURGEON STRL SZ 6.5 (GLOVE) ×2 IMPLANT
GLOVE BIOGEL PI IND STRL 6.5 (GLOVE) ×1 IMPLANT
GLOVE BIOGEL PI IND STRL 7.0 (GLOVE) ×1 IMPLANT
GLOVE BIOGEL PI INDICATOR 6.5 (GLOVE) ×1
GLOVE BIOGEL PI INDICATOR 7.0 (GLOVE) ×1
GLOVE ECLIPSE 6.5 STRL STRAW (GLOVE) ×1 IMPLANT
GOWN STRL REUS W/TWL LRG LVL3 (GOWN DISPOSABLE) ×4 IMPLANT
IV NS 500ML (IV SOLUTION) ×1
IV NS 500ML BAXH (IV SOLUTION) ×1 IMPLANT
KIT PORT POWER 8FR ISP MRI (Port) ×3 IMPLANT
KIT TURNOVER KIT A (KITS) ×2 IMPLANT
MANIFOLD NEPTUNE II (INSTRUMENTS) ×2 IMPLANT
NDL HYPO 25X1 1.5 SAFETY (NEEDLE) ×1 IMPLANT
NEEDLE HYPO 25X1 1.5 SAFETY (NEEDLE) ×2 IMPLANT
PACK MINOR (CUSTOM PROCEDURE TRAY) ×2 IMPLANT
PAD ARMBOARD 7.5X6 YLW CONV (MISCELLANEOUS) ×2 IMPLANT
SET BASIN LINEN APH (SET/KITS/TRAYS/PACK) ×2 IMPLANT
SHEATH COOK PEEL AWAY SET 8F (SHEATH) ×1 IMPLANT
SUT MNCRL AB 4-0 PS2 18 (SUTURE) ×2 IMPLANT
SUT PROLENE 2 0 SH 30 (SUTURE) ×2 IMPLANT
SUT VIC AB 3-0 SH 27 (SUTURE) ×1
SUT VIC AB 3-0 SH 27X BRD (SUTURE) ×1 IMPLANT
SYR 10ML LL (SYRINGE) ×4 IMPLANT
SYR 5ML LL (SYRINGE) ×2 IMPLANT
SYR CONTROL 10ML LL (SYRINGE) ×2 IMPLANT

## 2019-07-04 NOTE — Interval H&P Note (Signed)
History and Physical Interval Note:  07/04/2019 7:20 AM  Patricia Hurst  has presented today for surgery, with the diagnosis of Colon Cancer.  The various methods of treatment have been discussed with the patient and family. After consideration of risks, benefits and other options for treatment, the patient has consented to  Procedure(s): INSERTION PORT-A-CATH as a surgical intervention.  The patient's history has been reviewed, patient examined, no change in status, stable for surgery.  I have reviewed the patient's chart and labs.  Questions were answered to the patient's satisfaction.    No changes. Plan to start on left.  Virl Cagey

## 2019-07-04 NOTE — Transfer of Care (Signed)
Immediate Anesthesia Transfer of Care Note  Patient: Patricia Hurst  Procedure(s) Performed: INSERTION PORT-A-CATH (Left Chest)  Patient Location: PACU  Anesthesia Type:General  Level of Consciousness: awake, alert , oriented and patient cooperative  Airway & Oxygen Therapy: Patient Spontanous Breathing  Post-op Assessment: Report given to RN and Post -op Vital signs reviewed and stable  Post vital signs: Reviewed and stable  Last Vitals:  Vitals Value Taken Time  BP    Temp    Pulse 77 07/04/19 0834  Resp    SpO2 99 % 07/04/19 0834  Vitals shown include unvalidated device data.  Last Pain:  Vitals:   07/04/19 0651  TempSrc: Oral  PainSc:       Patients Stated Pain Goal: 8 ([id number])  Complications: No apparent anesthesia complications

## 2019-07-04 NOTE — Discharge Instructions (Signed)
Take your antibiotic as prescribed. Normally patients are not given antibiotics after a porta catheter was placed but there was as small break in sterile technique during the case and this is preventative. There is a slight increase in the risk of infection in the first week or so after surgery but this is very small increase and unlikely.    Keep area clean and dry. You can take a shower in 24 hours. Do not submerge in water.  Take tylenol and ibuprofen for pain control. It is ok to ice the area and you may have bruising in the area. If you need stronger pain medication please notify the office.   Implanted Port Insertion, Care After This sheet gives you information about how to care for yourself after your procedure. Your health care provider may also give you more specific instructions. If you have problems or questions, contact your health care provider. What can I expect after the procedure? After the procedure, it is common to have:  Discomfort at the port insertion site.  Bruising on the skin over the port. This should improve over 3-4 days. Follow these instructions at home: [facility] care  After your port is placed, you will get a manufacturer's information card. The card has information about your port. Keep this card with you at all times.  Take care of the port as told by your health care provider. Ask your health care provider if you or a family member can get training for taking care of the port at home. A home health care nurse may also take care of the port.  Make sure to remember what type of port you have. Incision care      Follow instructions from your health care provider about how to take care of your port insertion site. Make sure you: ? Wash your hands with soap and water before and after you change your bandage (dressing). If soap and water are not available, use hand sanitizer. ? Change your dressing as told by your health care provider. ? Leave stitches (sutures),  skin glue, or adhesive strips in place. These skin closures may need to stay in place for 2 weeks or longer. If adhesive strip edges start to loosen and curl up, you may trim the loose edges. Do not remove adhesive strips completely unless your health care provider tells you to do that.  Check your port insertion site every day for signs of infection. Check for: ? Redness, swelling, or pain. ? Fluid or blood. ? Warmth. ? Pus or a bad smell. Activity  Return to your normal activities as told by your health care provider. Ask your health care provider what activities are safe for you.  Do not lift anything that is heavier than 10 lb (4.5 kg), or the limit that you are told, until your health care provider says that it is safe. General instructions  Take over-the-counter and prescription medicines only as told by your health care provider.  Do not take baths, swim, or use a hot tub until your health care provider approves. Ask your health care provider if you may take showers. You may only be allowed to take sponge baths.  Do not drive for 24 hours if you were given a sedative during your procedure.  Wear a medical alert bracelet in case of an emergency. This will tell any health care providers that you have a port.  Keep all follow-up visits as told by your health care provider. This is important. Contact  a health care provider if:  You cannot flush your port with saline as directed, or you cannot draw blood from the port.  You have a fever or chills.  You have redness, swelling, or pain around your port insertion site.  You have fluid or blood coming from your port insertion site.  Your port insertion site feels warm to the touch.  You have pus or a bad smell coming from the port insertion site. Get help right away if:  You have chest pain or shortness of breath.  You have bleeding from your port that you cannot control. Summary  Take care of the port as told by your health  care provider. Keep the manufacturer's information card with you at all times.  Change your dressing as told by your health care provider.  Contact a health care provider if you have a fever or chills or if you have redness, swelling, or pain around your port insertion site.  Keep all follow-up visits as told by your health care provider. This information is not intended to replace advice given to you by your health care provider. Make sure you discuss any questions you have with your health care provider. Document Revised: 11/08/2017 Document Reviewed: 11/08/2017 Elsevier Patient Education  [place] An implanted port is a device that is placed under the skin. It is usually placed in the chest. The device can be used to give IV medicine, to take blood, or for dialysis. You may have an implanted port if:  You need IV medicine that would be irritating to the small veins in your hands or arms.  You need IV medicines, such as antibiotics, for a long period of time.  You need IV nutrition for a long period of time.  You need dialysis. Having a port means that your health care provider will not need to use the veins in your arms for these procedures. You may have fewer limitations when using a port than you would if you used other types of long-term IVs, and you will likely be able to return to normal activities after your incision heals. An implanted port has two main parts:  Reservoir. The reservoir is the part where a needle is inserted to give medicines or draw blood. The reservoir is round. After it is placed, it appears as a small, raised area under your skin.  Catheter. The catheter is a thin, flexible tube that connects the reservoir to a vein. Medicine that is inserted into the reservoir goes into the catheter and then into the vein. How is my port accessed? To access your port:  A numbing cream may be placed on the skin over the port  site.  Your health care provider will put on a mask and sterile gloves.  The skin over your port will be cleaned carefully with a germ-killing soap and allowed to dry.  Your health care provider will gently pinch the port and insert a needle into it.  Your health care provider will check for a blood return to make sure the port is in the vein and is not clogged.  If your port needs to remain accessed to get medicine continuously (constant infusion), your health care provider will place a clear bandage (dressing) over the needle site. The dressing and needle will need to be changed every week, or as told by your health care provider. What is flushing? Flushing helps keep the port from getting clogged. Follow instructions from  your health care provider about how and when to flush the port. Ports are usually flushed with saline solution or a medicine called heparin. The need for flushing will depend on how the port is used:  If the port is only used from time to time to give medicines or draw blood, the port may need to be flushed: ? Before and after medicines have been given. ? Before and after blood has been drawn. ? As part of routine maintenance. Flushing may be recommended every 4-6 weeks.  If a constant infusion is running, the port may not need to be flushed.  Throw away any syringes in a disposal container that is meant for sharp items (sharps container). You can buy a sharps container from a pharmacy, or you can make one by using an empty hard plastic bottle with a cover. How long will my port stay implanted? The port can stay in for as long as your health care provider thinks it is needed. When it is time for the port to come out, a surgery will be done to remove it. The surgery will be similar to the procedure that was done to put the port in. Follow these instructions at home:   Flush your port as told by your health care provider.  If you need an infusion over several days,  follow instructions from your health care provider about how to take care of your port site. Make sure you: ? Wash your hands with soap and water before you change your dressing. If soap and water are not available, use alcohol-based hand sanitizer. ? Change your dressing as told by your health care provider. ? Place any used dressings or infusion bags into a plastic bag. Throw that bag in the trash. ? Keep the dressing that covers the needle clean and dry. Do not get it wet. ? Do not use scissors or sharp objects near the tube. ? Keep the tube clamped, unless it is being used.  Check your port site every day for signs of infection. Check for: ? Redness, swelling, or pain. ? Fluid or blood. ? Pus or a bad smell.  Protect the skin around the port site. ? Avoid wearing bra straps that rub or irritate the site. ? Protect the skin around your port from seat belts. Place a soft pad over your chest if needed.  Bathe or shower as told by your health care provider. The site may get wet as long as you are not actively receiving an infusion.  Return to your normal activities as told by your health care provider. Ask your health care provider what activities are safe for you.  Carry a medical alert card or wear a medical alert bracelet at all times. This will let health care providers know that you have an implanted port in case of an emergency. Get help right away if:  You have redness, swelling, or pain at the port site.  You have fluid or blood coming from your port site.  You have pus or a bad smell coming from the port site.  You have a fever. Summary  Implanted ports are usually placed in the chest for long-term IV access.  Follow instructions from your health care provider about flushing the port and changing bandages (dressings).  Take care of the area around your port by avoiding clothing that puts pressure on the area, and by watching for signs of infection.  Protect the skin  around your port from seat belts. Place  a soft pad over your chest if needed.  Get help right away if you have a fever or you have redness, swelling, pain, drainage, or a bad smell at the port site. This information is not intended to replace advice given to you by your health care provider. Make sure you discuss any questions you have with your health care provider. Document Revised: 08/04/2018 Document Reviewed: 05/15/2016 Elsevier Patient Education  2020 [place] After These instructions provide you with information about caring for yourself after your procedure. Your health care provider may also give you more specific instructions. Your treatment has been planned according to current medical practices, but problems sometimes occur. Call your health care provider if you have any problems or questions after your procedure. What can I expect after the procedure? After your procedure, you may:  Feel sleepy for several hours.  Feel clumsy and have poor balance for several hours.  Feel forgetful about what happened after the procedure.  Have poor judgment for several hours.  Feel nauseous or vomit.  Have a sore throat if you had a breathing tube during the procedure. Follow these instructions at home: For at least 24 hours after the procedure:      Have a responsible adult stay with you. It is important to have someone help care for you until you are awake and alert.  Rest as needed.  Do not: ? Participate in activities in which you could fall or become injured. ? Drive. ? Use heavy machinery. ? Drink alcohol. ? Take sleeping pills or medicines that cause drowsiness. ? Make important decisions or sign legal documents. ? Take care of children on your own. Eating and drinking  Follow the diet that is recommended by your health care provider.  If you vomit, drink water, juice, or soup when you can drink without vomiting.  Make  sure you have little or no nausea before eating solid foods. General instructions  Take over-the-counter and prescription medicines only as told by your health care provider.  If you have sleep apnea, surgery and certain medicines can increase your risk for breathing problems. Follow instructions from your health care provider about wearing your sleep device: ? Anytime you are sleeping, including during daytime naps. ? While taking prescription pain medicines, sleeping medicines, or medicines that make you drowsy.  If you smoke, do not smoke without supervision.  Keep all follow-up visits as told by your health care provider. This is important. Contact a health care provider if:  You keep feeling nauseous or you keep vomiting.  You feel light-headed.  You develop a rash.  You have a fever. Get help right away if:  You have trouble breathing. Summary  For several hours after your procedure, you may feel sleepy and have poor judgment.  Have a responsible adult stay with you for at least 24 hours or until you are awake and alert. This information is not intended to replace advice given to you by your health care provider. Make sure you discuss any questions you have with your health care provider. Document Revised: 07/11/2017 Document Reviewed: 08/03/2015 Elsevier Patient Education  [place].

## 2019-07-04 NOTE — Anesthesia Postprocedure Evaluation (Signed)
Anesthesia Post Note  Patient: Patricia Hurst  Procedure(s) Performed: INSERTION PORT-A-CATH LEFT CHEST  ATTACHED WITH TUNNELED CATHETER IN LEFT IJ (Left Chest)  Anesthesia Type: General     Last Vitals:  Vitals:   07/04/19 0915 07/04/19 0925  BP: (!) 160/83 (!) 147/65  Pulse: 64 68  Resp: 13 16  Temp:  36.7 C  SpO2: 100% 99%    Last Pain:  Vitals:   07/04/19 0925  TempSrc: Oral  PainSc: [place]

## 2019-07-04 NOTE — Op Note (Signed)
Operative Note 07/04/19   Preoperative Diagnosis:  Colon cancer    Postoperative Diagnosis: Same   Procedure(s) Performed: Port-A-Cath placement, left internal jugular    Surgeon: Lanell Matar. Constance Haw, MD   Assistants: No qualified resident was available   Anesthesia: Monitored anesthesia care   Anesthesiologist: Dr. Briant Cedar, MD    Specimens: None   Estimated Blood Loss: Minimal   Fluoroscopy time: 36 seconds   Blood Replacement: None    Complications: None    Operative Findings:  Unable to thread dilator and wire into subclavian vein on left, left jugular vein with normal anatomy; break in sterile technique during placement (new catheter used, betadine preparation and new gloves)   Indications: Patricia Hurst is a very sweet 74 yo with colon cancer who is going to need to undergo chemotherapy treatment. She has been healing after a laparoscopic right hemicolectomy and has been doing well overall. We discussed placement of the port a catheter and risk of bleeding, infection, and pneumothorax. She opted to proceed.   Procedure: The patient was brought into the operating room and monitored anesthesia care was induced.  One percent lidocaine was used for local anesthesia.   The left chest and neck was prepped and draped in the usual sterile fashion.  Preoperative antibiotics were given.  An incision was made below the left clavicle. A subcutaneous pocket was formed. The needles advanced into the left subclavian vein using the Seldinger technique without difficulty. A guidewire was then advanced but coiled a few times. This was redirected and the arm was pulled down by the side with the patient in Trendelenburg. This helped and the wire was advanced under fluoroscopic guidance. When the dilator was being placed the wire would not pull through the dilator and was being kinked. Given this, the subclavian approach was aborted.  I then used ultrasound guidance to enter the left jugular vein. This was  entered and a guide wire was placed under fluoroscopic guidance  into the right atrium.  Ectopia was not noted. An introducer and peel-away sheath were placed over the guidewire.  The introducer has been removed. The catheter was getting ready to be inserted and the patient began to cough. Anesthesia was attempting to help the patient and went to lift the jaw and the sterile field was slightly broken under the drapes with gloved finger tips at the edge of the jawline. No equipment was touched but to be safe we paused the procedure.   Immediately a sterile towel was placed in the location of the sterile breach.  The catheter was removed and betadine preparation was placed in the entire field and over the peel away sheath.  New outer gloves were placed.  Given the break in the sterile field, a new catheter was opened and placed through the peel-away sheath.  The peel-away sheath was removed.  A spot film was performed to confirm the position. The catheter was then attached to the port and the port placed in subcutaneous pocket. Adequate positioning was confirmed by fluoroscopy. Hemostasis was confirmed, and the port was secured with 2-0 prolene sutures.  Good backflow of blood was noted on aspiration of the port. The port was flushed with heparin flush. Subcutaneous layer was reapproximated using a 3-0 Vicryl interrupted suture. The skin was closed using a 4-0 Vicryl subcuticular suture. Dermabond was applied.    All tape and needle counts were correct at the end of the procedure. The patient was transferred to PACU in stable condition. A chest x-ray  will be performed at that time.  I think it is unlikely that the equipment was contaminated but given the risk I will place her on a 5 day course of keflex.   Patricia Labrum, MD [facility] [address] [place], [place] [address] [phone] (office)

## 2019-07-04 NOTE — Anesthesia Preprocedure Evaluation (Signed)
Anesthesia Evaluation  Patient identified by MRN, date of birth, ID band Patient awake    Reviewed: Allergy & Precautions, H&P , NPO status , Patient's Chart, lab work & pertinent test results, reviewed documented beta blocker date and time   History of Anesthesia Complications (+) PONV and history of anesthetic complications  Airway Mallampati: II  TM Distance: >3 FB Neck ROM: full    Dental  (+) Teeth Intact   Pulmonary neg pulmonary ROS,    Pulmonary exam normal        Cardiovascular negative cardio ROS Normal cardiovascular exam     Neuro/Psych PSYCHIATRIC DISORDERS Anxiety negative neurological ROS     GI/Hepatic negative GI ROS, Neg liver ROS,   Endo/Other  Hypothyroidism   Renal/GU negative Renal ROS  negative genitourinary   Musculoskeletal negative musculoskeletal ROS (+)   Abdominal   Peds negative pediatric ROS (+)  Hematology  (+) Blood dyscrasia, anemia ,   Anesthesia Other Findings   Reproductive/Obstetrics negative OB ROS                             Anesthesia Physical Anesthesia Plan  ASA: II  Anesthesia Plan: General   Post-op Pain Management:    Induction:   PONV Risk Score and Plan: 3 and Propofol infusion  Airway Management Planned:   Additional Equipment:   Intra-op Plan:   Post-operative Plan:   Informed Consent: I have reviewed the patients History and Physical, chart, labs and discussed the procedure including the risks, benefits and alternatives for the proposed anesthesia with the patient or authorized representative who has indicated his/her understanding and acceptance.       Plan Discussed with: CRNA  Anesthesia Plan Comments:         Anesthesia Quick Evaluation

## 2019-07-04 NOTE — Progress Notes (Signed)
[facility]  Updated family and patient regarding port. CXR looks good. Notified them of accidental break in sterile field and precautions taken in the OR as well as plan for Keflex. Will call the patient next week.  I think it is highly unlikely any of the equipment was contaminated and a new catheter was used and betadine preparation was done after the break. This is more of a precaution. Warned to look out for fever, chills, redness. Expect more bruising as the tunneler was used twice due to changing out the catheter.  Curlene Labrum, MD [facility] [address] [place], [place] [address] [phone] (office)

## 2019-07-05 ENCOUNTER — Ambulatory Visit (HOSPITAL_COMMUNITY)
Admission: RE | Admit: 2019-07-05 | Discharge: 2019-07-05 | Disposition: A | Discharge status: Home / Self Care | Payer: Medicare HMO | Source: Ambulatory Visit | Attending: Hematology | Admitting: Hematology

## 2019-07-05 DIAGNOSIS — C182 Malignant neoplasm of ascending colon: Secondary | ICD-10-CM

## 2019-07-05 DIAGNOSIS — C189 Malignant neoplasm of colon, unspecified: Secondary | ICD-10-CM | POA: Diagnosis not present

## 2019-07-05 MED ORDER — IOHEXOL 300 MG/ML  SOLN
75.0000 mL | Freq: Once | INTRAMUSCULAR | Status: AC | PRN
  Administered 2019-07-05: 75 mL via INTRAVENOUS

## 2019-07-09 ENCOUNTER — Inpatient Hospital Stay (HOSPITAL_COMMUNITY): Payer: Medicare HMO | Admitting: Hematology

## 2019-07-09 ENCOUNTER — Encounter (HOSPITAL_COMMUNITY): Payer: Self-pay | Admitting: Hematology

## 2019-07-09 ENCOUNTER — Other Ambulatory Visit: Payer: Self-pay

## 2019-07-09 VITALS — BP 138/77 | HR 76 | Temp 96.8°F | Resp 18 | Wt 136.4 lb

## 2019-07-09 DIAGNOSIS — R197 Diarrhea, unspecified: Secondary | ICD-10-CM | POA: Diagnosis not present

## 2019-07-09 DIAGNOSIS — E7800 Pure hypercholesterolemia, unspecified: Secondary | ICD-10-CM | POA: Diagnosis not present

## 2019-07-09 DIAGNOSIS — E039 Hypothyroidism, unspecified: Secondary | ICD-10-CM | POA: Diagnosis not present

## 2019-07-09 DIAGNOSIS — R112 Nausea with vomiting, unspecified: Secondary | ICD-10-CM | POA: Diagnosis not present

## 2019-07-09 DIAGNOSIS — F419 Anxiety disorder, unspecified: Secondary | ICD-10-CM | POA: Diagnosis not present

## 2019-07-09 DIAGNOSIS — C182 Malignant neoplasm of ascending colon: Secondary | ICD-10-CM

## 2019-07-09 DIAGNOSIS — R5383 Other fatigue: Secondary | ICD-10-CM | POA: Diagnosis not present

## 2019-07-09 DIAGNOSIS — D649 Anemia, unspecified: Secondary | ICD-10-CM | POA: Diagnosis not present

## 2019-07-09 DIAGNOSIS — C183 Malignant neoplasm of hepatic flexure: Secondary | ICD-10-CM | POA: Diagnosis not present

## 2019-07-09 DIAGNOSIS — H409 Unspecified glaucoma: Secondary | ICD-10-CM | POA: Diagnosis not present

## 2019-07-09 MED ORDER — CAPECITABINE 500 MG PO TABS: 1500.0000 mg | ORAL_TABLET | Freq: Two times a day (BID) | ORAL | 1 refills | Status: DC

## 2019-07-09 NOTE — Patient Instructions (Addendum)
[place] at [facility] Discharge Instructions  You were seen today by Dr. Delton Coombes. He went over your recent lab results. He discussed your treatment and the medications you will receive. Your cancer medication will be mailed to you. He will see you back in 1 week for labs, treatment and follow up.   Thank you for choosing [place] at [facility] to provide your oncology and hematology care.  To afford each patient quality time with our provider, please arrive at least 15 minutes before your scheduled appointment time.   If you have a lab appointment with the [facility] please come in thru the  Main Entrance and check in at the main information desk  You need to re-schedule your appointment should you arrive 10 or more minutes late.  We strive to give you quality time with our providers, and arriving late affects you and other patients whose appointments are after yours.  Also, if you no show three or more times for appointments you may be dismissed from the clinic at the providers discretion.     Again, thank you for choosing [facility].  Our hope is that these requests will decrease the amount of time that you wait before being seen by our physicians.       _____________________________________________________________  Should you have questions after your visit to [facility], please contact our office at [phone] between the hours of 8:00 a.m. and 4:30 p.m.  Voicemails left after 4:00 p.m. will not be returned until the following business day.  For prescription refill requests, have your pharmacy contact our office and allow 72 hours.    Cancer Center Support Programs:   > Cancer Support Group  2nd Tuesday of the month 1pm-2pm, Journey Room

## 2019-07-09 NOTE — Progress Notes (Signed)
I met with patient during the visit today with Dr. Delton Coombes. I provided patient with written education material on xeloda and oxaliplatin. I explained the overall nature of a day of treatment. I explained to her how a cycle runs and how she will take the medicine for 2 weeks followed by 1 week off.  I provided my contact information should she need to get in touch with me with any questions or concerns.  I explained that she will have a chemotherapy teaching class next week and she will get this same information in addition to other at that time. She verbalizes understanding.

## 2019-07-09 NOTE — Assessment & Plan Note (Signed)
1.  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 -We talked about the IDEA study findings which showed 3 months of [place] ox regimen being noninferior to 6 months of FOLFOX in the early stage colon cancer. -We talked about the side effects of capecitabine which can be slightly worse than 5-FU.  We talked about the side effects including hand-foot skin reaction in detail. -We will start her on CapeOx regimen as soon as we can get the medication.  She will take capecitabine 850 mg per metered squared twice daily 2 weeks on 1 week off.  Oxaliplatin will be given 130 mg per metered square on day 1 every 21 days.  We discussed the side effects of oxaliplatin in detail. -She already has a port placed.  I have discussed the results of the CT of the chest which did not show any metastatic disease.  CEA was normal. -We will likely proceed next week with her first treatment.  2.  Normocytic anemia: -We reviewed labs from 06/27/2019.  Hemoglobin improved to 10.3 from 7.5 previously.  Ferritin, L19 and folic acid were normal. -We will consider Feraheme infusions during treatment if there is any drop of hemoglobin.  3.  Family history: -Father and sister had brain tumors.  Another sister died of breast cancer in her 17s.  2 sisters had malignant melanoma.  Niece died of breast cancer. -I have recommended genetic testing because of her extensive family history.
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
## 2019-07-09 NOTE — Progress Notes (Signed)
START ON PATHWAY REGIMEN - Colorectal     A cycle is every 21 days:     Capecitabine      Oxaliplatin   **Always confirm dose/schedule in your pharmacy ordering system**  Patient Characteristics: Postoperative without Neoadjuvant Therapy (Pathologic Staging), Colon, Stage III, Low Risk (pT1-3, pN1) Tumor Location: Colon Therapeutic Status: Postoperative without Neoadjuvant Therapy (Pathologic Staging) AJCC M Category: cM0 AJCC T Category: pT3 AJCC N Category: pN1c AJCC 8 Stage Grouping: IIIB Intent of Therapy: Curative Intent, Discussed with Patient

## 2019-07-10 ENCOUNTER — Ambulatory Visit (INDEPENDENT_AMBULATORY_CARE_PROVIDER_SITE_OTHER): Payer: Self-pay | Admitting: General Surgery

## 2019-07-10 ENCOUNTER — Telehealth (HOSPITAL_COMMUNITY): Payer: Self-pay | Admitting: Pharmacist

## 2019-07-10 ENCOUNTER — Inpatient Hospital Stay (HOSPITAL_COMMUNITY): Payer: Medicare HMO | Admitting: General Practice

## 2019-07-10 ENCOUNTER — Telehealth (HOSPITAL_COMMUNITY): Payer: Self-pay | Admitting: Pharmacy Technician

## 2019-07-10 DIAGNOSIS — C182 Malignant neoplasm of ascending colon: Secondary | ICD-10-CM

## 2019-07-10 MED ORDER — CAPECITABINE 500 MG PO TABS: 1500.0000 mg | ORAL_TABLET | Freq: Two times a day (BID) | ORAL | 1 refills | Status: DC

## 2019-07-10 NOTE — Telephone Encounter (Signed)
Oral Oncology Pharmacist Encounter  Received new prescription for Xeloda (capecitabine) for the treatment of colon cancer in conjunction with oxaliplatin, planned duration 3 months or until unacceptable drug toxicity.  CMP from 06/27/19 assessed, no relevant lab abnormalities. Prescription dose and frequency assessed.   Current medication list in Epic reviewed, no DDIs with capecitabine identified.  Prescription has been e-scribed to the [facility] for benefits analysis and approval.  Oral Oncology Clinic will continue to follow for insurance authorization, copayment issues, initial counseling and start date.  Darl Pikes, PharmD, BCPS, BCOP, CPP Hematology/Oncology Clinical Pharmacist [facility] [place] [facility] [phone]  07/10/2019 10:27 AM

## 2019-07-10 NOTE — Telephone Encounter (Signed)
Oral Oncology Patient Advocate Encounter   Received notification from [facility] that prior authorization for Xeloda is required.   PA submitted on CoverMyMeds Key [id number] Status is pending   Oral Oncology Clinic will continue to follow.  [place] Patient [place] Phone [phone] Fax [phone] 07/11/2019 10:50 AM

## 2019-07-10 NOTE — Progress Notes (Signed)
[facility] Initial Psychosocial Assessment Clinical Social Work  Clinical Social Work contacted by phone to assess psychosocial, emotional, mental health, and spiritual needs of the patient.   Barriers to care/review of distress screen:  - Transportation:  Do you anticipate any problems getting to appointments?  Do you have someone who can help run errands for you if you need it?  No problems, has husband, two sons and a daughter who will rotate taking her to appointments.  Would like to drive herself as much as possible.   - Help at home:  What is your living situation (alone, family, other)?  If you are physically unable to care for yourself, who would you call on to help you?  Lives w husband.  Husband has Stage IV NALD - familial liver disease.  "Most days he is good, some days if his ammonia level is off, he's confused."  Children are helpful, good back up.   - Support system:  What does your support system look like?  Who would you call on if you needed some kind of practical help?  What if you needed someone to talk to for emotional support?  Children are supportive and live nearby.  Keeps daughters two children (ages 20 and 52).   - Finances:  Are you concerned about finances.  Considering returning to work?  If not, applying for disability?  Patient and husband are retired.  No significant financial concerns at this time.  Have outstanding medical bills, need payment plans for bills.  Have been to DSS, has qualified for [employer].  Tried to get Medicaid, but they were not able to qualify.  Are in process of filing for Medicaid.  Daughter is helping her file for Medicaid.    What is your understanding of where you are with your cancer? Its cause?  Your treatment plan and what happens next?  Diagnosis of cancer was unexpected, was found through screening for colon cancer w PCP.  Was set up for colonoscopy, one polyp was positive for cancer.  "I am a person of faith and you are thankful for every day, I  just go on."  Tries to stay positive.  Diagnosis of cancer was a "shock" to children, "they took it hard."  States she has people praying for her treatments, "that they will not make me sick."    What are your worries for the future as you begin treatment for cancer?  "The unknown."  Has a lot of cancer in her family, other family members have had multiple cancers - brain, breast and others.  She does have concerns/worries, but "I have a great faith, church, prayer, friends."    What are your hopes and priorities during your treatment? What is important to you? What are your goals for your care?  Wants to watch grandchildren but aware there may be days she is unable to do this.  "Wants to keep a positive outlook."  Is a "country girl", used to eating favorite foods like "pinto beans and mashed potatoes", not used to much variety.  Will take it "day by day" and try to find foods that will work.  Has difficulties drinking water due to bloating.  Asked patient to let us know if she is having continued difficulties w diet/hydration so we can give her resources to assist.   CSW Summary:  Patient and family psychosocial functioning including strengths, limitations, and coping skills:  Patient is a 74 year old female, recently diagnosed with colon cancer.  Lives w husband, both are retired.  Husband has liver disease - patient has been the "on call" caregiver if/when his symptoms have increased.  As she is now facing cancer treatment herself, she has spoken w her children who are able to help as needed.  She is a person of faith, draws on her resources of prayer and fellowship.  Keeps a positive attitude, is praying that she will not experience side effects from chemotherapy and that treatments will be successful.  Tries to focus on the positives, being thankful for what she has.  Admits to some understandable worries, but tries to combat these by prayer and listening to uplifting spiritual music.  Is a bit  concerned about proper diet during treatment - is used to certain foods, has not branched out to eat different things.  Is willing to explore new food items, and will do her best to maintain good nutrition/hydration despite the challenges.  Has great support, positive outlook, appreciates care she has received at [facility].    Identifications of barriers to care:  Is sometimes a caregiver for her husband - his symptoms sometimes lead to mental confusion.  This is unpredictable.  Has children who are nearby and able to assist as needed.    Availability of community resources:  Family is helping her apply for Medicaid w [employer], has Food Stamps to supplement her budget.    Clinical Social Worker follow up needed: No.  Patient is welcome to call as needed for support/resources.    Edwyna Shell, LCSW Clinical Social Worker Phone:  [phone] Cell:  [phone]

## 2019-07-11 NOTE — Telephone Encounter (Signed)
Oral Oncology Patient Advocate Encounter  Prior Authorization for Xeloda has been approved.    PA# [id number] Effective dates: 07/10/19 through 04/25/20  Patients co-pay is $17.12- billed under Medicare B benefit.  Oral Oncology Clinic will continue to follow.   [facility] [place] Phone [phone] Fax [phone] 07/11/2019 3:37 PM

## 2019-07-11 NOTE — Progress Notes (Signed)
[facility]  I am calling the patient for post operative evaluation due to the current COVID 19 pandemic.  The patient had a port a catheter placed on 3/10.  There was a break in the sterile field and a round of antibiotics were given to be cautious. She is having no fever, chills, no redness, she has some bruising. She had a CT chest the following day without metastasis and port in expected position. The patient reports that they are doing well. She has completed the 5 day course of antibiotics. She should be good for proceeding with treatment. She will notify me with issues.   Will see the patient PRN.   Curlene Labrum, MD [facility] [address] [place], [place] [address] [phone] (office)

## 2019-07-12 ENCOUNTER — Inpatient Hospital Stay (HOSPITAL_COMMUNITY): Payer: Medicare HMO

## 2019-07-12 MED ORDER — XELODA 500 MG PO TABS: 1500.0000 mg | ORAL_TABLET | Freq: Two times a day (BID) | ORAL | 1 refills | Status: DC

## 2019-07-13 ENCOUNTER — Encounter (HOSPITAL_COMMUNITY): Payer: Self-pay

## 2019-07-13 DIAGNOSIS — Z95828 Presence of other vascular implants and grafts: Secondary | ICD-10-CM

## 2019-07-13 HISTORY — DX: Presence of other vascular implants and grafts: Z95.828

## 2019-07-13 MED ORDER — PROCHLORPERAZINE MALEATE 10 MG PO TABS: 10.0000 mg | ORAL_TABLET | Freq: Four times a day (QID) | ORAL | 1 refills | Status: DC | PRN

## 2019-07-13 MED ORDER — LIDOCAINE-PRILOCAINE 2.5-2.5 % EX CREA: TOPICAL_CREAM | CUTANEOUS | 3 refills | Status: DC

## 2019-07-13 MED FILL — CAPECITABINE 500 MG TABLET: 500 | 21 days supply | Qty: 84 | Fill #0

## 2019-07-13 NOTE — Telephone Encounter (Signed)
Oral Chemotherapy Pharmacist Encounter  Patient knows to start Xeloda (capecitabine) on 07/19/2019. Patient verbalized understanding.   I spoke with patient for overview of: Xeloda (capecitabine) for the treatment of colon cancer cancer in conjunction with infusional oxaliplatin, planned duration of 3 months of therapy or until unacceptable drug toxicity.   Counseled patient on administration, dosing, side effects, monitoring, drug-food interactions, safe handling, storage, and disposal.  Patient will take Xeloda 500mg  tablets, 3 tablets (1500mg ) by mouth in AM and 3 tabs (1500mg ) by mouth in PM, within 30 minutes of finishing meals, on days 1-14 of each 21 day cycle.   Oxaliplatin will be infused on day 1 of each 21 day cycle.  Xeloda and oxaliplatin start date: 07/19/2019  Adverse effects include but are not limited to: fatigue, decreased blood counts, GI upset, diarrhea, mouth sores, and hand-foot syndrome.  Patient has anti-emetic on hand and knows to take it if nausea develops.   Patient will obtain anti diarrheal and alert the office of 4 or more loose stools above baseline.  Reviewed with patient importance of keeping a medication schedule and plan for any missed doses.  Medication reconciliation performed and medication/allergy list updated.  All questions answered.  Ms. Heninger voiced understanding and appreciation.   Patient knows to call the office with questions or concerns.  Leron Croak, PharmD, [place] PGY2 Hematology/Oncology Pharmacy Resident 07/13/2019 2:06 PM Oral Oncology Clinic [phone]

## 2019-07-13 NOTE — Patient Instructions (Addendum)
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  You will be treated with a chemotherapy regimen called CapeOx - the drugs included in this are capcitabine (Xeloda) and oxaliplatin.  You will take the Xeloda two weeks on and one week off. You will repeat this cycle every 3 weeks.  You will receive the oxaliplatin in the clinic every 21 days at the beginning of each cycle of chemotherapy.  You will see the doctor regularly throughout treatment.  We will obtain blood work from you prior to every treatment and monitor your results to make sure it is safe to give your treatment. The doctor monitors your response to treatment by the way you are feeling, your blood work, and by obtaining scans periodically. There will be wait times while you are here for treatment.  It will take about 30 minutes to 1 hour for your lab work to result.  Then there will be wait times while pharmacy mixes your medications.    Capecitabine (Xeloda)  About This Drug Capecitabine is used to treat cancer. It is given orally (by mouth).  You will take this drug for two weeks in a row, then have a one week break before restarting it again.   Possible Side Effects . Decrease in red blood cells. This may make you feel more tired.  . Nausea and throwing up (vomiting)  . Pain in your abdomen  . Diarrhea (loose bowel movements)  . Tiredness and weakness  . Increased total bilirubin in your blood. This may mean that you have changes in your liver function.  . Hand-foot syndrome. The palms of your hands or soles of your feet may tingle, become numb, painful, swollen, or red.  Note: Each of the side effects above was reported in 30% or greater of patients treated with capecitabine. Not all possible side effects are included above.  Warnings and Precautions . Abnormal bleeding if you are taking blood thinners such as warfarin - symptoms may be coughing up  blood, throwing up blood (may look like coffee grounds), red or black tarry bowel movements, abnormally heavy menstrual flow, nosebleeds or any other unusual bleeding.  . Severe diarrhea  . Changes in the tissue of the heart and/or heart attack. Some changes may happen that can cause your heart to have less ability to pump blood.  . Increased risk of severe side effects if you have a known dihydropyrimidine dehydrogenase deficiency  . Dehydration (lack of water in the body from losing too much fluid), which may affect how your kidneys work which can be life-threatening  . Severe allergic skin reaction. You may develop blisters on your skin that are filled with fluid or a severe red rash all over your body that may be painful.  . Decrease in the number of white blood cells, red blood cells, and platelets. This may raise your risk of infection, make you tired and weak (fatigue), and raise your risk of bleeding.  . Patients greater than 89 years of age are at increased risk of severe and life-threatening side effects.  . Increased bilirubin and changes in your liver function, which can cause liver failure  Note: Some of the side effects above are very rare. If you have concerns and/or questions, please discuss them with your medical team.  How to Take Your Medication . Swallow the medicine whole with water within 30 minutes after a meal. Do not break or crush it.  Marland Kitchen  Missed dose: If you vomit or miss a dose, take your next dose at the regular time, and contact your doctor. Do not take 2 doses at the same time and do not double up on the next dose.  Marland Kitchen Handling: Wash your hands after handling your medicine, your caretakers should not handle your medicine with bare hands and should wear latex gloves. . This drug may be present in the saliva, tears, sweat, urine, stool, vomit, semen, and vaginal secretions. Talk to your doctor and/or your nurse about the necessary precautions to take during this  time.  . Storage: Store this medicine in the original container at room temperature. Keep lid tightly closed.  . Disposal of unused medicine: Do not flush any expired and/or unused medicine down the toilet or drain unless you are specifically instructed to do so on the medication label. Some facilities have take-back programs and/or other options. If you do not have a take-back program in your area, then please discuss with your nurse or your doctor how to dispose of unused medicine.  Treating Side Effects . Drink plenty of fluids (a minimum of eight glasses per day is recommended).  . If you throw up or have loose bowel movements, you should drink more fluids so that you do not become dehydrated (lack of water in the body from losing too much fluid).  . If you have diarrhea, eat low-fiber foods that are high in protein and calories and avoid foods that can irritate your digestive tracts or lead to cramping.  . Ask your nurse or doctor about medicine that can lessen or stop your diarrhea.  . To help with nausea and vomiting, eat small, frequent meals instead of three large meals a day. Choose foods and drinks that are at room temperature. Ask your nurse or doctor about other helpful tips and medicine that is available to help stop or lessen these symptoms.  . Manage tiredness by pacing your activities for the day.  . Be sure to include periods of rest between energy-draining activities.  . To decrease the risk of infection, wash your hands regularly.  . Avoid close contact with people who have a cold, the flu, or other infections.  . Take your temperature as your doctor or nurse tells you, and whenever you feel like you may have a fever.  . To help decrease the risk of bleeding, use a soft toothbrush. Check with your nurse before using dental floss.  . Be very careful when using knives or tools.  . Use an electric shaver instead of a razor.  Marland Kitchen Keeping your pain under control is  important to your well-being. Please tell your doctor or nurse if you are experiencing pain.  . Avoid sun exposure and apply sunscreen routinely when outdoors.  . If you get a rash do not put anything on it unless your doctor or nurse says you may. Keep the area around the rash clean and dry. Ask your doctor for medicine if your rash bothers you.  Food and Drug Interactions . There are no known interactions of capecitabine with food, however this medication should be taken within 30 minutes after a meal.  . Check with your doctor or pharmacist about all other prescription medicines and over-the-counter medicines and dietary supplements (vitamins, minerals, herbs and others) you are taking before starting this medicine as there are known drug interactions with capecitabine. Also, check with your doctor or pharmacist before starting any new prescription or over-the-counter medicines, or dietary supplements  to make sure that there are no interactions.   . There are known interactions of capecitabine with blood thinning medicine such as warfarin. Ask your doctor what precautions you should take.  When to Call the Doctor  Call your doctor or nurse if you have any of these symptoms and/or any new or unusual symptoms: . Fever of 100.4 F (38 C) or higher  . Chills  . Trouble breathing  . Feeling that your heart is beating in a fast or not normal way (palpitations)  . Pain in your chest  . Chest pain or symptoms of a heart attack. Most heart attacks involve pain in the center of the chest that lasts more than a few minutes. The pain may go away and come back or it can be constant. It can feel like pressure, squeezing, fullness, or pain. Sometimes pain is felt in one or both arms, the back, neck, jaw, or stomach. If any of these symptoms last 2 minutes, call 911.  . Tiredness that interferes with your daily activities  . Feeling dizzy or lightheaded  . Easy bleeding or bruising  . Blood in  your urine, vomit (bright red or coffee-ground) and/or stools (bright red, or black/tarry)  . Coughing up blood  . Decreased or very dark urine  . Nausea that stops you from eating or drinking and/or is not relieved by prescribed medicines  . Throwing up  . Lasting loss of appetite or rapid weight loss of five pounds in a week  . Diarrhea, 4 times in one day or diarrhea with lack of strength or a feeling of being dizzy  . Pain that does not go away or is not relieved by prescribed medicines  . Signs of possible liver problems: dark urine, pale bowel movements, bad stomach pain, feeling very tired and weak, unusual itching, or yellowing of the eyes or skin  . Painful, red, or swollen areas on your hands or feet  . Numbness and/or tingling of your hands and/or feet  . A new rash or a rash that is not relieved by prescribed medicines  . Flu-like symptoms: fever, headache, muscle and joint aches, and fatigue (low energy, feeling weak)  . If you think you may be pregnant or may have impregnated your partner  Reproduction Warnings . Pregnancy warning: This drug can have harmful effects on the unborn baby. Women of childbearing potential should use effective methods of birth control during your cancer treatment and for 6 months after treatment. Men with female partners of childbearing potential should use effective methods of birth control during your cancer treatment and for 3 months after your cancer treatment. Let your doctor know right away if you think you may be pregnant or may have impregnated your partner.  . Breastfeeding warning: Women should not breastfeed during treatment and for 2 weeks after treatment because this drug could enter the breast milk and cause harm to a breastfeeding baby.   . Fertility warning: In men and women both, this drug may affect your ability to have children in the future. Talk with your doctor or nurse if you plan to have children. Ask for information  on sperm or egg banking.   Oxaliplatin (Eloxatin)  About This Drug Oxaliplatin is used to treat cancer. It is given in the vein (IV) through your port a cath.  It will take 2 hours to infuse.   Possible Side Effects . Bone marrow suppression. This is a decrease in the number of white blood cells,  red blood cells, and platelets. This may raise your risk of infection, make you tired and weak (fatigue), and raise your risk of bleeding.  . Tiredness  . Soreness of the mouth and throat. You may have red areas, white patches, or sores that hurt.  . Nausea and vomiting (throwing up)  . Diarrhea (loose bowel movements)  . Changes in your liver function  . Effects on the nerves called peripheral neuropathy. You may feel numbness, tingling, or pain in your hands and feet, and it may be worse in cold temperatures. It may be hard for you to button your clothes, open jars, or walk as usual. You may also have eye pain, jaw spasms, difficulty with swallowing and/or an abnormal feeling of your tongue as well as a heavy feeling in your chest. The effect on the nerves may get worse with more doses of the drug. These effects get better in some people after the drug is stopped but it does not get better in all people.  Note: Each of the side effects above was reported in 40% or greater of patients treated with oxaliplatin. Not all possible side effects are included above.  Warnings and Precautions . Allergic reactions, including anaphylaxis, which may be life-threatening. Signs of allergic reaction to this drug may be swelling of the face, feeling like your tongue or throat are swelling, trouble breathing, rash, itching, fever, chills, feeling dizzy, and/or feeling that your heart is beating in a fast or not normal way. If this happens, do not take another dose of this drug. You should get urgent medical treatment.  . Thickening and/or inflammation (swelling) of the lung tissues, which may be  life-threatening. You may have a dry cough or trouble breathing.  . This medicine can make you more sensitive to cold, which may cause or worsen nerve problems.  . Changes in your central nervous system can happen. The central nervous system is made up of your brain and spinal cord. You could feel extreme tiredness, agitation, confusion, have hallucinations (see or hear things that are not there), trouble understanding or speaking, loss of control of your bowels or bladder, eyesight changes, numbness or lack of strength to your arms, legs, face, or body, seizures or coma. If you start to have any of these symptoms let your doctor know right away.  . Severe decrease in white blood cells and platelets when combined with fluorouracil and leucovorin. This may be life-threatening.  . Severe changes in your liver function  . Abnormal heartbeat and/or EKG, which can be life-threatening  . Rhabdomyolysis- damage to your muscles which may release proteins in your blood and affect how your kidneys work, which may be life-threatening. You may have severe muscle weakness and/or pain, or dark urine.  . Abnormal bleeding - symptoms may be coughing up blood, throwing up blood (may look like coffee grounds), red or black tarry bowel movements, abnormally heavy menstrual flow, nosebleeds, or any other unusual bleeding.  Note: Some of the side effects above are very rare. If you have concerns and/or questions, please discuss them with your medical team.  Important Information . This drug may impair your ability to drive or use machinery. Talk to your doctor and/or nurse about precautions you may need to take. . This drug may be present in the saliva, tears, sweat, urine, stool, vomit, semen, and vaginal secretions. Talk to your doctor and/or your nurse about the necessary precautions to take during this time.  Treating Side Effects . Manage tiredness  by pacing your activities for the day.  . Be sure to  include periods of rest between energy-draining activities.  . To decrease the risk of infection, wash your hands regularly.  . Avoid close contact with people who have a cold, the flu, or other infections.  . Take your temperature as your doctor or nurse tells you, and whenever you feel like you may have a fever.  . To help decrease the risk of bleeding, use a soft toothbrush. Check with your nurse before using dental floss.  . Be very careful when using knives or tools.  . Use an electric shaver instead of a razor.  . Drink plenty of fluids (a minimum of eight glasses per day is recommended).  . Mouth care is very important. Your mouth care should consist of routine, gentle cleaning of your teeth or dentures and rinsing your mouth with a mixture of 1/2 teaspoon of salt in 8 ounces of water or 1/2 teaspoon of baking soda in 8 ounces of water. This should be done at least after each meal and at bedtime.  . If you have mouth sores, avoid mouthwash that has alcohol. Also avoid alcohol and smoking because they can bother your mouth and throat.  . To help with nausea and vomiting, eat small, frequent meals instead of three large meals a day. Choose foods and drinks that are at room temperature. Ask your nurse or doctor about other helpful tips and medicine that is available to help stop or lessen these symptoms.  . If you throw up or have loose bowel movements, you should drink more fluids so that you do not become dehydrated (lack of water in the body from losing too much fluid).  . If you have diarrhea, eat low-fiber foods that are high in protein and calories and avoid foods that can irritate your digestive tracts or lead to cramping.  . Ask your nurse or doctor about medicine that can lessen or stop your diarrhea.  . If you have numbness and tingling in your hands and feet, be careful when cooking, walking, and handling sharp objects and hot liquids.  . Do not drink cold drinks or use  ice. Cover exposed skin before coming in contact with cold temperatures or cold objects. When out in cold weather wear warm clothing and cover your mouth and nose to warm the air that goes into your lungs. Tell your doctor if you get sensitive to the cold.  Food and Drug Interactions . There are no known interactions of oxaliplatin with food.  . Check with your doctor or pharmacist about all other prescription medicines and over-the-counter medicines and dietary supplements (vitamins, minerals, herbs, and others) you are taking before starting this medicine as there are known drug interactions with oxaliplatin. Also, check with your doctor or pharmacist before starting any new prescription or over-the-counter medicines, or dietary supplements to make sure that there are no interactions.  . There are known interactions of oxaliplatin when given in combination with fluorouracil and leucovorin and with blood thinning medicine such as warfarin. Ask your doctor what precautions you should take.  When to Call the Doctor  Call your doctor or nurse if you have any of these symptoms and/or any new or unusual symptoms: . Fever of 100.4 F (38 C) or higher  . Chills  . Tiredness that interferes with your daily activities  . Feeling dizzy or lightheaded  . Easy bleeding or bruising  . Extreme tiredness, agitation, or confusion  .  Symptoms of a seizure such as confusion, blacking out, passing out, loss of hearing or vision, blurred vision, unusual smells or tastes (such as burning rubber), trouble talking, tremors or shaking in parts or all of the body, repeated body movements, tense muscles that do not relax, and loss of control of urine and bowels. If you or your family member suspects you are having a seizure, call 911 right away.  . Hallucinations  . Trouble understanding or speaking  . Loss of control of bowels or bladder  . Blurry vision or changes in your eyesight such as pain in your  eyes  . Numbness or lack of strength to your arms, legs, face, or body  . Feeling that your heart is beating in a fast or not normal way (palpitations)  . Pain or heavy feeling in your chest  . Dry cough or coughing up blood  . Trouble breathing  . Difficulty swallowing or an abnormal feeling of your tongue  . Pain in your mouth or throat that makes it hard to eat or drink  . Nausea that stops you from eating or drinking and/or is not relieved by prescribed medicines  . Throwing up  . Diarrhea, 4 times in one day or diarrhea with lack of strength or a feeling of being dizzy  . Numbness, tingling, or pain in your hands and feet  . Signs of possible liver problems: dark urine, pale bowel movements, bad stomach pain, feeling very tired and weak, unusual itching, or yellowing of the eyes or skin  . Blood in your urine, vomit (bright red or coffee-ground) and/or stools (bright red, or black/tarry)  . Signs of rhabdomyolysis: decreased urine, very dark urine, muscle pain in the shoulders, thighs, or lower back; muscle weakness or trouble moving arms and legs  . Signs of allergic reaction: swelling of the face, feeling like your tongue or throat are swelling, trouble breathing, rash, itching, fever, chills, feeling dizzy, and/or feeling that your heart is beating in a fast or not normal way. If this happens, call 911 for emergency care.  . If you think you may be pregnant or may have impregnated your partner  Reproduction Warnings . Pregnancy warning: This drug may have harmful effects on the unborn baby. Women of childbearing potential should use effective methods of birth control during your cancer treatment and for 9 months after your treatment. Men with female partners of childbearing potential should use effective methods of birth control during your cancer treatment and for 6 months after your cancer treatment. Let your doctor know right away if you think you may be pregnant or  may have impregnated your partner.  . Breastfeeding warning: It is not known if this drug passes into breast milk. For this reason, women should not breastfeed during treatment and for 3 months after treatment because this drug could enter the breast milk and cause harm to a breastfeeding baby..  . Fertility warning: In men and women both, this drug may affect your ability to have children in the future. Talk with your doctor or nurse if you plan to have children. Ask for information on sperm or egg banking.   SELF CARE ACTIVITIES WHILE RECEIVING CHEMOTHERAPY:  Hydration Increase your fluid intake 48 hours prior to treatment and drink at least 8 to 12 cups (64 ounces) of water/decaffeinated beverages per day after treatment. You can still have your cup of coffee or soda but these beverages do not count as part of your 8 to 12  cups that you need to drink daily. No alcohol intake.  Medications Continue taking your normal prescription medication as prescribed.  If you start any new herbal or new supplements please let us know first to make sure it is safe.  Mouth Care Have teeth cleaned professionally before starting treatment. Keep dentures and partial plates clean. Use soft toothbrush and do not use mouthwashes that contain alcohol. Biotene is a good mouthwash that is available at most pharmacies or may be ordered by calling [phone]. Use warm salt water gargles (1 teaspoon salt per 1 quart warm water) before and after meals and at bedtime. If you need dental work, please let the doctor know before you go for your appointment so that we can coordinate the best possible time for you in regards to your chemo regimen. You need to also let your dentist know that you are actively taking chemo. We may need to do labs prior to your dental appointment.  Skin Care Always use sunscreen that has not expired and with SPF (Sun Protection Factor) of 50 or higher. Wear hats to protect your head from the  sun. Remember to use sunscreen on your hands, ears, face, & feet.  Use good moisturizing lotions such as udder cream, eucerin, or even Vaseline. Some chemotherapies can cause dry skin, color changes in your skin and nails.    . Avoid long, hot showers or baths. . Use gentle, fragrance-free soaps and laundry detergent. . Use moisturizers, preferably creams or ointments rather than lotions because the thicker consistency is better at preventing skin dehydration. Apply the cream or ointment within 15 minutes of showering. Reapply moisturizer at night, and moisturize your hands every time after you wash them.  Hair Loss (if your doctor says your hair will fall out)  . If your doctor says that your hair is likely to fall out, decide before you begin chemo whether you want to wear a wig. You may want to shop before treatment to match your hair color. . Hats, turbans, and scarves can also camouflage hair loss, although some people prefer to leave their heads uncovered. If you go bare-headed outdoors, be sure to use sunscreen on your scalp. . Cut your hair short. It eases the inconvenience of shedding lots of hair, but it also can reduce the emotional impact of watching your hair fall out. . Don't perm or color your hair during chemotherapy. Those chemical treatments are already damaging to hair and can enhance hair loss. Once your chemo treatments are done and your hair has grown back, it's OK to resume dyeing or perming hair.  With chemotherapy, hair loss is almost always temporary. But when it grows back, it may be a different color or texture. In older adults who still had hair color before chemotherapy, the new growth may be completely gray.  Often, new hair is very fine and soft.  Infection Prevention Please wash your hands for at least 30 seconds using warm soapy water. Handwashing is the #1 way to prevent the spread of germs. Stay away from sick people or people who are getting over a cold. If you  develop respiratory systems such as green/yellow mucus production or productive cough or persistent cough let us know and we will see if you need an antibiotic. It is a good idea to keep a pair of gloves on when going into grocery stores/[employer] to decrease your risk of coming into contact with germs on the carts, etc. Carry alcohol hand gel with you  at all times and use it frequently if out in public. If your temperature reaches 100.5 or higher please call the clinic and let us know.  If it is after hours or on the weekend please go to the ER if your temperature is over 100.5.  Please have your own personal thermometer at home to use.    Sex and bodily fluids If you are going to have sex, a condom must be used to protect the person that isn't taking chemotherapy. Chemo can decrease your libido (sex drive). For a few days after chemotherapy, chemotherapy can be excreted through your bodily fluids.  When using the toilet please close the lid and flush the toilet twice.  Do this for a few day after you have had chemotherapy.   Effects of chemotherapy on your sex life Some changes are simple and won't last long. They won't affect your sex life permanently.  Sometimes you may feel: . too tired . not strong enough to be very active . sick or sore  . not in the mood . anxious or low Your anxiety might not seem related to sex. For example, you may be worried about the cancer and how your treatment is going. Or you may be worried about money, or about how you family are coping with your illness.  These things can cause stress, which can affect your interest in sex. It's important to talk to your partner about how you feel.  Remember - the changes to your sex life don't usually last long. There's usually no medical reason to stop having sex during chemo. The drugs won't have any long term physical effects on your performance or enjoyment of sex. Cancer can't be passed on to your partner during  sex  Contraception It's important to use reliable contraception during treatment. Avoid getting pregnant while you or your partner are having chemotherapy. This is because the drugs may harm the baby. Sometimes chemotherapy drugs can leave a man or woman infertile.  This means you would not be able to have children in the future. You might want to talk to someone about permanent infertility. It can be very difficult to learn that you may no longer be able to have children. Some people find counselling helpful. There might be ways to preserve your fertility, although this is easier for men than for women. You may want to speak to a fertility expert. You can talk about sperm banking or harvesting your eggs. You can also ask about other fertility options, such as donor eggs. If you have or have had breast cancer, your doctor might advise you not to take the contraceptive pill. This is because the hormones in it might affect the cancer. It is not known for sure whether or not chemotherapy drugs can be passed on through semen or secretions from the vagina. Because of this some doctors advise people to use a barrier method if you have sex during treatment. This applies to vaginal, anal or oral sex. Generally, doctors advise a barrier method only for the time you are actually having the treatment and for about a week after your treatment. Advice like this can be worrying, but this does not mean that you have to avoid being intimate with your partner. You can still have close contact with your partner and continue to enjoy sex.  Animals If you have cats or birds we just ask that you not change the litter or change the cage.  Please have someone else do this  for you while you are on chemotherapy.   Food Safety During and After Cancer Treatment Food safety is important for people both during and after cancer treatment. Cancer and cancer treatments, such as chemotherapy, radiation therapy, and stem cell/bone marrow  transplantation, often weaken the immune system. This makes it harder for your body to protect itself from foodborne illness, also called food poisoning. Foodborne illness is caused by eating food that contains harmful bacteria, parasites, or viruses.  Foods to avoid Some foods have a higher risk of becoming tainted with bacteria. These include: Marland Kitchen Unwashed fresh fruit and vegetables, especially leafy vegetables that can hide dirt and other contaminants . Raw sprouts, such as alfalfa sprouts . Raw or undercooked beef, especially ground beef, or other raw or undercooked meat and poultry . Fatty, fried, or spicy foods immediately before or after treatment.  These can sit heavy on your stomach and make you feel nauseous. . Raw or undercooked shellfish, such as oysters. . Sushi and sashimi, which often contain raw fish.  . Unpasteurized beverages, such as unpasteurized fruit juices, raw milk, raw yogurt, or cider . Undercooked eggs, such as soft boiled, over easy, and poached; raw, unpasteurized eggs; or foods made with raw egg, such as homemade raw cookie dough and homemade mayonnaise  Simple steps for food safety  Shop smart. . Do not buy food stored or displayed in an unclean area. . Do not buy bruised or damaged fruits or vegetables. . Do not buy cans that have cracks, dents, or bulges. . Pick up foods that can spoil at the end of your shopping trip and store them in a cooler on the way home.  Prepare and clean up foods carefully. . Rinse all fresh fruits and vegetables under running water, and dry them with a clean towel or paper towel. . Clean the top of cans before opening them. . After preparing food, wash your hands for 20 seconds with hot water and soap. Pay special attention to areas between fingers and under nails. . Clean your utensils and dishes with hot water and soap. Marland Kitchen Disinfect your kitchen and cutting boards using 1 teaspoon of liquid, unscented bleach mixed into 1 quart of  water.    Dispose of old food. . Eat canned and packaged food before its expiration date (the "use by" or "best before" date). . Consume refrigerated leftovers within 3 to 4 days. After that time, throw out the food. Even if the food does not smell or look spoiled, it still may be unsafe. Some bacteria, such as Listeria, can grow even on foods stored in the refrigerator if they are kept for too long.  Take precautions when eating out. . At restaurants, avoid buffets and salad bars where food sits out for a long time and comes in contact with many people. Food can become contaminated when someone with a virus, often a norovirus, or another "bug" handles it. . Put any leftover food in a "to-go" container yourself, rather than having the server do it. And, refrigerate leftovers as soon as you get home. . Choose restaurants that are clean and that are willing to prepare your food as you order it cooked.   AT HOME MEDICATIONS:  Compazine/Prochlorperazine 10mg  tablet. Take 1 tablet every 6 hours as needed for nausea/vomiting. (This can make you sleepy)   EMLA cream. Apply a quarter size amount to port site 1 hour prior to chemo. Do not rub in. Cover with plastic wrap.    Diarrhea Sheet   If you are having loose stools/diarrhea, please purchase Imodium and begin taking as outlined:  At the first sign of poorly formed or loose stools you should begin taking Imodium (loperamide) 2 mg capsules.  Take two tablets (4mg ) followed by one tablet (2mg ) every 2 hours - DO NOT EXCEED 8 tablets in 24 hours.  If it is bedtime and you are having loose stools, take 2 tablets at bedtime, then 2 tablets every 4 hours until morning.   Always call the [facility] if you are having loose stools/diarrhea that you can't get under control.  Loose stools/diarrhea  leads to dehydration (loss of water) in your body.  We have other options of trying to get the loose stools/diarrhea to stop but you must let us know!   Constipation Sheet  Colace - 100 mg capsules - take 2 capsules daily.  If this doesn't help then you can increase to 2 capsules twice daily.  Please call if the above does not work for you. Do not go more than 2 days without a bowel movement.  It is very important that you do not become constipated.  It will make you feel sick to your stomach (nausea) and can cause abdominal pain and vomiting.  Nausea Sheet   Compazine/Prochlorperazine 10mg  tablet. Take 1 tablet every 6 hours as needed for nausea/vomiting (This can make you drowsy).  If you are having persistent nausea (nausea that does not stop) please call the [facility] and let us know the amount of nausea that you are experiencing.  If you begin to vomit, you need to call the [facility] and if it is the weekend and you have vomited more than one time and can't get it to stop-go to the Emergency Room.  Persistent nausea/vomiting can lead to dehydration (loss of fluid in your body) and will make you feel very weak and unwell. Ice chips, sips of clear liquids, foods that are at room temperature, crackers, and toast tend to be better tolerated.   SYMPTOMS TO REPORT AS SOON AS POSSIBLE AFTER TREATMENT:  FEVER GREATER THAN 100.5 F  CHILLS WITH OR WITHOUT FEVER  NAUSEA AND VOMITING THAT IS NOT CONTROLLED WITH YOUR NAUSEA MEDICATION  UNUSUAL SHORTNESS OF BREATH  UNUSUAL BRUISING OR BLEEDING  TENDERNESS IN MOUTH AND THROAT WITH OR WITHOUT   PRESENCE OF ULCERS  URINARY PROBLEMS  BOWEL PROBLEMS  UNUSUAL RASH      Wear comfortable clothing and clothing appropriate for easy access to any Portacath or PICC line. Let us know if there is anything that we can do to make your therapy better!    What to do if you need assistance after hours or on the weekends: CALL [phone].   HOLD on the line, do not hang up.  You will hear multiple messages but at the end you will be connected with a nurse triage line.  They will contact the doctor if necessary.  Most of the time they will be able to assist you.  Do not call the hospital operator.      I have been informed and understand all of the instructions given to me and have received a copy. I have been instructed to call  the clinic [phone] or my family physician as soon as possible for continued medical care, if indicated. I do not have any more questions at this time but understand that I may call the [facility] or the Patient Navigator at [phone] during office hours should I have questions or need assistance in obtaining follow-up care.

## 2019-07-17 ENCOUNTER — Inpatient Hospital Stay (HOSPITAL_COMMUNITY): Payer: Medicare HMO

## 2019-07-17 ENCOUNTER — Other Ambulatory Visit: Payer: Self-pay

## 2019-07-17 DIAGNOSIS — C182 Malignant neoplasm of ascending colon: Secondary | ICD-10-CM

## 2019-07-17 DIAGNOSIS — Z95828 Presence of other vascular implants and grafts: Secondary | ICD-10-CM

## 2019-07-17 NOTE — Progress Notes (Signed)

## 2019-07-19 ENCOUNTER — Inpatient Hospital Stay (HOSPITAL_COMMUNITY): Payer: Medicare HMO

## 2019-07-19 ENCOUNTER — Other Ambulatory Visit: Payer: Self-pay

## 2019-07-19 ENCOUNTER — Emergency Department (HOSPITAL_COMMUNITY): Payer: Medicare HMO

## 2019-07-19 ENCOUNTER — Inpatient Hospital Stay (HOSPITAL_BASED_OUTPATIENT_CLINIC_OR_DEPARTMENT_OTHER): Payer: Medicare HMO | Admitting: Hematology

## 2019-07-19 ENCOUNTER — Emergency Department (HOSPITAL_COMMUNITY)
Admission: EM | Admit: 2019-07-19 | Discharge: 2019-07-19 | Disposition: A | Discharge status: Home / Self Care | Payer: Medicare HMO | Attending: Emergency Medicine | Admitting: Emergency Medicine

## 2019-07-19 ENCOUNTER — Encounter (HOSPITAL_COMMUNITY): Payer: Self-pay

## 2019-07-19 ENCOUNTER — Encounter (HOSPITAL_COMMUNITY): Payer: Self-pay | Admitting: Hematology

## 2019-07-19 VITALS — BP 140/60 | HR 86 | Temp 96.7°F | Resp 18 | Wt 135.8 lb

## 2019-07-19 VITALS — BP 131/60 | HR 72 | Temp 96.9°F | Resp 18

## 2019-07-19 DIAGNOSIS — C182 Malignant neoplasm of ascending colon: Secondary | ICD-10-CM

## 2019-07-19 DIAGNOSIS — R9431 Abnormal electrocardiogram [ECG] [EKG]: Secondary | ICD-10-CM | POA: Diagnosis not present

## 2019-07-19 DIAGNOSIS — T7840XA Allergy, unspecified, initial encounter: Secondary | ICD-10-CM | POA: Diagnosis not present

## 2019-07-19 DIAGNOSIS — Z85038 Personal history of other malignant neoplasm of large intestine: Secondary | ICD-10-CM | POA: Diagnosis not present

## 2019-07-19 DIAGNOSIS — Z7982 Long term (current) use of aspirin: Secondary | ICD-10-CM | POA: Insufficient documentation

## 2019-07-19 DIAGNOSIS — Z9049 Acquired absence of other specified parts of digestive tract: Secondary | ICD-10-CM | POA: Insufficient documentation

## 2019-07-19 DIAGNOSIS — R0602 Shortness of breath: Secondary | ICD-10-CM | POA: Diagnosis not present

## 2019-07-19 DIAGNOSIS — R0600 Dyspnea, unspecified: Secondary | ICD-10-CM | POA: Diagnosis present

## 2019-07-19 DIAGNOSIS — E039 Hypothyroidism, unspecified: Secondary | ICD-10-CM | POA: Diagnosis not present

## 2019-07-19 DIAGNOSIS — Z79899 Other long term (current) drug therapy: Secondary | ICD-10-CM | POA: Insufficient documentation

## 2019-07-19 DIAGNOSIS — Z95828 Presence of other vascular implants and grafts: Secondary | ICD-10-CM

## 2019-07-19 LAB — CBC WITH DIFFERENTIAL/PLATELET
Abs Immature Granulocytes: 0.01 10*3/uL (ref 0.00–0.07)
Basophils Absolute: 0 10*3/uL (ref 0.0–0.1)
Basophils Relative: 1 %
Eosinophils Absolute: 0.4 10*3/uL (ref 0.0–0.5)
Eosinophils Relative: 8 %
HCT: 31.1 % — ABNORMAL LOW (ref 36.0–46.0)
Hemoglobin: 10.7 g/dL — ABNORMAL LOW (ref 12.0–15.0)
Immature Granulocytes: 0 %
Lymphocytes Relative: 27 %
Lymphs Abs: 1.3 10*3/uL (ref 0.7–4.0)
MCH: 30.7 pg (ref 26.0–34.0)
MCHC: 34.4 g/dL (ref 30.0–36.0)
MCV: 89.1 fL (ref 80.0–100.0)
Monocytes Absolute: 0.5 10*3/uL (ref 0.1–1.0)
Monocytes Relative: 11 %
Neutro Abs: 2.4 10*3/uL (ref 1.7–7.7)
Neutrophils Relative %: 53 %
Platelets: 158 10*3/uL (ref 150–400)
RBC: 3.49 MIL/uL — ABNORMAL LOW (ref 3.87–5.11)
RDW: 12.4 % (ref 11.5–15.5)
WBC: 4.6 10*3/uL (ref 4.0–10.5)
nRBC: 0 % (ref 0.0–0.2)

## 2019-07-19 LAB — COMPREHENSIVE METABOLIC PANEL
ALT: 18 U/L (ref 0–44)
AST: 27 U/L (ref 15–41)
Albumin: 4 g/dL (ref 3.5–5.0)
Alkaline Phosphatase: 46 U/L (ref 38–126)
Anion gap: 9 (ref 5–15)
BUN: 10 mg/dL (ref 8–23)
CO2: 26 mmol/L (ref 22–32)
Calcium: 9.5 mg/dL (ref 8.9–10.3)
Chloride: 105 mmol/L (ref 98–111)
Creatinine, Ser: 0.94 mg/dL (ref 0.44–1.00)
GFR calc Af Amer: >60 mL/min (ref >60)
GFR calc non Af Amer: >60 mL/min (ref >60)
Glucose, Bld: 82 mg/dL (ref 70–99)
Potassium: 4 mmol/L (ref 3.5–5.1)
Sodium: 140 mmol/L (ref 135–145)
Total Bilirubin: 1.2 mg/dL (ref 0.3–1.2)
Total Protein: 6.8 g/dL (ref 6.5–8.1)

## 2019-07-19 MED ORDER — SODIUM CHLORIDE 0.9% FLUSH
10.0000 mL | INTRAVENOUS | Status: DC | PRN
  Administered 2019-07-19 (×3): 10 mL

## 2019-07-19 MED ORDER — PALONOSETRON HCL INJECTION 0.25 MG/5ML
0.2500 mg | Freq: Once | INTRAVENOUS | Status: AC
  Administered 2019-07-19: 0.25 mg via INTRAVENOUS
  Filled 2019-07-19: qty 5

## 2019-07-19 MED ORDER — DEXTROSE 5 % IV SOLN: Freq: Once | INTRAVENOUS | Status: AC

## 2019-07-19 MED ORDER — OXALIPLATIN CHEMO INJECTION 100 MG/20ML
121.0000 mg/m2 | Freq: Once | INTRAVENOUS | Status: AC
  Administered 2019-07-19: 200 mg via INTRAVENOUS
  Filled 2019-07-19: qty 40

## 2019-07-19 MED ORDER — HEPARIN SOD (PORK) LOCK FLUSH 100 UNIT/ML IV SOLN
500.0000 [IU] | Freq: Once | INTRAVENOUS | Status: AC | PRN
  Administered 2019-07-19: 500 [IU]

## 2019-07-19 MED ORDER — SODIUM CHLORIDE 0.9 % IV SOLN
10.0000 mg | Freq: Once | INTRAVENOUS | Status: AC
  Administered 2019-07-19: 10 mg via INTRAVENOUS
  Filled 2019-07-19: qty 10

## 2019-07-19 NOTE — Progress Notes (Signed)
.   Pharmacist Chemotherapy Monitoring - Initial Assessment    Anticipated start date: 07/19/19  Regimen:  . Are orders appropriate based on the patient's diagnosis, regimen, and cycle? Yes . Does the plan date match the patient's scheduled date? Yes . Is the sequencing of drugs appropriate? Yes . Are the premedications appropriate for the patient's regimen? Yes . Prior Authorization for treatment is: Approved o If applicable, is the correct biosimilar selected based on the patient's insurance? not applicable  Organ Function and Labs: Marland Kitchen Are dose adjustments needed based on the patient's renal function, hepatic function, or hematologic function? No . Are appropriate labs ordered prior to the start of patient's treatment? Yes . Other organ system assessment, if indicated: N/A . The following baseline labs, if indicated, have been ordered: NA  Dose Assessment: . Are the drug doses appropriate? Yes . Are the following correct: o Drug concentrations Yes o IV fluid compatible with drug Yes o Administration routes Yes o Timing of therapy Yes . If applicable, does the patient have documented access for treatment and/or plans for port-a-cath placement? yes . If applicable, have lifetime cumulative doses been properly documented and assessed? not applicable Lifetime Dose Tracking  No doses have been documented on this patient for the following tracked chemicals: Doxorubicin, Epirubicin, Idarubicin, Daunorubicin, Mitoxantrone, Bleomycin, Oxaliplatin, Carboplatin, Liposomal Doxorubicin  o   Toxicity Monitoring/Prevention: . The patient has the following take home antiemetics prescribed: Prochlorperazine . The patient has the following take home medications prescribed: N/A . Medication allergies and previous infusion related reactions, if applicable, have been reviewed and addressed. Yes . The patient's current medication list has been assessed for drug-drug interactions with their chemotherapy  regimen. no significant drug-drug interactions were identified on review.  Order Review: . Are the treatment plan orders signed? No . Is the patient scheduled to see a provider prior to their treatment? Yes  I verify that I have reviewed each item in the above checklist and answered each question accordingly.  Wynona Neat 07/19/2019 8:09 AM

## 2019-07-19 NOTE — Progress Notes (Signed)
Patient has been assessed, vital signs and labs have been reviewed by Dr. Katragadda. ANC, Creatinine, LFTs, and Platelets are within treatment parameters per Dr. Katragadda. The patient is good to proceed with treatment at this time.  
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
## 2019-07-19 NOTE — Progress Notes (Signed)
Labs reviewed with MD today , proceed with day one of treatment today per MD  Treatment given per orders. Patient tolerated it well without problems. Vitals stable and discharged home from clinic ambulatory. Follow up as scheduled.

## 2019-07-19 NOTE — Assessment & Plan Note (Addendum)
1.  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 -She will start her first cycle of CapeOx regimen today.  We will plan for 3 months of this regimen. -She will start taking capecitabine 1500 mg twice daily, 2 weeks on 1 week off. -She will receive oxaliplatin 130 mg on day 1 every 3 weeks. -We discussed various side effects of the regimen including but not limited to hand-foot skin reaction, cold sensitivity, neuropathy, cytopenias, allergic reactions among others. -We have reviewed her CBC and LFTs.  She will proceed with her first cycle today.  I will reevaluate her in 1 week.  2.  Normocytic anemia: -Hemoglobin today improved to 1.7 with MCV of 89. -Ferritin, Y86 folic acid were normal.  3.  Family history: -Father and sister had brain tumors.  Another sister died of breast cancer in her 15s.  2 sisters had malignant melanoma.  Niece died of breast cancer. -I have recommended genetic testing.
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
## 2019-07-19 NOTE — ED Triage Notes (Signed)
Pt had her first chemo treatment today. Once she left, she had some SOB and states her throat and right arm felt funny. She was SOB, but that has since resolved. Her right arm is still feeling different, but she denies any numbness. Pt alert and oriented. Ambulatory with NAD

## 2019-07-19 NOTE — Patient Instructions (Signed)
[facility] Discharge Instructions for Patients Receiving Chemotherapy  Today you received the following chemotherapy agents   To help prevent nausea and vomiting after your treatment, we encourage you to take your nausea medication   If you develop nausea and vomiting that is not controlled by your nausea medication, call the clinic.   BELOW ARE SYMPTOMS THAT SHOULD BE REPORTED IMMEDIATELY:  *FEVER GREATER THAN 100.5 F  *CHILLS WITH OR WITHOUT FEVER  NAUSEA AND VOMITING THAT IS NOT CONTROLLED WITH YOUR NAUSEA MEDICATION  *UNUSUAL SHORTNESS OF BREATH  *UNUSUAL BRUISING OR BLEEDING  TENDERNESS IN MOUTH AND THROAT WITH OR WITHOUT PRESENCE OF ULCERS  *URINARY PROBLEMS  *BOWEL PROBLEMS  UNUSUAL RASH Items with * indicate a potential emergency and should be followed up as soon as possible.  Feel free to call the clinic should you have any questions or concerns. The clinic phone number is [phone].  Please show the CHEMO ALERT CARD at check-in to the Emergency Department and triage nurse.   

## 2019-07-19 NOTE — Patient Instructions (Addendum)
[place] at [facility] Discharge Instructions  You were seen today by Dr. Delton Coombes. He went over your recent lab results. He went over your treatment and the possible side effects you may experience. He will see you back in 1 week for labs and follow up.   Thank you for choosing [place] at [facility] to provide your oncology and hematology care.  To afford each patient quality time with our provider, please arrive at least 15 minutes before your scheduled appointment time.   If you have a lab appointment with the [place] please come in thru the  Main Entrance and check in at the main information desk  You need to re-schedule your appointment should you arrive 10 or more minutes late.  We strive to give you quality time with our providers, and arriving late affects you and other patients whose appointments are after yours.  Also, if you no show three or more times for appointments you may be dismissed from the clinic at the providers discretion.     Again, thank you for choosing [facility].  Our hope is that these requests will decrease the amount of time that you wait before being seen by our physicians.       _____________________________________________________________  Should you have questions after your visit to [facility], please contact our office at [phone] between the hours of 8:00 a.m. and 4:30 p.m.  Voicemails left after 4:00 p.m. will not be returned until the following business day.  For prescription refill requests, have your pharmacy contact our office and allow 72 hours.    Cancer Center Support Programs:   > Cancer Support Group  2nd Tuesday of the month 1pm-2pm, Journey Room

## 2019-07-19 NOTE — Discharge Instructions (Addendum)
Return if any problems. Let your Physician know about the symptoms you have had today

## 2019-07-20 ENCOUNTER — Telehealth (HOSPITAL_COMMUNITY): Payer: Self-pay

## 2019-07-20 NOTE — Telephone Encounter (Signed)
24 hour follow up- spoke with patient today. She stated she had a rough afternoon after treatment. See ED note .  Patient states her symptoms with her arm have resolved. She states when she eats her throat feels normal, when she takes a pill or drinks room temperature liquids it hurts the right side of her throat "bad". She denies any SOB, no nausea or vomiting. Made sure she knew about the phone numbers to call for after hours if she needed anything or had any other concerns. Will call her again on Monday to assess her further.

## 2019-07-23 ENCOUNTER — Telehealth (HOSPITAL_COMMUNITY): Payer: Self-pay

## 2019-07-23 NOTE — Telephone Encounter (Signed)
1300-called patient to check on her since her episode last week with the oxaliplatin. Patient stated she is not able to eat or drink much, she vomited once this weekend, she is taking compazine every 6 hours. She develolped diarrhea yesterday, started taking imodium, took 2 this morning and no diarrhea since then. Patient states she has some anxiety, that she is worried her regimen is too strong/harsh for her. States she is having some pain under her right breast area. Will notify MD and call her back.  1345-Told patient to stop her xeloda for tonight till she sees someone in the clinic tomorrow per Dr. Delton Coombes. Told her to come tomorrow for labs and to see the NP to further evaluate her symptoms. Told her appointment time and she stated that she understood. Encouraged her to eat and drink whatever she could get down. To continue to take her nausea medication around the clock.

## 2019-07-24 ENCOUNTER — Inpatient Hospital Stay (HOSPITAL_COMMUNITY): Payer: Medicare HMO

## 2019-07-24 ENCOUNTER — Inpatient Hospital Stay (HOSPITAL_COMMUNITY): Payer: Medicare HMO | Admitting: Nurse Practitioner

## 2019-07-24 ENCOUNTER — Other Ambulatory Visit: Payer: Self-pay

## 2019-07-24 DIAGNOSIS — E039 Hypothyroidism, unspecified: Secondary | ICD-10-CM | POA: Diagnosis not present

## 2019-07-24 DIAGNOSIS — C182 Malignant neoplasm of ascending colon: Secondary | ICD-10-CM

## 2019-07-24 DIAGNOSIS — F419 Anxiety disorder, unspecified: Secondary | ICD-10-CM

## 2019-07-24 DIAGNOSIS — E7800 Pure hypercholesterolemia, unspecified: Secondary | ICD-10-CM | POA: Diagnosis not present

## 2019-07-24 DIAGNOSIS — R112 Nausea with vomiting, unspecified: Secondary | ICD-10-CM | POA: Diagnosis not present

## 2019-07-24 DIAGNOSIS — C183 Malignant neoplasm of hepatic flexure: Secondary | ICD-10-CM | POA: Diagnosis not present

## 2019-07-24 DIAGNOSIS — R5383 Other fatigue: Secondary | ICD-10-CM | POA: Diagnosis not present

## 2019-07-24 DIAGNOSIS — D649 Anemia, unspecified: Secondary | ICD-10-CM | POA: Diagnosis not present

## 2019-07-24 DIAGNOSIS — H409 Unspecified glaucoma: Secondary | ICD-10-CM | POA: Diagnosis not present

## 2019-07-24 DIAGNOSIS — E860 Dehydration: Secondary | ICD-10-CM | POA: Insufficient documentation

## 2019-07-24 DIAGNOSIS — R197 Diarrhea, unspecified: Secondary | ICD-10-CM | POA: Diagnosis not present

## 2019-07-24 DIAGNOSIS — R110 Nausea: Secondary | ICD-10-CM

## 2019-07-24 LAB — CBC WITH DIFFERENTIAL/PLATELET
Abs Immature Granulocytes: 0.01 10*3/uL (ref 0.00–0.07)
Basophils Absolute: 0 10*3/uL (ref 0.0–0.1)
Basophils Relative: 1 %
Eosinophils Absolute: 0.3 10*3/uL (ref 0.0–0.5)
Eosinophils Relative: 7 %
HCT: 34.4 % — ABNORMAL LOW (ref 36.0–46.0)
Hemoglobin: 11.8 g/dL — ABNORMAL LOW (ref 12.0–15.0)
Immature Granulocytes: 0 %
Lymphocytes Relative: 22 %
Lymphs Abs: 1.1 10*3/uL (ref 0.7–4.0)
MCH: 30.3 pg (ref 26.0–34.0)
MCHC: 34.3 g/dL (ref 30.0–36.0)
MCV: 88.4 fL (ref 80.0–100.0)
Monocytes Absolute: 0.4 10*3/uL (ref 0.1–1.0)
Monocytes Relative: 8 %
Neutro Abs: 3 10*3/uL (ref 1.7–7.7)
Neutrophils Relative %: 62 %
Platelets: 170 10*3/uL (ref 150–400)
RBC: 3.89 MIL/uL (ref 3.87–5.11)
RDW: 12 % (ref 11.5–15.5)
WBC: 4.8 10*3/uL (ref 4.0–10.5)
nRBC: 0 % (ref 0.0–0.2)

## 2019-07-24 LAB — COMPREHENSIVE METABOLIC PANEL
ALT: 227 U/L — ABNORMAL HIGH (ref 0–44)
AST: 401 U/L — ABNORMAL HIGH (ref 15–41)
Albumin: 4.2 g/dL (ref 3.5–5.0)
Alkaline Phosphatase: 74 U/L (ref 38–126)
Anion gap: 8 (ref 5–15)
BUN: 14 mg/dL (ref 8–23)
CO2: 26 mmol/L (ref 22–32)
Calcium: 9.2 mg/dL (ref 8.9–10.3)
Chloride: 104 mmol/L (ref 98–111)
Creatinine, Ser: 0.96 mg/dL (ref 0.44–1.00)
GFR calc Af Amer: >60 mL/min (ref >60)
GFR calc non Af Amer: 59 mL/min — ABNORMAL LOW (ref >60)
Glucose, Bld: 117 mg/dL — ABNORMAL HIGH (ref 70–99)
Potassium: 4.3 mmol/L (ref 3.5–5.1)
Sodium: 138 mmol/L (ref 135–145)
Total Bilirubin: 2.1 mg/dL — ABNORMAL HIGH (ref 0.3–1.2)
Total Protein: 7 g/dL (ref 6.5–8.1)

## 2019-07-24 LAB — MAGNESIUM: Magnesium: 1.7 mg/dL (ref 1.7–2.4)

## 2019-07-24 MED ORDER — ALPRAZOLAM 0.25 MG PO TABS: 0.2500 mg | ORAL_TABLET | Freq: Two times a day (BID) | ORAL | 0 refills | Status: DC | PRN

## 2019-07-24 MED ORDER — SODIUM CHLORIDE 0.9% FLUSH
10.0000 mL | Freq: Once | INTRAVENOUS | Status: AC | PRN
  Administered 2019-07-24: 10 mL

## 2019-07-24 MED ORDER — SODIUM CHLORIDE 0.9 % IV SOLN
Freq: Once | INTRAVENOUS | Status: AC
  Filled 2019-07-24: qty 1000

## 2019-07-24 MED ORDER — LORAZEPAM 2 MG/ML IJ SOLN
0.2500 mg | Freq: Once | INTRAMUSCULAR | Status: AC
  Administered 2019-07-24: 0.25 mg via INTRAVENOUS
  Filled 2019-07-24: qty 1

## 2019-07-24 MED ORDER — ONDANSETRON HCL 4 MG/2ML IJ SOLN
4.0000 mg | Freq: Once | INTRAMUSCULAR | Status: AC
  Administered 2019-07-24: 4 mg via INTRAVENOUS
  Filled 2019-07-24: qty 2

## 2019-07-24 MED ORDER — ONDANSETRON HCL 4 MG PO TABS: 4.0000 mg | ORAL_TABLET | Freq: Three times a day (TID) | ORAL | 0 refills | Status: DC | PRN

## 2019-07-24 MED ORDER — HEPARIN SOD (PORK) LOCK FLUSH 100 UNIT/ML IV SOLN
500.0000 [IU] | Freq: Once | INTRAVENOUS | Status: AC | PRN
  Administered 2019-07-24: 500 [IU]

## 2019-07-24 NOTE — Patient Instructions (Signed)
[place] at [facility]  Discharge Instructions:  IV hydration and anti nausea medication received today. Call clinic for any questions or concerns. Follow up as scheduled. _______________________________________________________________  Thank you for choosing [place] at [facility] to provide your oncology and hematology care.  To afford each patient quality time with our providers, please arrive at least 15 minutes before your scheduled appointment.  You need to re-schedule your appointment if you arrive 10 or more minutes late.  We strive to give you quality time with our providers, and arriving late affects you and other patients whose appointments are after yours.  Also, if you no show three or more times for appointments you may be dismissed from the clinic.  Again, thank you for choosing [place] at [facility] hope is that these requests will allow you access to exceptional care and in a timely manner. _______________________________________________________________  If you have questions after your visit, please contact our office at [phone] between the hours of 8:30 a.m. and 5:00 p.m. Voicemails left after 4:30 p.m. will not be returned until the following business day. _______________________________________________________________  For prescription refill requests, have your pharmacy contact our office. _______________________________________________________________  Recommendations made by the consultant and any test results will be sent to your referring physician. _______________________________________________________________
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
## 2019-07-24 NOTE — Patient Instructions (Signed)
[facility] at [facility] Discharge Instructions  Follow up in 1 week with labs and fluids   Thank you for choosing [facility] at [facility] to provide your oncology and hematology care.  To afford each patient quality time with our provider, please arrive at least 15 minutes before your scheduled appointment time.   If you have a lab appointment with the Cancer Center please come in thru the Main Entrance and check in at the main information desk.  You need to re-schedule your appointment should you arrive 10 or more minutes late.  We strive to give you quality time with our providers, and arriving late affects you and other patients whose appointments are after yours.  Also, if you no show three or more times for appointments you may be dismissed from the clinic at the providers discretion.     Again, thank you for choosing [facility].  Our hope is that these requests will decrease the amount of time that you wait before being seen by our physicians.       _____________________________________________________________  Should you have questions after your visit to [facility], please contact our office at [phone] between the hours of 8:00 a.m. and 4:30 p.m.  Voicemails left after 4:00 p.m. will not be returned until the following business day.  For prescription refill requests, have your pharmacy contact our office and allow 72 hours.    Due to Covid, you will need to wear a mask upon entering the hospital. If you do not have a mask, a mask will be given to you at the Main Entrance upon arrival. For doctor visits, patients may have 1 support person with them. For treatment visits, patients can not have anyone with them due to social distancing guidelines and our immunocompromised population.      

## 2019-07-24 NOTE — Progress Notes (Signed)
Labs reviewed with Francene Finders NP. Will give hydration fluids with electrolytes today per orders.   Patient tolerated it well without problems. Vitals stable and discharged home from clinic ambulatory. Follow up as scheduled.

## 2019-07-24 NOTE — Assessment & Plan Note (Signed)
1.  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 -She will receive a 77-monthchemotherapy regimen. -She received her first CapeOX regimen on 07/19/2019. -She also started taking capecitabine 1500 mg twice daily, 2 weeks on 1 week off.  She is receiving oxaliplatin 130 mg on day 1 every 3 weeks. -She had severe side effects after her first treatment including numbness of her right arm.  Difficulty swallowing.  Extreme fatigue and weakness.  She also had nausea vomiting and diarrhea. -Labs done on 07/24/2019 showed her AST 401 ALT 229.  We will stop the capecitabine and recheck labs in 1 week. -She will get IV fluids today. -We will check her back in 1 week.  2.  Normocytic anemia: -Labs done on 07/24/2019 showed hemoglobin 11.8. -We will recheck labs at her next visit.  3.  Nausea/vomiting: -She was prescribed Compazine which she takes every 6 hours and still complains of nausea. -I will prescribe her Zofran to take at home.  4.  Anxiety: -Patient reports she has been very anxious since her diagnosis. -I will prescribe her Xanax for her to take at home.

## 2019-07-26 ENCOUNTER — Encounter (HOSPITAL_COMMUNITY): Payer: Self-pay | Admitting: *Deleted

## 2019-07-26 ENCOUNTER — Inpatient Hospital Stay (HOSPITAL_COMMUNITY): Payer: Medicare HMO

## 2019-07-26 ENCOUNTER — Ambulatory Visit (HOSPITAL_COMMUNITY): Payer: Medicare HMO | Admitting: Hematology

## 2019-07-26 NOTE — Progress Notes (Signed)
Patient's son called to advise that his mom called him last night and she seemed to be a little "high and happy" and had taken her full dose of xanax.  She reported to him that she was a little unsteady on her feet.  I advised him that she could take 1/2 tablet to see if it helped her sleep but didn't cause her to be so symptomatic.  I advised that she needs to take safety precautions in the home such as lighted hallways, no rugs and slide resistant socks/shoes.  I advised him to have her hold the wall or someone's hand/arm while walking if she felt very unsteady.  He states that she has only felt that way just last night, today she is better.    He also reports that she is still having trouble with controlling her bowel movements.  I educated him on the directions for imodium use.  I advised that if she is having more than 3-4 loose bowel movements in a day that she needs to call us and let us know.    He verbalizes understanding of the above.

## 2019-07-31 ENCOUNTER — Ambulatory Visit (HOSPITAL_COMMUNITY): Payer: Medicare HMO | Admitting: Hematology

## 2019-07-31 ENCOUNTER — Other Ambulatory Visit (HOSPITAL_COMMUNITY): Payer: Medicare HMO

## 2019-08-02 ENCOUNTER — Other Ambulatory Visit: Payer: Self-pay

## 2019-08-02 ENCOUNTER — Inpatient Hospital Stay (HOSPITAL_COMMUNITY): Payer: Medicare HMO | Admitting: Hematology

## 2019-08-02 ENCOUNTER — Ambulatory Visit (HOSPITAL_COMMUNITY): Payer: Medicare HMO

## 2019-08-02 ENCOUNTER — Inpatient Hospital Stay (HOSPITAL_COMMUNITY): Payer: Medicare HMO | Attending: Hematology

## 2019-08-02 VITALS — BP 138/68 | HR 69 | Temp 96.4°F | Resp 18 | Wt 130.8 lb

## 2019-08-02 DIAGNOSIS — Z452 Encounter for adjustment and management of vascular access device: Secondary | ICD-10-CM | POA: Diagnosis not present

## 2019-08-02 DIAGNOSIS — R634 Abnormal weight loss: Secondary | ICD-10-CM | POA: Diagnosis not present

## 2019-08-02 DIAGNOSIS — Z5111 Encounter for antineoplastic chemotherapy: Secondary | ICD-10-CM | POA: Diagnosis not present

## 2019-08-02 DIAGNOSIS — R112 Nausea with vomiting, unspecified: Secondary | ICD-10-CM | POA: Diagnosis not present

## 2019-08-02 DIAGNOSIS — R5383 Other fatigue: Secondary | ICD-10-CM | POA: Insufficient documentation

## 2019-08-02 DIAGNOSIS — C183 Malignant neoplasm of hepatic flexure: Secondary | ICD-10-CM | POA: Diagnosis not present

## 2019-08-02 DIAGNOSIS — D649 Anemia, unspecified: Secondary | ICD-10-CM | POA: Diagnosis not present

## 2019-08-02 DIAGNOSIS — E7800 Pure hypercholesterolemia, unspecified: Secondary | ICD-10-CM | POA: Insufficient documentation

## 2019-08-02 DIAGNOSIS — H409 Unspecified glaucoma: Secondary | ICD-10-CM | POA: Diagnosis not present

## 2019-08-02 DIAGNOSIS — R197 Diarrhea, unspecified: Secondary | ICD-10-CM | POA: Diagnosis not present

## 2019-08-02 DIAGNOSIS — R200 Anesthesia of skin: Secondary | ICD-10-CM | POA: Insufficient documentation

## 2019-08-02 DIAGNOSIS — R1310 Dysphagia, unspecified: Secondary | ICD-10-CM | POA: Insufficient documentation

## 2019-08-02 DIAGNOSIS — R7989 Other specified abnormal findings of blood chemistry: Secondary | ICD-10-CM | POA: Diagnosis not present

## 2019-08-02 DIAGNOSIS — F419 Anxiety disorder, unspecified: Secondary | ICD-10-CM | POA: Diagnosis not present

## 2019-08-02 DIAGNOSIS — C182 Malignant neoplasm of ascending colon: Secondary | ICD-10-CM

## 2019-08-02 DIAGNOSIS — E039 Hypothyroidism, unspecified: Secondary | ICD-10-CM | POA: Insufficient documentation

## 2019-08-02 DIAGNOSIS — R531 Weakness: Secondary | ICD-10-CM | POA: Insufficient documentation

## 2019-08-02 LAB — CBC WITH DIFFERENTIAL/PLATELET
Abs Immature Granulocytes: 0.01 10*3/uL (ref 0.00–0.07)
Basophils Absolute: 0.1 10*3/uL (ref 0.0–0.1)
Basophils Relative: 1 %
Eosinophils Absolute: 0.3 10*3/uL (ref 0.0–0.5)
Eosinophils Relative: 6 %
HCT: 29 % — ABNORMAL LOW (ref 36.0–46.0)
Hemoglobin: 9.8 g/dL — ABNORMAL LOW (ref 12.0–15.0)
Immature Granulocytes: 0 %
Lymphocytes Relative: 31 %
Lymphs Abs: 1.4 10*3/uL (ref 0.7–4.0)
MCH: 30.3 pg (ref 26.0–34.0)
MCHC: 33.8 g/dL (ref 30.0–36.0)
MCV: 89.8 fL (ref 80.0–100.0)
Monocytes Absolute: 0.7 10*3/uL (ref 0.1–1.0)
Monocytes Relative: 15 %
Neutro Abs: 2.1 10*3/uL (ref 1.7–7.7)
Neutrophils Relative %: 47 %
Platelets: 100 10*3/uL — ABNORMAL LOW (ref 150–400)
RBC: 3.23 MIL/uL — ABNORMAL LOW (ref 3.87–5.11)
RDW: 13.1 % (ref 11.5–15.5)
WBC: 4.5 10*3/uL (ref 4.0–10.5)
nRBC: 0 % (ref 0.0–0.2)

## 2019-08-02 LAB — COMPREHENSIVE METABOLIC PANEL
ALT: 57 U/L — ABNORMAL HIGH (ref 0–44)
AST: 48 U/L — ABNORMAL HIGH (ref 15–41)
Albumin: 3.8 g/dL (ref 3.5–5.0)
Alkaline Phosphatase: 83 U/L (ref 38–126)
Anion gap: 8 (ref 5–15)
BUN: 11 mg/dL (ref 8–23)
CO2: 25 mmol/L (ref 22–32)
Calcium: 8.9 mg/dL (ref 8.9–10.3)
Chloride: 105 mmol/L (ref 98–111)
Creatinine, Ser: 1.01 mg/dL — ABNORMAL HIGH (ref 0.44–1.00)
GFR calc Af Amer: >60 mL/min (ref >60)
GFR calc non Af Amer: 55 mL/min — ABNORMAL LOW (ref >60)
Glucose, Bld: 96 mg/dL (ref 70–99)
Potassium: 4.5 mmol/L (ref 3.5–5.1)
Sodium: 138 mmol/L (ref 135–145)
Total Bilirubin: 1 mg/dL (ref 0.3–1.2)
Total Protein: 6.3 g/dL — ABNORMAL LOW (ref 6.5–8.1)

## 2019-08-02 LAB — MAGNESIUM: Magnesium: 2 mg/dL (ref 1.7–2.4)

## 2019-08-02 NOTE — Assessment & Plan Note (Addendum)
1.  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 -First cycle of CapeOx on 07/19/2019. -She took capecitabine 15 mg twice daily for 1 week.  She received oxaliplatin 130 mg on day 1. -She has experienced severe side effects including fatigue, weakness, difficulty swallowing, numbness in the right arm.  She also had nausea and vomiting and diarrhea.  She also had elevated LFTs. -We held her capecitabine last week.  She received IV fluids.  She is feeling better today. -We reviewed her labs.  LFTs have improved although still elevated.  Platelet count was 100.  Globin dropped to 9.8. -I had a prolonged discussion with the patient and her son.  She is clearly not able to tolerate the current regimen. -I have recommended switching to FOLFOX regimen.  We will initially consider dose reduction or giving single agent 5-FU and then introducing oxaliplatin if she tolerates it well. -We will tentatively start next week.  We have discussed the side effects of this regimen in detail.  2.  Normocytic anemia: -Hemoglobin today decreased to 9.8.  We will closely monitor it.  3.  Anxiety: -She is taking Xanax 0.25 mg 1 tablet in the morning and half tablet in the evening which is helping.  4.  Nausea/vomiting: -She will continue Compazine every 6 hours as needed.  She will also take Zofran if Compazine does not help.

## 2019-08-02 NOTE — Progress Notes (Signed)
DISCONTINUE ON PATHWAY REGIMEN - Colorectal     A cycle is every 21 days:     Capecitabine      Oxaliplatin   **Always confirm dose/schedule in your pharmacy ordering system**  REASON: Toxicities / Adverse Event PRIOR TREATMENT: COS82: CapeOx q21 Days x 4 Cycles TREATMENT RESPONSE: N/A - Adjuvant Therapy  START ON PATHWAY REGIMEN - Colorectal     A cycle is every 14 days:     Oxaliplatin      Leucovorin      Fluorouracil      Fluorouracil   **Always confirm dose/schedule in your pharmacy ordering system**  Patient Characteristics: Postoperative without Neoadjuvant Therapy (Pathologic Staging), Colon, Stage III, Low Risk (pT1-3, pN1) Tumor Location: Colon Therapeutic Status: Postoperative without Neoadjuvant Therapy (Pathologic Staging) AJCC M Category: cM0 AJCC T Category: pT3 AJCC N Category: pN1c AJCC 8 Stage Grouping: IIIB Intent of Therapy: Curative Intent, Discussed with Patient

## 2019-08-02 NOTE — Progress Notes (Signed)
ON PATHWAY REGIMEN - Colorectal  No Change  Continue With Treatment as Ordered.     A cycle is every 21 days:     Capecitabine      Oxaliplatin   **Always confirm dose/schedule in your pharmacy ordering system**  Patient Characteristics: Postoperative without Neoadjuvant Therapy (Pathologic Staging), Colon, Stage III, Low Risk (pT1-3, pN1) Tumor Location: Colon Therapeutic Status: Postoperative without Neoadjuvant Therapy (Pathologic Staging) AJCC M Category: cM0 AJCC T Category: pT3 AJCC N Category: pN1c AJCC 8 Stage Grouping: IIIB Intent of Therapy: Curative Intent, Discussed with Patient

## 2019-08-02 NOTE — Progress Notes (Signed)
.  Pharmacist Chemotherapy Monitoring - Follow Up Assessment    I verify that I have reviewed each item in the below checklist:  . Regimen for the patient is scheduled for the appropriate day and plan matches scheduled date. Marland Kitchen Appropriate non-routine labs are ordered dependent on drug ordered. . If applicable, additional medications reviewed and ordered per protocol based on lifetime cumulative doses and/or treatment regimen.   Plan for follow-up and/or issues identified: Yes . I-vent associated with next due treatment: No . MD and/or nursing notified: Yes - add Emend?  Wynona Neat 08/02/2019 4:08 PM

## 2019-08-02 NOTE — Patient Instructions (Addendum)
[place] at [facility] Discharge Instructions  You were seen today by Dr. Delton Coombes. He went over your recent lab results. Dr. Delton Coombes recommends changing your treatment from the chemotherapy pills to the pump.  We will start this next week.  He will see you back in one week for labs, treatment and follow up.   Thank you for choosing [place] at [facility] to provide your oncology and hematology care.  To afford each patient quality time with our provider, please arrive at least 15 minutes before your scheduled appointment time.   If you have a lab appointment with the [facility] please come in thru the  Main Entrance and check in at the main information desk  You need to re-schedule your appointment should you arrive 10 or more minutes late.  We strive to give you quality time with our providers, and arriving late affects you and other patients whose appointments are after yours.  Also, if you no show three or more times for appointments you may be dismissed from the clinic at the providers discretion.     Again, thank you for choosing [facility].  Our hope is that these requests will decrease the amount of time that you wait before being seen by our physicians.       _____________________________________________________________  Should you have questions after your visit to [facility], please contact our office at [phone] between the hours of 8:00 a.m. and 4:30 p.m.  Voicemails left after 4:00 p.m. will not be returned until the following business day.  For prescription refill requests, have your pharmacy contact our office and allow 72 hours.    Cancer Center Support Programs:   > Cancer Support Group  2nd Tuesday of the month 1pm-2pm, Journey Room
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
## 2019-08-07 ENCOUNTER — Encounter (HOSPITAL_COMMUNITY): Payer: Self-pay | Admitting: Hematology

## 2019-08-07 ENCOUNTER — Inpatient Hospital Stay (HOSPITAL_COMMUNITY): Payer: Medicare HMO

## 2019-08-07 ENCOUNTER — Inpatient Hospital Stay (HOSPITAL_COMMUNITY): Payer: Medicare HMO | Admitting: Hematology

## 2019-08-07 ENCOUNTER — Other Ambulatory Visit: Payer: Self-pay

## 2019-08-07 VITALS — BP 116/61 | HR 66 | Temp 96.7°F | Resp 18 | Wt 132.8 lb

## 2019-08-07 DIAGNOSIS — D649 Anemia, unspecified: Secondary | ICD-10-CM | POA: Diagnosis not present

## 2019-08-07 DIAGNOSIS — E039 Hypothyroidism, unspecified: Secondary | ICD-10-CM | POA: Diagnosis not present

## 2019-08-07 DIAGNOSIS — E7800 Pure hypercholesterolemia, unspecified: Secondary | ICD-10-CM | POA: Diagnosis not present

## 2019-08-07 DIAGNOSIS — R112 Nausea with vomiting, unspecified: Secondary | ICD-10-CM | POA: Diagnosis not present

## 2019-08-07 DIAGNOSIS — C183 Malignant neoplasm of hepatic flexure: Secondary | ICD-10-CM | POA: Diagnosis not present

## 2019-08-07 DIAGNOSIS — H409 Unspecified glaucoma: Secondary | ICD-10-CM | POA: Diagnosis not present

## 2019-08-07 DIAGNOSIS — F419 Anxiety disorder, unspecified: Secondary | ICD-10-CM | POA: Diagnosis not present

## 2019-08-07 DIAGNOSIS — Z452 Encounter for adjustment and management of vascular access device: Secondary | ICD-10-CM | POA: Diagnosis not present

## 2019-08-07 DIAGNOSIS — Z5111 Encounter for antineoplastic chemotherapy: Secondary | ICD-10-CM | POA: Diagnosis not present

## 2019-08-07 LAB — CBC WITH DIFFERENTIAL/PLATELET
Abs Immature Granulocytes: 0 10*3/uL (ref 0.00–0.07)
Basophils Absolute: 0.1 10*3/uL (ref 0.0–0.1)
Basophils Relative: 2 %
Eosinophils Absolute: 0.4 10*3/uL (ref 0.0–0.5)
Eosinophils Relative: 15 %
HCT: 29.3 % — ABNORMAL LOW (ref 36.0–46.0)
Hemoglobin: 9.8 g/dL — ABNORMAL LOW (ref 12.0–15.0)
Immature Granulocytes: 0 %
Lymphocytes Relative: 33 %
Lymphs Abs: 0.9 10*3/uL (ref 0.7–4.0)
MCH: 30.5 pg (ref 26.0–34.0)
MCHC: 33.4 g/dL (ref 30.0–36.0)
MCV: 91.3 fL (ref 80.0–100.0)
Monocytes Absolute: 0.4 10*3/uL (ref 0.1–1.0)
Monocytes Relative: 14 %
Neutro Abs: 1 10*3/uL — ABNORMAL LOW (ref 1.7–7.7)
Neutrophils Relative %: 36 %
Platelets: 125 10*3/uL — ABNORMAL LOW (ref 150–400)
RBC: 3.21 MIL/uL — ABNORMAL LOW (ref 3.87–5.11)
RDW: 13.4 % (ref 11.5–15.5)
WBC: 2.7 10*3/uL — ABNORMAL LOW (ref 4.0–10.5)
nRBC: 0 % (ref 0.0–0.2)

## 2019-08-07 LAB — COMPREHENSIVE METABOLIC PANEL
ALT: 29 U/L (ref 0–44)
AST: 28 U/L (ref 15–41)
Albumin: 3.5 g/dL (ref 3.5–5.0)
Alkaline Phosphatase: 60 U/L (ref 38–126)
Anion gap: 7 (ref 5–15)
BUN: 7 mg/dL — ABNORMAL LOW (ref 8–23)
CO2: 26 mmol/L (ref 22–32)
Calcium: 8.9 mg/dL (ref 8.9–10.3)
Chloride: 106 mmol/L (ref 98–111)
Creatinine, Ser: 0.97 mg/dL (ref 0.44–1.00)
GFR calc Af Amer: >60 mL/min (ref >60)
GFR calc non Af Amer: 58 mL/min — ABNORMAL LOW (ref >60)
Glucose, Bld: 89 mg/dL (ref 70–99)
Potassium: 4.2 mmol/L (ref 3.5–5.1)
Sodium: 139 mmol/L (ref 135–145)
Total Bilirubin: 0.6 mg/dL (ref 0.3–1.2)
Total Protein: 6.1 g/dL — ABNORMAL LOW (ref 6.5–8.1)

## 2019-08-07 LAB — LACTATE DEHYDROGENASE: LDH: 169 U/L (ref 98–192)

## 2019-08-07 LAB — MAGNESIUM: Magnesium: 1.8 mg/dL (ref 1.7–2.4)

## 2019-08-07 NOTE — Assessment & Plan Note (Signed)
1.  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  First cycle of [place] ox on 07/19/2019. -She could not tolerate Cape ox regimen due to severe fatigue, weakness, difficulty swallowing, numbness in the right arm, nausea vomiting and diarrhea and elevated LFTs. -We talked about switching her to FOLFOX regimen with dose reduction. -Today we reviewed her labs.  White count is low at 2.7 with 36% neutrophils.  ANC is around 1000.  Hemoglobin 9.8. -We would hold off on her treatment today.  We will reevaluate her next week.  We will do her CBC 1 day prior to next week visit to see if she needs any growth factor.  2.  Normocytic anemia: -Hemoglobin today is 9.8.  This is from myelosuppression.  We will closely monitor it.  3.  Anxiety: -She will continue Xanax 0.25 mg in the morning and half tablet in the evening as needed.  4.  Nausea/vomiting: -She was told to use Compazine every 6 hours as needed.  She will have Zofran 4 mg as backup.
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
## 2019-08-07 NOTE — Progress Notes (Signed)
Patient has been assessed, vital signs and labs have been reviewed by Dr. Delton Coombes. ANC is 1.0 today, he will HOLD treatment today. SHe will return next week for labs and possible white cell booster shot and treatment the day after.

## 2019-08-07 NOTE — Patient Instructions (Signed)
[place] at [facility] Discharge Instructions  You were seen today by Dr. Delton Coombes. He went over your recent lab results. He will HOLD your treatment today. He will see you back in 1 week for labs, treatmnet and follow up.   Thank you for choosing [place] at [facility] to provide your oncology and hematology care.  To afford each patient quality time with our provider, please arrive at least 15 minutes before your scheduled appointment time.   If you have a lab appointment with the [facility] please come in thru the  Main Entrance and check in at the main information desk  You need to re-schedule your appointment should you arrive 10 or more minutes late.  We strive to give you quality time with our providers, and arriving late affects you and other patients whose appointments are after yours.  Also, if you no show three or more times for appointments you may be dismissed from the clinic at the providers discretion.     Again, thank you for choosing [facility].  Our hope is that these requests will decrease the amount of time that you wait before being seen by our physicians.       _____________________________________________________________  Should you have questions after your visit to [facility], please contact our office at [phone] between the hours of 8:00 a.m. and 4:30 p.m.  Voicemails left after 4:00 p.m. will not be returned until the following business day.  For prescription refill requests, have your pharmacy contact our office and allow 72 hours.    Cancer Center Support Programs:   > Cancer Support Group  2nd Tuesday of the month 1pm-2pm, Journey Room

## 2019-08-09 ENCOUNTER — Ambulatory Visit (HOSPITAL_COMMUNITY): Payer: Medicare HMO

## 2019-08-09 ENCOUNTER — Other Ambulatory Visit (HOSPITAL_COMMUNITY): Payer: Medicare HMO

## 2019-08-09 ENCOUNTER — Ambulatory Visit (HOSPITAL_COMMUNITY): Payer: Medicare HMO | Admitting: Hematology

## 2019-08-09 ENCOUNTER — Encounter (HOSPITAL_COMMUNITY): Payer: Medicare HMO

## 2019-08-14 ENCOUNTER — Inpatient Hospital Stay (HOSPITAL_COMMUNITY): Payer: Medicare HMO

## 2019-08-14 ENCOUNTER — Other Ambulatory Visit: Payer: Self-pay

## 2019-08-14 DIAGNOSIS — F419 Anxiety disorder, unspecified: Secondary | ICD-10-CM | POA: Diagnosis not present

## 2019-08-14 DIAGNOSIS — D649 Anemia, unspecified: Secondary | ICD-10-CM | POA: Diagnosis not present

## 2019-08-14 DIAGNOSIS — R112 Nausea with vomiting, unspecified: Secondary | ICD-10-CM | POA: Diagnosis not present

## 2019-08-14 DIAGNOSIS — C183 Malignant neoplasm of hepatic flexure: Secondary | ICD-10-CM | POA: Diagnosis not present

## 2019-08-14 DIAGNOSIS — E7800 Pure hypercholesterolemia, unspecified: Secondary | ICD-10-CM | POA: Diagnosis not present

## 2019-08-14 DIAGNOSIS — H409 Unspecified glaucoma: Secondary | ICD-10-CM | POA: Diagnosis not present

## 2019-08-14 DIAGNOSIS — Z5111 Encounter for antineoplastic chemotherapy: Secondary | ICD-10-CM | POA: Diagnosis not present

## 2019-08-14 DIAGNOSIS — Z452 Encounter for adjustment and management of vascular access device: Secondary | ICD-10-CM | POA: Diagnosis not present

## 2019-08-14 DIAGNOSIS — E039 Hypothyroidism, unspecified: Secondary | ICD-10-CM | POA: Diagnosis not present

## 2019-08-14 LAB — COMPREHENSIVE METABOLIC PANEL
ALT: 18 U/L (ref 0–44)
AST: 27 U/L (ref 15–41)
Albumin: 3.9 g/dL (ref 3.5–5.0)
Alkaline Phosphatase: 53 U/L (ref 38–126)
Anion gap: 9 (ref 5–15)
BUN: 9 mg/dL (ref 8–23)
CO2: 26 mmol/L (ref 22–32)
Calcium: 9.2 mg/dL (ref 8.9–10.3)
Chloride: 104 mmol/L (ref 98–111)
Creatinine, Ser: 0.87 mg/dL (ref 0.44–1.00)
GFR calc Af Amer: >60 mL/min (ref >60)
GFR calc non Af Amer: >60 mL/min (ref >60)
Glucose, Bld: 101 mg/dL — ABNORMAL HIGH (ref 70–99)
Potassium: 4.7 mmol/L (ref 3.5–5.1)
Sodium: 139 mmol/L (ref 135–145)
Total Bilirubin: 0.8 mg/dL (ref 0.3–1.2)
Total Protein: 6.5 g/dL (ref 6.5–8.1)

## 2019-08-14 LAB — LACTATE DEHYDROGENASE: LDH: 155 U/L (ref 98–192)

## 2019-08-14 LAB — CBC WITH DIFFERENTIAL/PLATELET
Abs Immature Granulocytes: 0.01 10*3/uL (ref 0.00–0.07)
Basophils Absolute: 0.1 10*3/uL (ref 0.0–0.1)
Basophils Relative: 1 %
Eosinophils Absolute: 0.4 10*3/uL (ref 0.0–0.5)
Eosinophils Relative: 10 %
HCT: 33 % — ABNORMAL LOW (ref 36.0–46.0)
Hemoglobin: 10.9 g/dL — ABNORMAL LOW (ref 12.0–15.0)
Immature Granulocytes: 0 %
Lymphocytes Relative: 36 %
Lymphs Abs: 1.7 10*3/uL (ref 0.7–4.0)
MCH: 29.8 pg (ref 26.0–34.0)
MCHC: 33 g/dL (ref 30.0–36.0)
MCV: 90.2 fL (ref 80.0–100.0)
Monocytes Absolute: 0.5 10*3/uL (ref 0.1–1.0)
Monocytes Relative: 10 %
Neutro Abs: 2 10*3/uL (ref 1.7–7.7)
Neutrophils Relative %: 43 %
Platelets: 135 10*3/uL — ABNORMAL LOW (ref 150–400)
RBC: 3.66 MIL/uL — ABNORMAL LOW (ref 3.87–5.11)
RDW: 13.3 % (ref 11.5–15.5)
WBC: 4.6 10*3/uL (ref 4.0–10.5)
nRBC: 0 % (ref 0.0–0.2)

## 2019-08-14 LAB — MAGNESIUM: Magnesium: 1.8 mg/dL (ref 1.7–2.4)

## 2019-08-14 NOTE — Progress Notes (Signed)
.   Pharmacist Chemotherapy Monitoring - Initial Assessment    Anticipated start date: 08/15/19   Regimen:  . Are orders appropriate based on the patient's diagnosis, regimen, and cycle? Yes . Does the plan date match the patient's scheduled date? Yes . Is the sequencing of drugs appropriate? Yes . Are the premedications appropriate for the patient's regimen? Yes . Prior Authorization for treatment is: Approved o If applicable, is the correct biosimilar selected based on the patient's insurance? yes  Organ Function and Labs: Marland Kitchen Are dose adjustments needed based on the patient's renal function, hepatic function, or hematologic function? No . Are appropriate labs ordered prior to the start of patient's treatment? Yes . Other organ system assessment, if indicated: N/A . The following baseline labs, if indicated, have been ordered: N/A  Dose Assessment: . Are the drug doses appropriate? Yes . Are the following correct: o Drug concentrations Yes o IV fluid compatible with drug Yes o Administration routes Yes o Timing of therapy Yes . If applicable, does the patient have documented access for treatment and/or plans for port-a-cath placement? yes . If applicable, have lifetime cumulative doses been properly documented and assessed? not applicable Lifetime Dose Tracking  . Oxaliplatin: 121.212 mg/m2 (200 mg) = 20.2 % of the maximum lifetime dose of 600 mg/m2  o   Toxicity Monitoring/Prevention: . The patient has the following take home antiemetics prescribed: Ondansetron and Prochlorperazine . The patient has the following take home medications prescribed: N/A . Medication allergies and previous infusion related reactions, if applicable, have been reviewed and addressed. Yes . The patient's current medication list has been assessed for drug-drug interactions with their chemotherapy regimen. no significant drug-drug interactions were identified on review.  Order Review: . Are the treatment  plan orders signed? No . Is the patient scheduled to see a provider prior to their treatment? Yes  . Sent MD message about additional pre-meds due to cycle #1 issues and dose clarification.  I verify that I have reviewed each item in the above checklist and answered each question accordingly.  Patricia Hurst 08/14/2019 3:21 PM

## 2019-08-14 NOTE — Progress Notes (Signed)
Labs reviewed by MD today. No injection needed per MD.

## 2019-08-15 ENCOUNTER — Encounter (HOSPITAL_COMMUNITY): Payer: Self-pay | Admitting: Hematology

## 2019-08-15 ENCOUNTER — Inpatient Hospital Stay (HOSPITAL_COMMUNITY): Payer: Medicare HMO

## 2019-08-15 ENCOUNTER — Ambulatory Visit (HOSPITAL_COMMUNITY): Payer: Medicare HMO | Admitting: Hematology

## 2019-08-15 ENCOUNTER — Inpatient Hospital Stay (HOSPITAL_COMMUNITY): Payer: Medicare HMO | Admitting: Hematology

## 2019-08-15 ENCOUNTER — Ambulatory Visit (HOSPITAL_COMMUNITY): Payer: Medicare HMO

## 2019-08-15 VITALS — BP 146/66 | HR 71 | Temp 97.4°F | Resp 18

## 2019-08-15 DIAGNOSIS — F419 Anxiety disorder, unspecified: Secondary | ICD-10-CM | POA: Diagnosis not present

## 2019-08-15 DIAGNOSIS — C182 Malignant neoplasm of ascending colon: Secondary | ICD-10-CM

## 2019-08-15 DIAGNOSIS — Z452 Encounter for adjustment and management of vascular access device: Secondary | ICD-10-CM | POA: Diagnosis not present

## 2019-08-15 DIAGNOSIS — D649 Anemia, unspecified: Secondary | ICD-10-CM | POA: Diagnosis not present

## 2019-08-15 DIAGNOSIS — C183 Malignant neoplasm of hepatic flexure: Secondary | ICD-10-CM

## 2019-08-15 DIAGNOSIS — H409 Unspecified glaucoma: Secondary | ICD-10-CM | POA: Diagnosis not present

## 2019-08-15 DIAGNOSIS — R112 Nausea with vomiting, unspecified: Secondary | ICD-10-CM | POA: Diagnosis not present

## 2019-08-15 DIAGNOSIS — E039 Hypothyroidism, unspecified: Secondary | ICD-10-CM | POA: Diagnosis not present

## 2019-08-15 DIAGNOSIS — Z5111 Encounter for antineoplastic chemotherapy: Secondary | ICD-10-CM | POA: Diagnosis not present

## 2019-08-15 DIAGNOSIS — E7800 Pure hypercholesterolemia, unspecified: Secondary | ICD-10-CM | POA: Diagnosis not present

## 2019-08-15 DIAGNOSIS — Z95828 Presence of other vascular implants and grafts: Secondary | ICD-10-CM

## 2019-08-15 MED ORDER — FLUOROURACIL CHEMO INJECTION 500 MG/10ML
320.0000 mg/m2 | Freq: Once | INTRAVENOUS | Status: AC
  Administered 2019-08-15: 500 mg via INTRAVENOUS
  Filled 2019-08-15: qty 10

## 2019-08-15 MED ORDER — DEXTROSE 5 % IV SOLN: Freq: Once | INTRAVENOUS | Status: AC

## 2019-08-15 MED ORDER — DIPHENHYDRAMINE HCL 50 MG/ML IJ SOLN
25.0000 mg | Freq: Once | INTRAMUSCULAR | Status: AC
  Administered 2019-08-15: 25 mg via INTRAVENOUS
  Filled 2019-08-15: qty 1

## 2019-08-15 MED ORDER — OXALIPLATIN CHEMO INJECTION 100 MG/20ML
68.0000 mg/m2 | Freq: Once | INTRAVENOUS | Status: AC
  Administered 2019-08-15: 110 mg via INTRAVENOUS
  Filled 2019-08-15: qty 20

## 2019-08-15 MED ORDER — PALONOSETRON HCL INJECTION 0.25 MG/5ML
0.2500 mg | Freq: Once | INTRAVENOUS | Status: AC
  Administered 2019-08-15: 0.25 mg via INTRAVENOUS
  Filled 2019-08-15: qty 5

## 2019-08-15 MED ORDER — LEUCOVORIN CALCIUM INJECTION 350 MG
320.0000 mg/m2 | Freq: Once | INTRAVENOUS | Status: AC
  Administered 2019-08-15: 518 mg via INTRAVENOUS
  Filled 2019-08-15: qty 25.9

## 2019-08-15 MED ORDER — SODIUM CHLORIDE 0.9 % IV SOLN
10.0000 mg | Freq: Once | INTRAVENOUS | Status: AC
  Administered 2019-08-15: 10 mg via INTRAVENOUS
  Filled 2019-08-15: qty 10

## 2019-08-15 MED ORDER — SODIUM CHLORIDE 0.9 % IV SOLN
150.0000 mg | Freq: Once | INTRAVENOUS | Status: AC
  Administered 2019-08-15: 150 mg via INTRAVENOUS
  Filled 2019-08-15: qty 150

## 2019-08-15 MED ORDER — SODIUM CHLORIDE 0.9 % IV SOLN
1920.0000 mg/m2 | INTRAVENOUS | Status: DC
  Administered 2019-08-15: 3100 mg via INTRAVENOUS
  Filled 2019-08-15: qty 62

## 2019-08-15 MED ORDER — FAMOTIDINE IN NACL 20-0.9 MG/50ML-% IV SOLN
20.0000 mg | Freq: Once | INTRAVENOUS | Status: AC
  Administered 2019-08-15: 20 mg via INTRAVENOUS
  Filled 2019-08-15: qty 50

## 2019-08-15 MED ORDER — SODIUM CHLORIDE 0.9% FLUSH
10.0000 mL | INTRAVENOUS | Status: DC | PRN
  Administered 2019-08-15: 10 mL

## 2019-08-15 NOTE — Patient Instructions (Signed)
[facility] at [facility] Discharge Instructions  You were seen today by Dr. Katragadda. He went over your recent lab results. He will see you back in 1 week for labs and follow up.   Thank you for choosing [facility] at [facility] to provide your oncology and hematology care.  To afford each patient quality time with our provider, please arrive at least 15 minutes before your scheduled appointment time.   If you have a lab appointment with the Cancer Center please come in thru the  Main Entrance and check in at the main information desk  You need to re-schedule your appointment should you arrive 10 or more minutes late.  We strive to give you quality time with our providers, and arriving late affects you and other patients whose appointments are after yours.  Also, if you no show three or more times for appointments you may be dismissed from the clinic at the providers discretion.     Again, thank you for choosing [facility].  Our hope is that these requests will decrease the amount of time that you wait before being seen by our physicians.       _____________________________________________________________  Should you have questions after your visit to [facility], please contact our office at [phone] between the hours of 8:00 a.m. and 4:30 p.m.  Voicemails left after 4:00 p.m. will not be returned until the following business day.  For prescription refill requests, have your pharmacy contact our office and allow 72 hours.    Cancer Center Support Programs:   > Cancer Support Group  2nd Tuesday of the month 1pm-2pm, Journey Room    

## 2019-08-15 NOTE — Progress Notes (Signed)
08/15/19  Due to reaction with CapeOx adding additional premedications:  Famotidine 20 mg IVPB x 1 Diphenhydramine 25 mg IVpush x 1  Treatment plan updated as above.  T.O. Dr Rhys Martini, PharmD

## 2019-08-15 NOTE — Progress Notes (Signed)
Patient has been assessed, vital signs and labs have been reviewed by Dr. Delton Coombes. ANC, Creatinine, LFTs, and Platelets are within treatment parameters per Dr. Delton Coombes. The patient is good to proceed with treatment at this time. Please give Oxaliplatin over 3 hours per Dr. Delton Coombes.

## 2019-08-15 NOTE — Progress Notes (Signed)
Patricia Hurst presents today for D1C1 FOLFOX. Pt received Oxaliplatin once previously. She reports her energy has been good and that her appetite has diminished since receiving her first dose of chemo. She had one episode of constipation 2 weeks ago, which was relieved with a suppository. She denies any other complaints. Vital signs and lab results are stable. Assessed by Dr. Delton Coombes who has approved proceeding with treatment today. Order received to infuse Oxaliplatin over 3 hours today due to previous reaction after first dose.   FOLFOX tolerated today without incident or complaint. VSS throughout treatment. 5FU pump education video shown to patient and son, [name]. Pump consent obtained and education packet thoroughly explained to patient and son. Chemotherapy spill kit given to patient. All questions answered and understanding verbalized. Return demonstration of pump operation shown by patient. Pump infusing through port without difficulties. Discharged in satisfactory condition with follow up instructions.

## 2019-08-15 NOTE — Assessment & Plan Note (Signed)
1.  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 -First cycle of [place] ox on 07/19/2019, poorly tolerated. -We reviewed her labs.  White count improved to 4.6.  Platelet count is 135.  LFTs are normal. -We talked about starting her on FOLFOX with 20% dose reduction.  I will also increase the infusion time of oxaliplatin to 3 hours with premedications including Benadryl and Pepcid. -We will see her back in 1 week for follow-up.  2.  Normocytic anemia: -Hemoglobin improved to 10.9.  This is from myelosuppression.  3.  Anxiety: -She will continue Xanax 0.25 mg in the morning and half tablet in the evening as needed.  4.  Nausea/vomiting: -We will continue Compazine every 6 hours as needed.  She has Zofran for backup.

## 2019-08-15 NOTE — Patient Instructions (Signed)
[facility] Discharge Instructions for Patients Receiving Chemotherapy   Beginning January 23rd 2017 lab work for the Cancer Center will be done in the  Main lab at [facility] on 1st floor. If you have a lab appointment with the Cancer Center please come in thru the  Main Entrance and check in at the main information desk   Today you received the following chemotherapy agents Oxaliplatin, Leucovorin, and 5FU  To help prevent nausea and vomiting after your treatment, we encourage you to take your nausea medication    If you develop nausea and vomiting, or diarrhea that is not controlled by your medication, call the clinic.  The clinic phone number is [phone]. Office hours are Monday-Friday 8:30am-5:00pm.  BELOW ARE SYMPTOMS THAT SHOULD BE REPORTED IMMEDIATELY:  *FEVER GREATER THAN 101.0 F  *CHILLS WITH OR WITHOUT FEVER  NAUSEA AND VOMITING THAT IS NOT CONTROLLED WITH YOUR NAUSEA MEDICATION  *UNUSUAL SHORTNESS OF BREATH  *UNUSUAL BRUISING OR BLEEDING  TENDERNESS IN MOUTH AND THROAT WITH OR WITHOUT PRESENCE OF ULCERS  *URINARY PROBLEMS  *BOWEL PROBLEMS  UNUSUAL RASH Items with * indicate a potential emergency and should be followed up as soon as possible. If you have an emergency after office hours please contact your primary care physician or go to the nearest emergency department.  Please call the clinic during office hours if you have any questions or concerns.   You may also contact the Patient Navigator at [phone] should you have any questions or need assistance in obtaining follow up care.      Resources For Cancer Patients and their Caregivers ? American Cancer Society: Can assist with transportation, wigs, general needs, runs Look Good Feel Better.        1-888-227-6333 ? Cancer Care: Provides financial assistance, online support groups, medication/co-pay assistance.  1-800-813-HOPE (4673) ? [facility] Assists [place] cancer patients and their families through emotional , educational and financial support.  336-427-4357 ? [place] DSS Where to apply for food stamps, Medicaid and utility assistance. 336-342-1394 ? [facility]: Transportation to medical appointments. 336-347-2287 ? Social Security Administration: May apply for disability if have a Stage IV cancer. 336-342-7796 1-800-772-1213 ? [place] Aging, Disability and Transit Services: Assists with nutrition, care and transit needs. 336-349-2343          
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
## 2019-08-16 MED ORDER — LANREOTIDE ACETATE 120 MG/0.5ML ~~LOC~~ SOLN
SUBCUTANEOUS | Status: AC
  Filled 2019-08-16: qty 120

## 2019-08-16 MED ORDER — FULVESTRANT 250 MG/5ML IM SOLN
INTRAMUSCULAR | Status: AC
  Filled 2019-08-16: qty 5

## 2019-08-17 ENCOUNTER — Encounter (HOSPITAL_COMMUNITY): Payer: Self-pay

## 2019-08-17 ENCOUNTER — Inpatient Hospital Stay (HOSPITAL_COMMUNITY): Payer: Medicare HMO

## 2019-08-17 ENCOUNTER — Other Ambulatory Visit: Payer: Self-pay

## 2019-08-17 VITALS — BP 154/72 | HR 66 | Temp 96.9°F | Resp 18

## 2019-08-17 DIAGNOSIS — C182 Malignant neoplasm of ascending colon: Secondary | ICD-10-CM

## 2019-08-17 DIAGNOSIS — Z452 Encounter for adjustment and management of vascular access device: Secondary | ICD-10-CM | POA: Diagnosis not present

## 2019-08-17 DIAGNOSIS — Z95828 Presence of other vascular implants and grafts: Secondary | ICD-10-CM

## 2019-08-17 DIAGNOSIS — E7800 Pure hypercholesterolemia, unspecified: Secondary | ICD-10-CM | POA: Diagnosis not present

## 2019-08-17 DIAGNOSIS — R112 Nausea with vomiting, unspecified: Secondary | ICD-10-CM | POA: Diagnosis not present

## 2019-08-17 DIAGNOSIS — Z5111 Encounter for antineoplastic chemotherapy: Secondary | ICD-10-CM | POA: Diagnosis not present

## 2019-08-17 DIAGNOSIS — C183 Malignant neoplasm of hepatic flexure: Secondary | ICD-10-CM | POA: Diagnosis not present

## 2019-08-17 DIAGNOSIS — F419 Anxiety disorder, unspecified: Secondary | ICD-10-CM | POA: Diagnosis not present

## 2019-08-17 DIAGNOSIS — H409 Unspecified glaucoma: Secondary | ICD-10-CM | POA: Diagnosis not present

## 2019-08-17 DIAGNOSIS — E039 Hypothyroidism, unspecified: Secondary | ICD-10-CM | POA: Diagnosis not present

## 2019-08-17 DIAGNOSIS — D649 Anemia, unspecified: Secondary | ICD-10-CM | POA: Diagnosis not present

## 2019-08-17 MED ORDER — SODIUM CHLORIDE 0.9% FLUSH
10.0000 mL | INTRAVENOUS | Status: DC | PRN
  Administered 2019-08-17: 10 mL

## 2019-08-17 MED ORDER — HEPARIN SOD (PORK) LOCK FLUSH 100 UNIT/ML IV SOLN
500.0000 [IU] | Freq: Once | INTRAVENOUS | Status: AC | PRN
  Administered 2019-08-17: 500 [IU]

## 2019-08-20 ENCOUNTER — Other Ambulatory Visit: Payer: Self-pay

## 2019-08-20 ENCOUNTER — Encounter (HOSPITAL_COMMUNITY): Payer: Self-pay | Admitting: Emergency Medicine

## 2019-08-20 ENCOUNTER — Emergency Department (HOSPITAL_COMMUNITY)
Admission: EM | Admit: 2019-08-20 | Discharge: 2019-08-21 | Disposition: A | Discharge status: Home / Self Care | Payer: Medicare HMO | Attending: Emergency Medicine | Admitting: Emergency Medicine

## 2019-08-20 DIAGNOSIS — R630 Anorexia: Secondary | ICD-10-CM | POA: Diagnosis not present

## 2019-08-20 DIAGNOSIS — R531 Weakness: Secondary | ICD-10-CM

## 2019-08-20 DIAGNOSIS — E860 Dehydration: Secondary | ICD-10-CM | POA: Diagnosis not present

## 2019-08-20 DIAGNOSIS — F419 Anxiety disorder, unspecified: Secondary | ICD-10-CM | POA: Diagnosis not present

## 2019-08-20 DIAGNOSIS — R197 Diarrhea, unspecified: Secondary | ICD-10-CM | POA: Diagnosis not present

## 2019-08-20 DIAGNOSIS — Z85038 Personal history of other malignant neoplasm of large intestine: Secondary | ICD-10-CM | POA: Diagnosis not present

## 2019-08-20 DIAGNOSIS — R112 Nausea with vomiting, unspecified: Secondary | ICD-10-CM | POA: Diagnosis not present

## 2019-08-20 DIAGNOSIS — E039 Hypothyroidism, unspecified: Secondary | ICD-10-CM | POA: Diagnosis not present

## 2019-08-20 DIAGNOSIS — R110 Nausea: Secondary | ICD-10-CM | POA: Diagnosis not present

## 2019-08-20 HISTORY — DX: Malignant (primary) neoplasm, unspecified: C80.1

## 2019-08-20 LAB — HEPATIC FUNCTION PANEL
ALT: 30 U/L (ref 0–44)
AST: 38 U/L (ref 15–41)
Albumin: 4.4 g/dL (ref 3.5–5.0)
Alkaline Phosphatase: 58 U/L (ref 38–126)
Bilirubin, Direct: 0.2 mg/dL (ref 0.0–0.2)
Indirect Bilirubin: 1.2 mg/dL — ABNORMAL HIGH (ref 0.3–0.9)
Total Bilirubin: 1.4 mg/dL — ABNORMAL HIGH (ref 0.3–1.2)
Total Protein: 7.6 g/dL (ref 6.5–8.1)

## 2019-08-20 LAB — CBC
HCT: 37.2 % (ref 36.0–46.0)
Hemoglobin: 12.8 g/dL (ref 12.0–15.0)
MCH: 30.1 pg (ref 26.0–34.0)
MCHC: 34.4 g/dL (ref 30.0–36.0)
MCV: 87.5 fL (ref 80.0–100.0)
Platelets: 127 10*3/uL — ABNORMAL LOW (ref 150–400)
RBC: 4.25 MIL/uL (ref 3.87–5.11)
RDW: 12.9 % (ref 11.5–15.5)
WBC: 6.8 10*3/uL (ref 4.0–10.5)
nRBC: 0 % (ref 0.0–0.2)

## 2019-08-20 LAB — BASIC METABOLIC PANEL
Anion gap: 12 (ref 5–15)
BUN: 13 mg/dL (ref 8–23)
CO2: 21 mmol/L — ABNORMAL LOW (ref 22–32)
Calcium: 9 mg/dL (ref 8.9–10.3)
Chloride: 99 mmol/L (ref 98–111)
Creatinine, Ser: 0.92 mg/dL (ref 0.44–1.00)
GFR calc Af Amer: >60 mL/min (ref >60)
GFR calc non Af Amer: >60 mL/min (ref >60)
Glucose, Bld: 94 mg/dL (ref 70–99)
Potassium: 3.8 mmol/L (ref 3.5–5.1)
Sodium: 132 mmol/L — ABNORMAL LOW (ref 135–145)

## 2019-08-20 LAB — LIPASE, BLOOD: Lipase: 60 U/L — ABNORMAL HIGH (ref 11–51)

## 2019-08-20 MED ORDER — SODIUM CHLORIDE 0.9 % IV BOLUS
1000.0000 mL | Freq: Once | INTRAVENOUS | Status: AC
  Administered 2019-08-20: 1000 mL via INTRAVENOUS

## 2019-08-20 MED ORDER — LORAZEPAM 2 MG/ML IJ SOLN
0.5000 mg | Freq: Once | INTRAMUSCULAR | Status: AC
  Administered 2019-08-20: 0.5 mg via INTRAVENOUS
  Filled 2019-08-20: qty 1

## 2019-08-20 MED ORDER — SODIUM CHLORIDE 0.9% FLUSH: 3.0000 mL | Freq: Once | INTRAVENOUS | Status: DC

## 2019-08-20 NOTE — ED Triage Notes (Signed)
Pt had chemo treatment last week and since then has been having diarrhea and weakness.

## 2019-08-20 NOTE — Discharge Instructions (Addendum)
You were seen the emergency department today with diarrhea and nausea in the setting of chemotherapy.  Please continue your home chemotherapy medications along with Imodium.  We have given IV fluids here and you are feeling better.  Please call your oncologist first thing tomorrow morning to discuss your ED visit and your symptoms to plan further treatments as needed. Return to the emergency department with any severe abdominal pain, return of symptoms, chest pain, passing out, fever, or other severe symptoms.
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
## 2019-08-21 LAB — URINALYSIS, ROUTINE W REFLEX MICROSCOPIC
Bilirubin Urine: NEGATIVE
Glucose, UA: NEGATIVE mg/dL
Hgb urine dipstick: NEGATIVE
Ketones, ur: NEGATIVE mg/dL
Leukocytes,Ua: NEGATIVE
Nitrite: NEGATIVE
Protein, ur: NEGATIVE mg/dL
Specific Gravity, Urine: 1.003 — ABNORMAL LOW (ref 1.005–1.030)
pH: 6 (ref 5.0–8.0)

## 2019-08-21 NOTE — ED Provider Notes (Signed)
Patient was left at change of shift to check the results of her urinalysis and make sure she was tolerating p.o. fluids.  Patient had recently started on chemotherapy and has had loss of appetite.  She has nausea medicine at home.  Per son patient has drank a whole cup of water and was doing well.  She is sleeping when I enter the room.  Results for orders placed or performed during the hospital encounter of 08/20/19  Urinalysis, Routine w reflex microscopic  Result Value Ref Range   Color, Urine STRAW (A) YELLOW   APPearance CLEAR CLEAR   Specific Gravity, Urine 1.003 (L) 1.005 - 1.030   pH 6.0 5.0 - 8.0   Glucose, UA NEGATIVE NEGATIVE mg/dL   Hgb urine dipstick NEGATIVE NEGATIVE   Bilirubin Urine NEGATIVE NEGATIVE   Ketones, ur NEGATIVE NEGATIVE mg/dL   Protein, ur NEGATIVE NEGATIVE mg/dL   Nitrite NEGATIVE NEGATIVE   Leukocytes,Ua NEGATIVE NEGATIVE    Diagnoses that have been ruled out:  None  Diagnoses that are still under consideration:  None  Final diagnoses:  Generalized weakness  Nausea  Diarrhea, unspecified type  Dehydration   Plan discharge  Rolland Porter, MD, [place], MD 08/21/19 [phone]

## 2019-08-21 NOTE — ED Notes (Signed)
Pt tolerating PO

## 2019-08-22 ENCOUNTER — Inpatient Hospital Stay (HOSPITAL_COMMUNITY): Payer: Medicare HMO

## 2019-08-22 ENCOUNTER — Other Ambulatory Visit: Payer: Self-pay

## 2019-08-22 ENCOUNTER — Inpatient Hospital Stay (HOSPITAL_COMMUNITY): Payer: Medicare HMO | Admitting: Hematology

## 2019-08-22 ENCOUNTER — Encounter (HOSPITAL_COMMUNITY): Payer: Self-pay | Admitting: Hematology

## 2019-08-22 VITALS — BP 137/64 | HR 72 | Temp 97.2°F | Resp 18

## 2019-08-22 DIAGNOSIS — E860 Dehydration: Secondary | ICD-10-CM

## 2019-08-22 DIAGNOSIS — F419 Anxiety disorder, unspecified: Secondary | ICD-10-CM | POA: Diagnosis not present

## 2019-08-22 DIAGNOSIS — H409 Unspecified glaucoma: Secondary | ICD-10-CM | POA: Diagnosis not present

## 2019-08-22 DIAGNOSIS — E7800 Pure hypercholesterolemia, unspecified: Secondary | ICD-10-CM | POA: Diagnosis not present

## 2019-08-22 DIAGNOSIS — C183 Malignant neoplasm of hepatic flexure: Secondary | ICD-10-CM

## 2019-08-22 DIAGNOSIS — E039 Hypothyroidism, unspecified: Secondary | ICD-10-CM | POA: Diagnosis not present

## 2019-08-22 DIAGNOSIS — R112 Nausea with vomiting, unspecified: Secondary | ICD-10-CM | POA: Diagnosis not present

## 2019-08-22 DIAGNOSIS — Z452 Encounter for adjustment and management of vascular access device: Secondary | ICD-10-CM | POA: Diagnosis not present

## 2019-08-22 DIAGNOSIS — D649 Anemia, unspecified: Secondary | ICD-10-CM | POA: Diagnosis not present

## 2019-08-22 DIAGNOSIS — Z5111 Encounter for antineoplastic chemotherapy: Secondary | ICD-10-CM | POA: Diagnosis not present

## 2019-08-22 LAB — COMPREHENSIVE METABOLIC PANEL
ALT: 69 U/L — ABNORMAL HIGH (ref 0–44)
AST: 95 U/L — ABNORMAL HIGH (ref 15–41)
Albumin: 4 g/dL (ref 3.5–5.0)
Alkaline Phosphatase: 79 U/L (ref 38–126)
Anion gap: 11 (ref 5–15)
BUN: 11 mg/dL (ref 8–23)
CO2: 23 mmol/L (ref 22–32)
Calcium: 9.3 mg/dL (ref 8.9–10.3)
Chloride: 105 mmol/L (ref 98–111)
Creatinine, Ser: 0.84 mg/dL (ref 0.44–1.00)
GFR calc Af Amer: >60 mL/min (ref >60)
GFR calc non Af Amer: >60 mL/min (ref >60)
Glucose, Bld: 104 mg/dL — ABNORMAL HIGH (ref 70–99)
Potassium: 4.6 mmol/L (ref 3.5–5.1)
Sodium: 139 mmol/L (ref 135–145)
Total Bilirubin: 1.2 mg/dL (ref 0.3–1.2)
Total Protein: 7 g/dL (ref 6.5–8.1)

## 2019-08-22 LAB — CBC WITH DIFFERENTIAL/PLATELET
Basophils Absolute: 0 10*3/uL (ref 0.0–0.1)
Basophils Relative: 0 %
Eosinophils Absolute: 1.6 10*3/uL — ABNORMAL HIGH (ref 0.0–0.5)
Eosinophils Relative: 31 %
HCT: 33.4 % — ABNORMAL LOW (ref 36.0–46.0)
Hemoglobin: 11.6 g/dL — ABNORMAL LOW (ref 12.0–15.0)
Lymphocytes Relative: 23 %
Lymphs Abs: 1.2 10*3/uL (ref 0.7–4.0)
MCH: 30.5 pg (ref 26.0–34.0)
MCHC: 34.7 g/dL (ref 30.0–36.0)
MCV: 87.9 fL (ref 80.0–100.0)
Monocytes Absolute: 0.3 10*3/uL (ref 0.1–1.0)
Monocytes Relative: 6 %
Neutro Abs: 2.1 10*3/uL (ref 1.7–7.7)
Neutrophils Relative %: 40 %
Platelets: 107 10*3/uL — ABNORMAL LOW (ref 150–400)
RBC: 3.8 MIL/uL — ABNORMAL LOW (ref 3.87–5.11)
RDW: 12.8 % (ref 11.5–15.5)
WBC: 5.3 10*3/uL (ref 4.0–10.5)
nRBC: 0 % (ref 0.0–0.2)

## 2019-08-22 MED ORDER — SODIUM CHLORIDE 0.9% FLUSH
10.0000 mL | Freq: Once | INTRAVENOUS | Status: AC | PRN
  Administered 2019-08-22: 14:00:00 10 mL

## 2019-08-22 MED ORDER — SODIUM CHLORIDE 0.9 % IV SOLN
Freq: Once | INTRAVENOUS | Status: AC
  Filled 2019-08-22: qty 1000

## 2019-08-22 MED ORDER — HEPARIN SOD (PORK) LOCK FLUSH 100 UNIT/ML IV SOLN
500.0000 [IU] | Freq: Once | INTRAVENOUS | Status: AC | PRN
  Administered 2019-08-22: 500 [IU]

## 2019-08-22 NOTE — Assessment & Plan Note (Signed)
1.  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 -First cycle of [place] ox on 07/19/2019, poorly tolerated.  First cycle of FOLFOX on 08/15/2019, 20% dose reduced. -She went to the ER with severe weakness on Monday evening. -She reported that on Saturday she started eating less.  Denied any nausea or vomiting.  She had diarrhea on Saturday 4-5 times per day.  She started taking Imodium on Monday.  She also reported cold sensitivity but denied any tingling or numbness in the extremities. -I reviewed her labs today.  AST was 95 and ALT was 69.  Total bilirubin is 1.2.  White count is 5.3 with platelet count 107. -We will give her 1 L of fluid with electrolytes today. -I had a prolonged discussion with the patient and her son.  Patient would not want to continue her treatment at this time because of side effects and intolerability. -We will respect her opinion and do close follow-ups with CEA levels every 3 months.  We will plan to repeat CT scan every 6 months during first 2 years.  2.  Normocytic anemia: -This is improved 11.6 today.  This is from myelosuppression.  3.  Anxiety: -She will continue Xanax 0.25 mg half tablet in the morning and evening as needed.  4.  Nausea/vomiting: -She will continue Compazine every 6 hours as needed.  She has Zofran for backup.  5.  Weight loss: -She had lost about 4-5 pounds.

## 2019-08-22 NOTE — Progress Notes (Signed)
Patient seen by Dr.Katragadda today. Message received from [facility] LPN/ Dr. Delton Coombes to infuse (house fluids ) Sodium Chloride 0.9% 1066mls with potassium chloride 20 mEq , magnesium sulfate 2 grams over 2 hours.   Treatment given today per MD orders. Tolerated infusion without adverse affects. Vital signs stable. No complaints at this time. Discharged from clinic ambulatory. F/U with [facility] as scheduled.
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
## 2019-08-22 NOTE — Patient Instructions (Addendum)
[facility] at [facility] Discharge Instructions  You were seen today by Dr. Katragadda. He went over your recent lab results. He will see you back in 1 week for labs and follow up.   Thank you for choosing [facility] at [facility] to provide your oncology and hematology care.  To afford each patient quality time with our provider, please arrive at least 15 minutes before your scheduled appointment time.   If you have a lab appointment with the Cancer Center please come in thru the  Main Entrance and check in at the main information desk  You need to re-schedule your appointment should you arrive 10 or more minutes late.  We strive to give you quality time with our providers, and arriving late affects you and other patients whose appointments are after yours.  Also, if you no show three or more times for appointments you may be dismissed from the clinic at the providers discretion.     Again, thank you for choosing [facility].  Our hope is that these requests will decrease the amount of time that you wait before being seen by our physicians.       _____________________________________________________________  Should you have questions after your visit to [facility], please contact our office at [phone] between the hours of 8:00 a.m. and 4:30 p.m.  Voicemails left after 4:00 p.m. will not be returned until the following business day.  For prescription refill requests, have your pharmacy contact our office and allow 72 hours.    Cancer Center Support Programs:   > Cancer Support Group  2nd Tuesday of the month 1pm-2pm, Journey Room    

## 2019-08-22 NOTE — Patient Instructions (Signed)
[facility] at [facility]  Discharge Instructions:   _______________________________________________________________  Thank you for choosing [facility] at [facility] to provide your oncology and hematology care.  To afford each patient quality time with our providers, please arrive at least 15 minutes before your scheduled appointment.  You need to re-schedule your appointment if you arrive 10 or more minutes late.  We strive to give you quality time with our providers, and arriving late affects you and other patients whose appointments are after yours.  Also, if you no show three or more times for appointments you may be dismissed from the clinic.  Again, thank you for choosing [facility] at [facility]. Our hope is that these requests will allow you access to exceptional care and in a timely manner. _______________________________________________________________  If you have questions after your visit, please contact our office at [phone] between the hours of 8:30 a.m. and 5:00 p.m. Voicemails left after 4:30 p.m. will not be returned until the following business day. _______________________________________________________________  For prescription refill requests, have your pharmacy contact our office. _______________________________________________________________  Recommendations made by the consultant and any test results will be sent to your referring physician. _______________________________________________________________ 

## 2019-08-24 ENCOUNTER — Other Ambulatory Visit (HOSPITAL_COMMUNITY): Payer: Self-pay | Admitting: Nurse Practitioner

## 2019-08-24 DIAGNOSIS — C183 Malignant neoplasm of hepatic flexure: Secondary | ICD-10-CM

## 2019-08-27 ENCOUNTER — Other Ambulatory Visit (HOSPITAL_COMMUNITY): Payer: Self-pay | Admitting: *Deleted

## 2019-08-27 ENCOUNTER — Other Ambulatory Visit: Payer: Self-pay

## 2019-08-27 ENCOUNTER — Emergency Department (HOSPITAL_COMMUNITY): Payer: Medicare HMO

## 2019-08-27 ENCOUNTER — Encounter (HOSPITAL_COMMUNITY): Payer: Self-pay | Admitting: *Deleted

## 2019-08-27 ENCOUNTER — Observation Stay (HOSPITAL_COMMUNITY)
Admission: EM | Admit: 2019-08-27 | Discharge: 2019-08-28 | Disposition: A | Discharge status: Home / Self Care | Payer: Medicare HMO | Attending: Family Medicine | Admitting: Family Medicine

## 2019-08-27 DIAGNOSIS — E876 Hypokalemia: Secondary | ICD-10-CM | POA: Diagnosis not present

## 2019-08-27 DIAGNOSIS — H409 Unspecified glaucoma: Secondary | ICD-10-CM

## 2019-08-27 DIAGNOSIS — Z7982 Long term (current) use of aspirin: Secondary | ICD-10-CM | POA: Diagnosis not present

## 2019-08-27 DIAGNOSIS — Z20822 Contact with and (suspected) exposure to covid-19: Secondary | ICD-10-CM | POA: Insufficient documentation

## 2019-08-27 DIAGNOSIS — C189 Malignant neoplasm of colon, unspecified: Secondary | ICD-10-CM | POA: Diagnosis not present

## 2019-08-27 DIAGNOSIS — R531 Weakness: Secondary | ICD-10-CM | POA: Diagnosis not present

## 2019-08-27 DIAGNOSIS — R29818 Other symptoms and signs involving the nervous system: Secondary | ICD-10-CM | POA: Diagnosis not present

## 2019-08-27 DIAGNOSIS — E039 Hypothyroidism, unspecified: Secondary | ICD-10-CM | POA: Diagnosis not present

## 2019-08-27 DIAGNOSIS — E860 Dehydration: Secondary | ICD-10-CM | POA: Insufficient documentation

## 2019-08-27 DIAGNOSIS — R4781 Slurred speech: Secondary | ICD-10-CM | POA: Diagnosis not present

## 2019-08-27 DIAGNOSIS — Z515 Encounter for palliative care: Secondary | ICD-10-CM

## 2019-08-27 DIAGNOSIS — Z7189 Other specified counseling: Secondary | ICD-10-CM

## 2019-08-27 DIAGNOSIS — J309 Allergic rhinitis, unspecified: Secondary | ICD-10-CM | POA: Diagnosis not present

## 2019-08-27 LAB — CBC WITH DIFFERENTIAL/PLATELET
Abs Immature Granulocytes: 0.01 10*3/uL (ref 0.00–0.07)
Basophils Absolute: 0 10*3/uL (ref 0.0–0.1)
Basophils Relative: 1 %
Eosinophils Absolute: 0.2 10*3/uL (ref 0.0–0.5)
Eosinophils Relative: 4 %
HCT: 31.1 % — ABNORMAL LOW (ref 36.0–46.0)
Hemoglobin: 10.8 g/dL — ABNORMAL LOW (ref 12.0–15.0)
Immature Granulocytes: 0 %
Lymphocytes Relative: 34 %
Lymphs Abs: 1.4 10*3/uL (ref 0.7–4.0)
MCH: 30.6 pg (ref 26.0–34.0)
MCHC: 34.7 g/dL (ref 30.0–36.0)
MCV: 88.1 fL (ref 80.0–100.0)
Monocytes Absolute: 0.4 10*3/uL (ref 0.1–1.0)
Monocytes Relative: 10 %
Neutro Abs: 2.2 10*3/uL (ref 1.7–7.7)
Neutrophils Relative %: 51 %
Platelets: 112 10*3/uL — ABNORMAL LOW (ref 150–400)
RBC: 3.53 MIL/uL — ABNORMAL LOW (ref 3.87–5.11)
RDW: 13.6 % (ref 11.5–15.5)
WBC: 4.2 10*3/uL (ref 4.0–10.5)
nRBC: 0 % (ref 0.0–0.2)

## 2019-08-27 LAB — COMPREHENSIVE METABOLIC PANEL
ALT: 34 U/L (ref 0–44)
AST: 35 U/L (ref 15–41)
Albumin: 4.1 g/dL (ref 3.5–5.0)
Alkaline Phosphatase: 55 U/L (ref 38–126)
Anion gap: 10 (ref 5–15)
BUN: 6 mg/dL — ABNORMAL LOW (ref 8–23)
CO2: 23 mmol/L (ref 22–32)
Calcium: 8.9 mg/dL (ref 8.9–10.3)
Chloride: 106 mmol/L (ref 98–111)
Creatinine, Ser: 0.9 mg/dL (ref 0.44–1.00)
GFR calc Af Amer: >60 mL/min (ref >60)
GFR calc non Af Amer: >60 mL/min (ref >60)
Glucose, Bld: 94 mg/dL (ref 70–99)
Potassium: 3.9 mmol/L (ref 3.5–5.1)
Sodium: 139 mmol/L (ref 135–145)
Total Bilirubin: 1.2 mg/dL (ref 0.3–1.2)
Total Protein: 6.5 g/dL (ref 6.5–8.1)

## 2019-08-27 LAB — RESPIRATORY PANEL BY RT PCR (FLU A&B, COVID)
Influenza A by PCR: NEGATIVE
Influenza B by PCR: NEGATIVE
SARS Coronavirus 2 by RT PCR: NEGATIVE

## 2019-08-27 LAB — TROPONIN I (HIGH SENSITIVITY)
Troponin I (High Sensitivity): 4 ng/L (ref <18)
Troponin I (High Sensitivity): 5 ng/L (ref <18)

## 2019-08-27 LAB — MAGNESIUM: Magnesium: 1.8 mg/dL (ref 1.7–2.4)

## 2019-08-27 MED ORDER — ACETAMINOPHEN 650 MG RE SUPP: 650.0000 mg | Freq: Four times a day (QID) | RECTAL | Status: DC | PRN

## 2019-08-27 MED ORDER — ACETAMINOPHEN 325 MG PO TABS: 650.0000 mg | ORAL_TABLET | Freq: Four times a day (QID) | ORAL | Status: DC | PRN

## 2019-08-27 MED ORDER — SODIUM CHLORIDE 0.9 % IV BOLUS
1000.0000 mL | Freq: Once | INTRAVENOUS | Status: AC
  Administered 2019-08-27: 1000 mL via INTRAVENOUS

## 2019-08-27 MED ORDER — TIMOLOL MALEATE 0.5 % OP SOLN
1.0000 [drp] | Freq: Every day | OPHTHALMIC | Status: DC
  Administered 2019-08-28: 1 [drp] via OPHTHALMIC
  Filled 2019-08-27: qty 5

## 2019-08-27 MED ORDER — ENSURE ENLIVE PO LIQD: 237.0000 mL | Freq: Two times a day (BID) | ORAL | Status: DC

## 2019-08-27 MED ORDER — TRAZODONE HCL 50 MG PO TABS: 50.0000 mg | ORAL_TABLET | Freq: Every evening | ORAL | Status: DC | PRN

## 2019-08-27 MED ORDER — LORATADINE 10 MG PO TABS
10.0000 mg | ORAL_TABLET | Freq: Every day | ORAL | Status: DC
  Administered 2019-08-28: 10 mg via ORAL
  Filled 2019-08-27: qty 1

## 2019-08-27 MED ORDER — POLYETHYLENE GLYCOL 3350 17 G PO PACK: 17.0000 g | PACK | Freq: Every day | ORAL | Status: DC | PRN

## 2019-08-27 MED ORDER — LEVOTHYROXINE SODIUM 25 MCG PO TABS
25.0000 ug | ORAL_TABLET | Freq: Every day | ORAL | Status: DC
  Administered 2019-08-28: 25 ug via ORAL
  Filled 2019-08-27: qty 1
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
## 2019-08-27 NOTE — ED Triage Notes (Signed)
Pt had chemotherapy x 2 weeks ago; 3 days ago pt states she started to have right side weakness and noticed she was drooling; pt also c/o pain to the middle of her back between her shoulder blades

## 2019-08-27 NOTE — Progress Notes (Signed)
I received a VM from patient stating that she was having some major issues, inability to eat because she can't open her mouth.  Before I had a chance to call her back, I received a phone call from her son , [name], and he advised that he felt like his mom was underplaying her symptoms.  For the past few days, she has had right side mouth "drawing" , she has been slobbering on herself, her speech is not slurred but is very weak. She has weakness on her right side and has not been able to walk steadily.  He said that this has been the same for the last few days if not a little better today.  I advised him to have her brought to the ER immediately for evaluation of possible stroke.  He states that he will pick his mom up and bring her here.    I have called Anderson Malta, RN triage in ER and given report.  Advised that patient will be coming private vehicle.
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
## 2019-08-28 ENCOUNTER — Encounter (HOSPITAL_COMMUNITY): Payer: Self-pay | Admitting: Family Medicine

## 2019-08-28 ENCOUNTER — Observation Stay (HOSPITAL_COMMUNITY): Payer: Medicare HMO

## 2019-08-28 DIAGNOSIS — E860 Dehydration: Secondary | ICD-10-CM | POA: Diagnosis not present

## 2019-08-28 DIAGNOSIS — C189 Malignant neoplasm of colon, unspecified: Secondary | ICD-10-CM | POA: Diagnosis not present

## 2019-08-28 DIAGNOSIS — R531 Weakness: Secondary | ICD-10-CM | POA: Diagnosis not present

## 2019-08-28 DIAGNOSIS — Z515 Encounter for palliative care: Secondary | ICD-10-CM | POA: Diagnosis not present

## 2019-08-28 DIAGNOSIS — Z7189 Other specified counseling: Secondary | ICD-10-CM

## 2019-08-28 DIAGNOSIS — E876 Hypokalemia: Secondary | ICD-10-CM | POA: Diagnosis not present

## 2019-08-28 LAB — CBC
HCT: 30.6 % — ABNORMAL LOW (ref 36.0–46.0)
Hemoglobin: 10.4 g/dL — ABNORMAL LOW (ref 12.0–15.0)
MCH: 30.1 pg (ref 26.0–34.0)
MCHC: 34 g/dL (ref 30.0–36.0)
MCV: 88.7 fL (ref 80.0–100.0)
Platelets: 95 10*3/uL — ABNORMAL LOW (ref 150–400)
RBC: 3.45 MIL/uL — ABNORMAL LOW (ref 3.87–5.11)
RDW: 13.6 % (ref 11.5–15.5)
WBC: 3.7 10*3/uL — ABNORMAL LOW (ref 4.0–10.5)
nRBC: 0 % (ref 0.0–0.2)

## 2019-08-28 LAB — COMPREHENSIVE METABOLIC PANEL
ALT: 31 U/L (ref 0–44)
AST: 36 U/L (ref 15–41)
Albumin: 3.7 g/dL (ref 3.5–5.0)
Alkaline Phosphatase: 49 U/L (ref 38–126)
Anion gap: 10 (ref 5–15)
BUN: 6 mg/dL — ABNORMAL LOW (ref 8–23)
CO2: 22 mmol/L (ref 22–32)
Calcium: 8.7 mg/dL — ABNORMAL LOW (ref 8.9–10.3)
Chloride: 107 mmol/L (ref 98–111)
Creatinine, Ser: 0.72 mg/dL (ref 0.44–1.00)
GFR calc Af Amer: >60 mL/min (ref >60)
GFR calc non Af Amer: >60 mL/min (ref >60)
Glucose, Bld: 83 mg/dL (ref 70–99)
Potassium: 3.4 mmol/L — ABNORMAL LOW (ref 3.5–5.1)
Sodium: 139 mmol/L (ref 135–145)
Total Bilirubin: 1.5 mg/dL — ABNORMAL HIGH (ref 0.3–1.2)
Total Protein: 6 g/dL — ABNORMAL LOW (ref 6.5–8.1)

## 2019-08-28 MED ORDER — POTASSIUM CHLORIDE ER 10 MEQ PO TBCR: 10.0000 meq | EXTENDED_RELEASE_TABLET | Freq: Every day | ORAL | 0 refills | Status: DC

## 2019-08-28 MED ORDER — ALPRAZOLAM 0.25 MG PO TABS
0.2500 mg | ORAL_TABLET | Freq: Once | ORAL | Status: AC | PRN
  Administered 2019-08-28: 0.25 mg via ORAL
  Filled 2019-08-28: qty 1

## 2019-08-28 MED ORDER — LORAZEPAM 2 MG/ML IJ SOLN: 0.5000 mg | Freq: Once | INTRAMUSCULAR | Status: DC | PRN

## 2019-08-28 MED ORDER — GADOBUTROL 1 MMOL/ML IV SOLN
7.0000 mL | Freq: Once | INTRAVENOUS | Status: AC | PRN
  Administered 2019-08-28: 7 mL via INTRAVENOUS

## 2019-08-28 MED ORDER — CHLORHEXIDINE GLUCONATE CLOTH 2 % EX PADS: 6.0000 | MEDICATED_PAD | Freq: Every day | CUTANEOUS | Status: DC

## 2019-08-28 MED ORDER — POTASSIUM CHLORIDE CRYS ER 20 MEQ PO TBCR
40.0000 meq | EXTENDED_RELEASE_TABLET | Freq: Once | ORAL | Status: AC
  Administered 2019-08-28: 40 meq via ORAL
  Filled 2019-08-28: qty 2

## 2019-08-28 NOTE — Evaluation (Signed)
Physical Therapy Evaluation Patient Details Name: Patricia Hurst MRN: [id number] DOB: 07/04/1945 Today's Date: 08/28/2019   History of Present Illness  Patricia Hurst is a 74 y.o. female with hx of recent diagnosis of adenocarcinoma of the colon s/p R hemicolectomy and two cycles of chemo no longer receiving treatment due to side effects, who presents for R sided weakness.  Clinical Impression  Pt admitted with above diagnosis. Pt slightly below baseline, demonstrating stiff gait pattern with decreased arm swing and short step length. Pt mainly c/o R sided facial deficits with most difficulty chewing on R side of mouth. Pt currently with functional limitations due to the deficits listed below (see PT Problem List). Pt will benefit from skilled PT to increase their independence and safety with mobility to allow discharge to the venue listed below.       Follow Up Recommendations No PT follow up    Equipment Recommendations  Cane    Recommendations for Other Services       Precautions / Restrictions Precautions Precautions: None Restrictions Weight Bearing Restrictions: No      Mobility  Bed Mobility Overal bed mobility: Independent       Transfers Overall transfer level: Modified independent Equipment used: None       Ambulation/Gait Ambulation/Gait assistance: Supervision Gait Distance (Feet): 100 Feet Assistive device: None Gait Pattern/deviations: Step-through pattern Gait velocity: slightly decreased   General Gait Details: stiff gait with decreased bil arm swing, short steps, good bil foot clearance and no foot drop noted, no loss of balance or unsteadiness noted  Stairs            Wheelchair Mobility    Modified Rankin (Stroke Patients Only)       Balance Overall balance assessment: Mild deficits observed, not formally tested              Pertinent Vitals/Pain Pain Assessment: No/denies pain    Home Living Family/patient expects to be  discharged to:: Private residence Living Arrangements: Spouse/significant other Available Help at Discharge: Family;Available PRN/intermittently Type of Home: House Home Access: Stairs to enter Entrance Stairs-Rails: Right;Left;Can reach both Entrance Stairs-Number of Steps: 6 Home Layout: One level Home Equipment: None Additional Comments: husband is currently hospitalized at [facility], pt has 2 sons and 1 daughter and 5 grandsons (ages 8-5) within 30 minutes    Prior Function Level of Independence: Independent         Comments: Independent in ADLs drives     Hand Dominance   Dominant Hand: Right    Extremity/Trunk Assessment   Upper Extremity Assessment Upper Extremity Assessment: Defer to OT evaluation    Lower Extremity Assessment Lower Extremity Assessment: Overall WFL for tasks assessed(BLE AROM WNL, strength 4+/5)    Cervical / Trunk Assessment Cervical / Trunk Assessment: Normal  Communication   Communication: No difficulties  Cognition Arousal/Alertness: Awake/alert Behavior During Therapy: WFL for tasks assessed/performed Overall Cognitive Status: Within Functional Limits for tasks assessed                                        General Comments      Exercises     Assessment/Plan    PT Assessment Patient needs continued PT services  PT Problem List Decreased activity tolerance;Decreased knowledge of use of DME;Decreased balance       PT Treatment Interventions DME instruction;Gait training;Stair training;Functional mobility training;Therapeutic activities;Therapeutic exercise;Balance training;Neuromuscular  re-education;Patient/family education    PT Goals (Current goals can be found in the Care Plan section)  Acute Rehab PT Goals Patient Stated Goal: go home today PT Goal Formulation: With patient Time For Goal Achievement: 08/31/19 Potential to Achieve Goals: Good    Frequency Min 3X/week   Barriers to discharge         Co-evaluation PT/OT/SLP Co-Evaluation/Treatment: Yes Reason for Co-Treatment: Complexity of the patient's impairments (multi-system involvement);To address functional/ADL transfers PT goals addressed during session: Mobility/safety with mobility;Balance;Proper use of DME;Strengthening/ROM OT goals addressed during session: ADL's and self-care       AM-PAC PT "6 Clicks" Mobility  Outcome Measure Help needed turning from your back to your side while in a flat bed without using bedrails?: None Help needed moving from lying on your back to sitting on the side of a flat bed without using bedrails?: None Help needed moving to and from a bed to a chair (including a wheelchair)?: None Help needed standing up from a chair using your arms (e.g., wheelchair or bedside chair)?: None Help needed to walk in hospital room?: A Little Help needed climbing 3-5 steps with a railing? : A Little 6 Click Score: 22    End of Session   Activity Tolerance: Patient tolerated treatment well Patient left: in chair;with call bell/phone within reach Nurse Communication: Mobility status PT Visit Diagnosis: Other abnormalities of gait and mobility (R26.89)    Time: [id number] PT Time Calculation (min) (ACUTE ONLY): 14 min   Charges:   PT Evaluation $PT Eval Low Complexity: 1 Low          Tori Giovany Cosby PT, DPT 08/28/19, 10:54 AM [phone]

## 2019-08-28 NOTE — Discharge Summary (Signed)
Physician Discharge Summary  Patricia Hurst [id number] DOB: 1946/01/07 DOA: 08/27/2019  PCP: Berenice Primas Oncology: Delton Coombes  Admit date: 08/27/2019 Discharge date: 08/28/2019  Admitted From:  Home  Disposition:  Home   Recommendations for Outpatient Follow-up:  1. Follow up with Dr. Delton Coombes tomorrow as scheduled 2. Follow up with PCP in 1 weeks 3. Please obtain BMP in 1 week  Discharge Condition: STABLE   CODE STATUS: FULL    Brief Hospitalization Summary: Please see all hospital notes, images, labs for full details of the hospitalization. ADMISSION HPI: Patricia Hurst is a 74 y.o. female with hx of recent diagnosis of adenocarcinoma of the colon s/p R hemicolectomy and two cycles of chemo no longer receiving treatment due to side effects, who presents for R sided weakness.  Reports hx of recent diagnosis of colon CA with resection followed by chemo First tried pills and infusion but unable to tolerate Second time tried only infusions, wasn't able to tolerate so she is no longer receiving treatment She plans to do surveillance with her oncologist and have blood work and imaging done periodically  For about 3-4 days feels like she has been drooling out of the R side of her mouth R side of her body also feels "not right", and weaker than the left side Denies numbness or tingling Denies feeling unsteady while walking but does feel like she needs to take her time due to weakness Son reports she looks unsteady as she walks She endorses a long hx of headaches which are unchanged from usual Denies blurry or double vision Endorses unsteady hand when signing checks  In ED given 1L NS and admitted for expedited workup of neurological symptoms.   The patient was admitted for observation and management of right-sided weakness.  She was noted to be hypokalemic and given supplement.  She was given IV fluid hydration and supportive care.  MRI brain ordered with and without  contrast and there were no findings of metastatic disease.  PT evaluated the patient and patient opted for outpatient physical therapy which has been ordered.  No OT needs identified.  Feels a lot better after fluids and feels ready to go home.  She has a follow-up appointment tomorrow with her oncologist which advised her to keep that appointment also advised her to follow-up with her PCP in 1 week as well.  I recommend rechecking labs in 1 week to check potassium levels.  Discharge Diagnoses:  Principal Problem:   Hypokalemia Active Problems:   Dehydration   Adenocarcinoma, colon [facility])   Discharge Instructions: Discharge Instructions    Ambulatory referral to Physical Therapy   Complete by: As directed      Allergies as of 08/28/2019      Reactions   Morphine Sulfate Rash      Medication List    STOP taking these medications   ondansetron 4 MG tablet Commonly known as: Zofran   Xeloda 500 MG tablet Generic drug: capecitabine     TAKE these medications   acetaminophen 500 MG tablet Commonly known as: TYLENOL Take 500 mg by mouth See admin instructions. Take 500 mg at night, may take a second 500 mg dose as needed for pain   ALPRAZolam 0.25 MG tablet Commonly known as: XANAX Take 0.5 tablets (0.125 mg total) by mouth 2 (two) times daily as needed for anxiety.   alteplase 2 MG injection Commonly known as: CATHFLO ACTIVASE 2 mg by Intracatheter route once as needed for open catheter.  aspirin EC 81 MG tablet Take 1 tablet (81 mg total) by mouth daily.   BLUE-EMU MAXIMUM STRENGTH EX Apply 1 application topically daily as needed (joint pain).   CALCIUM 500 + D3 PO Take 1 tablet by mouth at bedtime.   filgrastim 480 MCG/1.6ML injection Commonly known as: NEUPOGEN Inject 480 mcg into the skin once.   levothyroxine 25 MCG tablet Commonly known as: SYNTHROID Take 25 mcg by mouth daily before breakfast.   loperamide 2 MG tablet Commonly known as: IMODIUM A-D Take  2-4 mg by mouth 4 (four) times daily as needed for diarrhea or loose stools.   loratadine 10 MG tablet Commonly known as: CLARITIN Take 10 mg by mouth daily.   multivitamin with minerals Tabs tablet Take 1 tablet by mouth at bedtime.   oxaliplatin in dextrose 5 % 500 mL Inject 130 mg/m2 into the vein every 21 ( twenty-one) days.   potassium chloride 10 MEQ tablet Commonly known as: KLOR-CON Take 1 tablet (10 mEq total) by mouth daily for 5 days.   prochlorperazine 10 MG tablet Commonly known as: COMPAZINE Take 10 mg by mouth every 6 (six) hours as needed for nausea or vomiting.   psyllium 58.6 % powder Commonly known as: METAMUCIL Take 1 packet by mouth daily as needed (for constipation). Mixed with OJ   THERAWORX RELIEF EX Apply 1 application topically daily as needed (pain).   timolol 0.5 % ophthalmic solution Commonly known as: TIMOPTIC Place 1 drop into both eyes daily.      Follow-up Information    Derek Jack, MD. Go in 1 day(s).   Specialty: Hematology Contact information: [place] [address] [phone]        Berenice Primas Schedule an appointment as soon as possible for a visit in 1 week(s).   Specialty: Nurse Practitioner Contact information: [place] [address] [phone]          Allergies  Allergen Reactions  . Morphine Sulfate Rash   Allergies as of 08/28/2019      Reactions   Morphine Sulfate Rash      Medication List    STOP taking these medications   ondansetron 4 MG tablet Commonly known as: Zofran   Xeloda 500 MG tablet Generic drug: capecitabine     TAKE these medications   acetaminophen 500 MG tablet Commonly known as: TYLENOL Take 500 mg by mouth See admin instructions. Take 500 mg at night, may take a second 500 mg dose as needed for pain   ALPRAZolam 0.25 MG tablet Commonly known as: XANAX Take 0.5 tablets (0.125 mg total) by mouth 2 (two) times daily as needed for  anxiety.   alteplase 2 MG injection Commonly known as: CATHFLO ACTIVASE 2 mg by Intracatheter route once as needed for open catheter.   aspirin EC 81 MG tablet Take 1 tablet (81 mg total) by mouth daily.   BLUE-EMU MAXIMUM STRENGTH EX Apply 1 application topically daily as needed (joint pain).   CALCIUM 500 + D3 PO Take 1 tablet by mouth at bedtime.   filgrastim 480 MCG/1.6ML injection Commonly known as: NEUPOGEN Inject 480 mcg into the skin once.   levothyroxine 25 MCG tablet Commonly known as: SYNTHROID Take 25 mcg by mouth daily before breakfast.   loperamide 2 MG tablet Commonly known as: IMODIUM A-D Take 2-4 mg by mouth 4 (four) times daily as needed for diarrhea or loose stools.   loratadine 10 MG tablet Commonly known as:  CLARITIN Take 10 mg by mouth daily.   multivitamin with minerals Tabs tablet Take 1 tablet by mouth at bedtime.   oxaliplatin in dextrose 5 % 500 mL Inject 130 mg/m2 into the vein every 21 ( twenty-one) days.   potassium chloride 10 MEQ tablet Commonly known as: KLOR-CON Take 1 tablet (10 mEq total) by mouth daily for 5 days.   prochlorperazine 10 MG tablet Commonly known as: COMPAZINE Take 10 mg by mouth every 6 (six) hours as needed for nausea or vomiting.   psyllium 58.6 % powder Commonly known as: METAMUCIL Take 1 packet by mouth daily as needed (for constipation). Mixed with OJ   THERAWORX RELIEF EX Apply 1 application topically daily as needed (pain).   timolol 0.5 % ophthalmic solution Commonly known as: TIMOPTIC Place 1 drop into both eyes daily.       Procedures/Studies: DG Chest 2 View  Result Date: 08/27/2019 CLINICAL DATA:  Weakness. EXAM: CHEST - 2 VIEW COMPARISON:  July 19, 2019 FINDINGS: There is stable positioning of the left-sided Port-A-Cath. The tip projects over the left brachiocephalic vein. There is no pneumothorax or focal infiltrate. The heart size is stable. There is no significant pleural effusion. The  lungs remain slightly hyperexpanded. IMPRESSION: No active cardiopulmonary disease. Electronically Signed   By: Constance Holster M.D.   On: 08/27/2019 15:46   CT Head Wo Contrast  Result Date: 08/27/2019 CLINICAL DATA:  Focal neuro deficit, greater than 6 hours, stroke suspected. Additional history provided: Colon cancer, weakness, drooling, slurred speech, received second chemo treatment 2 weeks ago. EXAM: CT HEAD WITHOUT CONTRAST TECHNIQUE: Contiguous axial images were obtained from the base of the skull through the vertex without intravenous contrast. COMPARISON:  Report from MRI/MRA head 06/09/2017 (images unavailable) FINDINGS: Brain: Please note there is limited assessment for intracranial metastatic disease on this noncontrast head CT. Cerebral volume is normal for age. There is no acute intracranial hemorrhage. No demarcated cortical infarct. No extra-axial fluid collection. No evidence of intracranial mass. No midline shift. Vascular: No hyperdense vessel. Atherosclerotic calcifications. Skull: Normal. Negative for fracture or focal lesion. Sinuses/Orbits: Visualized orbits show no acute finding. No significant paranasal sinus disease or mastoid effusion at the imaged levels. IMPRESSION: Please note there is limited assessment for intracranial metastatic disease on this non-contrast head CT. Unremarkable non-contrast CT appearance of the brain. No evidence of acute intracranial abnormality. Electronically Signed   By: Kellie Simmering DO   On: 08/27/2019 16:10   MR BRAIN W WO CONTRAST  Result Date: 08/28/2019 CLINICAL DATA:  74 year old female with colon adenocarcinoma. New right side weakness and loss of coordination. Chemotherapy 2 weeks ago. EXAM: MRI HEAD WITHOUT AND WITH CONTRAST TECHNIQUE: Multiplanar, multiecho pulse sequences of the brain and surrounding structures were obtained without and with intravenous contrast. CONTRAST:  71mL GADAVIST GADOBUTROL 1 MMOL/ML IV SOLN COMPARISON:  Head CT  without contrast yesterday. Report of [facility] brain MRI and intracranial MRA 06/09/2017 (no images available). FINDINGS: Brain: Cerebral volume is within normal limits for age. No restricted diffusion to suggest acute infarction. No midline shift, mass effect, evidence of mass lesion, ventriculomegaly, extra-axial collection or acute intracranial hemorrhage. Cervicomedullary junction and pituitary are within normal limits. No abnormal enhancement identified.  No dural thickening. Pearline Cables and white matter signal is within normal limits for age throughout the brain. No chronic cerebral blood products or convincing encephalomalacia. Vascular: Major intracranial vascular flow voids are preserved. The right vertebral artery appears dominant. The major dural venous sinuses  are enhancing and appear to be patent. Skull and upper cervical spine: Negative for age visible cervical spine. Visualized bone marrow signal is within normal limits. Sinuses/Orbits: Negative orbits. Paranasal sinuses and mastoids are stable and well pneumatized. Other: Visible internal auditory structures appear normal. Mild left parotid gland atrophy suspected. Otherwise negative visible face and scalp soft tissues. IMPRESSION: No metastatic disease or acute intracranial abnormality. Normal for age MRI appearance of the brain. No explanation for weakness. Electronically Signed   By: Genevie Ann M.D.   On: 08/28/2019 09:49     Subjective: Pt says she is feeling better after receiving the IV fluids.    Discharge Exam: Vitals:   08/28/19 0331 08/28/19 1335  BP: (!) 119/58 116/60  Pulse: 69 78  Resp: 20 18  Temp: 98.9 F (37.2 C) 98.3 F (36.8 C)  SpO2: 100% 100%   Vitals:   08/27/19 1934 08/27/19 2335 08/28/19 0331 08/28/19 1335  BP: (!) 149/66 134/68 (!) 119/58 116/60  Pulse: 73 72 69 78  Resp: 18 16 20 18   Temp: 99 F (37.2 C) 98.6 F (37 C) 98.9 F (37.2 C) 98.3 F (36.8 C)  TempSrc: Oral Oral Oral Oral  SpO2: 100%  97% 100% 100%  Weight: 57 kg     Height: 5\' 2"  (1.575 m)      General: Pt is alert, awake, not in acute distress Cardiovascular: RRR, S1/S2 +, no rubs, no gallops Respiratory: CTA bilaterally, no wheezing, no rhonchi Abdominal: Soft, NT, ND, bowel sounds + Extremities: no edema, no cyanosis   The results of significant diagnostics from this hospitalization (including imaging, microbiology, ancillary and laboratory) are listed below for reference.     Microbiology: Recent Results (from the past 240 hour(s))  Respiratory Panel by RT PCR (Flu A&B, Covid) - Nasopharyngeal Swab     Status: None   Collection Time: 08/27/19  5:22 PM   Specimen: Nasopharyngeal Swab  Result Value Ref Range Status   SARS Coronavirus 2 by RT PCR NEGATIVE NEGATIVE Final    Comment: (NOTE) SARS-CoV-2 target nucleic acids are NOT DETECTED. The SARS-CoV-2 RNA is generally detectable in upper respiratoy specimens during the acute phase of infection. The lowest concentration of SARS-CoV-2 viral copies this assay can detect is 131 copies/mL. A negative result does not preclude SARS-Cov-2 infection and should not be used as the sole basis for treatment or other patient management decisions. A negative result may occur with  improper specimen collection/handling, submission of specimen other than nasopharyngeal swab, presence of viral mutation(s) within the areas targeted by this assay, and inadequate number of viral copies (<131 copies/mL). A negative result must be combined with clinical observations, patient history, and epidemiological information. The expected result is Negative. Fact Sheet for Patients:  PinkCheek.be Fact Sheet for Healthcare Providers:  GravelBags.it This test is not yet ap proved or cleared by the [place] FDA and  has been authorized for detection and/or diagnosis of SARS-CoV-2 by FDA under an Emergency Use Authorization  (EUA). This EUA will remain  in effect (meaning this test can be used) for the duration of the COVID-19 declaration under Section 564(b)(1) of the Act, 21 U.S.C. section 360bbb-3(b)(1), unless the authorization is terminated or revoked sooner.    Influenza A by PCR NEGATIVE NEGATIVE Final   Influenza B by PCR NEGATIVE NEGATIVE Final    Comment: (NOTE) The Xpert Xpress SARS-CoV-2/FLU/RSV assay is intended as an aid in  the diagnosis of influenza from Nasopharyngeal swab specimens and  should  not be used as a sole basis for treatment. Nasal washings and  aspirates are unacceptable for Xpert Xpress SARS-CoV-2/FLU/RSV  testing. Fact Sheet for Patients: PinkCheek.be Fact Sheet for Healthcare Providers: GravelBags.it This test is not yet approved or cleared by the [place] FDA and  has been authorized for detection and/or diagnosis of SARS-CoV-2 by  FDA under an Emergency Use Authorization (EUA). This EUA will remain  in effect (meaning this test can be used) for the duration of the  Covid-19 declaration under Section 564(b)(1) of the Act, 21  U.S.C. section 360bbb-3(b)(1), unless the authorization is  terminated or revoked. Performed at [facility], [address], [place], [place] [address]      Labs: BNP (last 3 results) No results for input(s): BNP in the last 8760 hours. Basic Metabolic Panel: Recent Labs  Lab 08/22/19 1018 08/27/19 1511 08/28/19 0454  NA 139 139 139  K 4.6 3.9 3.4*  CL 105 106 107  CO2 23 23 22   GLUCOSE 104* 94 83  BUN 11 6* 6*  CREATININE 0.84 0.90 0.72  CALCIUM 9.3 8.9 8.7*  MG  --  1.8  --    Liver Function Tests: Recent Labs  Lab 08/22/19 1018 08/27/19 1511 08/28/19 0454  AST 95* 35 36  ALT 69* 34 31  ALKPHOS 79 55 49  BILITOT 1.2 1.2 1.5*  PROT 7.0 6.5 6.0*  ALBUMIN 4.0 4.1 3.7   No results for input(s): LIPASE, AMYLASE in the last 168 hours. No results for input(s):  AMMONIA in the last 168 hours. CBC: Recent Labs  Lab 08/22/19 1018 08/27/19 1511 08/28/19 0454  WBC 5.3 4.2 3.7*  NEUTROABS 2.1 2.2  --   HGB 11.6* 10.8* 10.4*  HCT 33.4* 31.1* 30.6*  MCV 87.9 88.1 88.7  PLT 107* 112* 95*   Cardiac Enzymes: No results for input(s): CKTOTAL, CKMB, CKMBINDEX, TROPONINI in the last 168 hours. BNP: Invalid input(s): POCBNP CBG: No results for input(s): GLUCAP in the last 168 hours. D-Dimer No results for input(s): DDIMER in the last 72 hours. Hgb A1c No results for input(s): HGBA1C in the last 72 hours. Lipid Profile No results for input(s): CHOL, HDL, LDLCALC, TRIG, CHOLHDL, LDLDIRECT in the last 72 hours. Thyroid function studies No results for input(s): TSH, T4TOTAL, T3FREE, THYROIDAB in the last 72 hours.  Invalid input(s): FREET3 Anemia work up No results for input(s): VITAMINB12, FOLATE, FERRITIN, TIBC, IRON, RETICCTPCT in the last 72 hours. Urinalysis    Component Value Date/Time   COLORURINE STRAW (A) 08/20/2019 2358   APPEARANCEUR CLEAR 08/20/2019 2358   LABSPEC 1.003 (L) 08/20/2019 2358   PHURINE 6.0 08/20/2019 2358   GLUCOSEU NEGATIVE 08/20/2019 2358   HGBUR NEGATIVE 08/20/2019 2358   BILIRUBINUR NEGATIVE 08/20/2019 2358   KETONESUR NEGATIVE 08/20/2019 2358   PROTEINUR NEGATIVE 08/20/2019 2358   NITRITE NEGATIVE 08/20/2019 2358   LEUKOCYTESUR NEGATIVE 08/20/2019 2358   Sepsis Labs Invalid input(s): PROCALCITONIN,  WBC,  LACTICIDVEN Microbiology Recent Results (from the past 240 hour(s))  Respiratory Panel by RT PCR (Flu A&B, Covid) - Nasopharyngeal Swab     Status: None   Collection Time: 08/27/19  5:22 PM   Specimen: Nasopharyngeal Swab  Result Value Ref Range Status   SARS Coronavirus 2 by RT PCR NEGATIVE NEGATIVE Final    Comment: (NOTE) SARS-CoV-2 target nucleic acids are NOT DETECTED. The SARS-CoV-2 RNA is generally detectable in upper respiratoy specimens during the acute phase of infection. The  lowest concentration of SARS-CoV-2 viral copies this  assay can detect is 131 copies/mL. A negative result does not preclude SARS-Cov-2 infection and should not be used as the sole basis for treatment or other patient management decisions. A negative result may occur with  improper specimen collection/handling, submission of specimen other than nasopharyngeal swab, presence of viral mutation(s) within the areas targeted by this assay, and inadequate number of viral copies (<131 copies/mL). A negative result must be combined with clinical observations, patient history, and epidemiological information. The expected result is Negative. Fact Sheet for Patients:  PinkCheek.be Fact Sheet for Healthcare Providers:  GravelBags.it This test is not yet ap proved or cleared by the [place] FDA and  has been authorized for detection and/or diagnosis of SARS-CoV-2 by FDA under an Emergency Use Authorization (EUA). This EUA will remain  in effect (meaning this test can be used) for the duration of the COVID-19 declaration under Section 564(b)(1) of the Act, 21 U.S.C. section 360bbb-3(b)(1), unless the authorization is terminated or revoked sooner.    Influenza A by PCR NEGATIVE NEGATIVE Final   Influenza B by PCR NEGATIVE NEGATIVE Final    Comment: (NOTE) The Xpert Xpress SARS-CoV-2/FLU/RSV assay is intended as an aid in  the diagnosis of influenza from Nasopharyngeal swab specimens and  should not be used as a sole basis for treatment. Nasal washings and  aspirates are unacceptable for Xpert Xpress SARS-CoV-2/FLU/RSV  testing. Fact Sheet for Patients: PinkCheek.be Fact Sheet for Healthcare Providers: GravelBags.it This test is not yet approved or cleared by the [place] FDA and  has been authorized for detection and/or diagnosis of SARS-CoV-2 by  FDA under an Emergency  Use Authorization (EUA). This EUA will remain  in effect (meaning this test can be used) for the duration of the  Covid-19 declaration under Section 564(b)(1) of the Act, 21  U.S.C. section 360bbb-3(b)(1), unless the authorization is  terminated or revoked. Performed at [facility], [address] [place], [place] [address]    Time coordinating discharge:   SIGNED:  Irwin Brakeman, MD  [facility] 08/28/2019, 2:42 PM How to contact the [facility] Attending or Consulting provider [place] or covering provider during after hours [place], for this patient?  1. Check the care team in [facility] and look for a) attending/consulting [facility] provider listed and b) the [facility] team listed 2. Log into [email or web address] and use [place]'s universal password to access. If you do not have the password, please contact the hospital operator. 3. Locate the [facility] provider you are looking for under [facility] and page to a number that you can be directly reached. 4. If you still have difficulty reaching the provider, please page the [facility] (Director on Call) for the Hospitalists listed on amion for assistance.

## 2019-08-28 NOTE — Consult Note (Signed)
Consultation Note Date: 08/28/2019   Patient Name: Patricia Hurst  DOB: 1945/07/01  MRN: [id number]  Age / Sex: 74 y.o., female  PCP: Berenice Primas Referring Physician: Murlean Iba, MD  Reason for Consultation: Establishing goals of care  HPI/Patient Profile: 74 y.o. female  with past medical history of adenocarcinoma of colon s/p right hemicolectomy 06/04/19 with two cycles of chemotherapy, anxiety, hypothyroidism, mitral valve regurgitation, hypercholesteremia  admitted on 08/27/2019 with right sided weakness and drooling. In ED, neurological exam unremarkable. No electrolyte abnormalities on CMP. CT head negative. Side effect of chemotherapy? (has received CAPEOX and FOLFOX). MRI performed 5/4 and negative for acute findings including metastatic disease. Palliative medicine consultation for goals of care.   Clinical Assessment and Goals of Care: PMT consult received, chart reviewed, discussed with care team and met with patient and son at bedside.  Patient is sitting up in chair and intermittently ambulating in the room during visit. She has tolerated some breakfast and lunch today.   Patient is anxious to hear about MRI results. Reviewed with patient that MRI is negative for acute findings including metastatic disease. Patient is thrilled and tearful to hear this, sharing that God has her back. Son at bedside shares that the patient's father and sister had brain cancer, therefore she was terrified that the cancer may have spread. Emotional support provided.    Discussed events since diagnosis of cancer in February 2021. Reviewed recommendations from Dr. Delton Coombes. Patient and son confirm plan for no further chemotherapy due to her poor response and significant symptoms following both cycles of chemotherapy. Patient and son confirm plan for lab work/CEA every 3 months and repeat CT's every 6 months  for at least two years.   Reviewed PT/OT recommendations. Patient and son are interested in home health physical therapy when discharged. Explained that I would notify Dr. Wynetta Emery. Also, patient is wondering if she will be discharged today. Defer to Dr. Wynetta Emery.   AD packet introduced and discussed. Son shares they have completed this for the patient's husband and will further review and discuss with his mother.   Answered questions about care plan.   Spoke with Dr. Wynetta Emery. Patient will likely discharge home today.    SUMMARY OF RECOMMENDATIONS    Brief initial palliative discussion. Patient relieved to hear that MRI is negative.  Continue outpatient oncology follow-up. Patient and Dr. Delton Coombes have decided no further chemotherapy at this time due to intolerance. Plan is for labs/CEA every 3 months and repeat CT scans every 6 months for first 2 years following diagnosis.  Introduced and discussed AD packet. Encouraged completion.   Patient/son interested in outpatient physical therapy f/u. Updated attending.   Discharge plan home. Likely today.   Code Status/Advance Care Planning:  Full code  Symptom Management:   Per attending  Palliative Prophylaxis:   Bowel Regimen and Frequent Pain Assessment  Additional Recommendations (Limitations, Scope, Preferences):  Full Scope Treatment  Psycho-social/Spiritual:   Desire for further Chaplaincy support: yes  Additional Recommendations: Caregiving  Support/Resources  Prognosis:   Unable to determine  Discharge Planning: Home with home health     Primary Diagnoses: Present on Admission: . Adenocarcinoma, colon ([place])   I have reviewed the medical record, interviewed the patient and family, and examined the patient. The following aspects are pertinent.  Past Medical History:  Diagnosis Date  . Anxiety   . Cancer ([place])    colon  . Fatigue   . Glaucoma   . Hypercholesteremia   . Hypothyroidism   . Mitral  valve regurgitation   . Ovarian failure, iatrogenic   . PONV (postoperative nausea and vomiting)   . Port-A-Cath in place 07/13/2019  . Vitamin D deficiency    Social History   Socioeconomic History  . Marital status: Married    Spouse name: Not on file  . Number of children: 4  . Years of education: Not on file  . Highest education level: Not on file  Occupational History  . Not on file  Tobacco Use  . Smoking status: Never Smoker  . Smokeless tobacco: Never Used  Substance and Sexual Activity  . Alcohol use: Not Currently  . Drug use: Never  . Sexual activity: Not Currently  Other Topics Concern  . Not on file  Social History Narrative  . Not on file   Social Determinants of Health   Financial Resource Strain: Medium Risk  . Difficulty of Paying Living Expenses: Somewhat hard  Food Insecurity: No Food Insecurity  . Worried About Charity fundraiser in the Last Year: Never true  . Ran Out of Food in the Last Year: Never true  Transportation Needs: No Transportation Needs  . Lack of Transportation (Medical): No  . Lack of Transportation (Non-Medical): No  Physical Activity: Insufficiently Active  . Days of Exercise per Week: 3 days  . Minutes of Exercise per Session: 20 min  Stress: No Stress Concern Present  . Feeling of Stress : Not at all  Social Connections: Slightly Isolated  . Frequency of Communication with Friends and Family: More than three times a week  . Frequency of Social Gatherings with Friends and Family: More than three times a week  . Attends Religious Services: More than 4 times per year  . Active Member of Clubs or Organizations: No  . Attends Archivist Meetings: Never  . Marital Status: Married   Family History  Problem Relation Age of Onset  . Kidney disease Mother   . Hypertension Mother   . Brain cancer Father   . Cancer Sister   . Diabetes Sister   . Breast cancer Sister   . COPD Sister   . COPD Sister   . Diabetes Sister    . Cancer Sister   . Cystic fibrosis Brother   . Diabetes Brother   . COPD Brother   . Diabetes Son   . Thyroid disease Daughter    Scheduled Meds: . Chlorhexidine Gluconate Cloth  6 each Topical Daily  . feeding supplement (ENSURE ENLIVE)  237 mL Oral BID BM  . levothyroxine  25 mcg Oral QAC breakfast  . loratadine  10 mg Oral Daily  . timolol  1 drop Both Eyes Daily   Continuous Infusions: PRN Meds:.acetaminophen **OR** acetaminophen, polyethylene glycol, traZODone Medications Prior to Admission:  Prior to Admission medications   Medication Sig Start Date End Date Taking? Authorizing Provider  acetaminophen (TYLENOL) 500 MG tablet Take 500 mg by mouth See admin instructions. Take 500 mg at night, may take a second 500  mg dose as needed for pain   Yes [provider]  ALPRAZolam (XANAX) 0.25 MG tablet Take 0.5 tablets (0.125 mg total) by mouth 2 (two) times daily as needed for anxiety. 08/24/19  Yes Lockamy, Randi L, NP-C  alteplase (CATHFLO ACTIVASE) 2 MG injection 2 mg by Intracatheter route once as needed for open catheter.   Yes [provider]  aspirin EC 81 MG tablet Take 1 tablet (81 mg total) by mouth daily. 05/18/19  Yes Rehman, Mechele Dawley, MD  Calcium Carb-Cholecalciferol (CALCIUM 500 + D3 PO) Take 1 tablet by mouth at bedtime.    Yes [provider]  filgrastim (NEUPOGEN) 480 MCG/1.6ML injection Inject 480 mcg into the skin once.   Yes [provider]  Homeopathic Products ([place] EX) Apply 1 application topically daily as needed (pain).   Yes [provider]  levothyroxine (SYNTHROID) 25 MCG tablet Take 25 mcg by mouth daily before breakfast.   Yes [provider]  loperamide (IMODIUM A-D) 2 MG tablet Take 2-4 mg by mouth 4 (four) times daily as needed for diarrhea or loose stools.   Yes [provider]  loratadine (CLARITIN) 10 MG tablet Take 10 mg by mouth daily.   Yes [provider]  Menthol,  Topical Analgesic, (BLUE-EMU MAXIMUM STRENGTH EX) Apply 1 application topically daily as needed (joint pain).   Yes [provider]  Multiple Vitamin (MULTIVITAMIN WITH MINERALS) TABS tablet Take 1 tablet by mouth at bedtime.   Yes [provider]  oxaliplatin in dextrose 5 % 500 mL Inject 130 mg/m2 into the vein every 21 ( twenty-one) days.  07/19/19  Yes [provider]  prochlorperazine (COMPAZINE) 10 MG tablet Take 10 mg by mouth every 6 (six) hours as needed for nausea or vomiting.   Yes [provider]  psyllium (METAMUCIL) 58.6 % powder Take 1 packet by mouth daily as needed (for constipation). Mixed with OJ    Yes [provider]  timolol (TIMOPTIC) 0.5 % ophthalmic solution Place 1 drop into both eyes daily. 02/05/19  Yes [provider]  ondansetron (ZOFRAN) 4 MG tablet Take 1 tablet (4 mg total) by mouth every 8 (eight) hours as needed for nausea or vomiting. Patient not taking: Reported on 08/27/2019 07/24/19   Lockamy, Randi L, NP-C  XELODA 500 MG tablet Take 3 tablets (1,500 mg total) by mouth 2 (two) times daily after a meal. Take for 14 days, then hold for 7 days. Repeat every 21 days. Patient not taking: Reported on 08/22/2019 07/12/19   Derek Jack, MD   Allergies  Allergen Reactions  . Morphine Sulfate Rash   Review of Systems  Constitutional: Positive for activity change, appetite change and unexpected weight change.  Neurological: Positive for weakness.   Physical Exam Vitals and nursing note reviewed.  Constitutional:      General: She is awake.  HENT:     Head: Normocephalic and atraumatic.  Pulmonary:     Effort: No tachypnea, accessory muscle usage or respiratory distress.  Skin:    General: Skin is warm and dry.  Neurological:     Mental Status: She is alert and oriented to person, place, and time.  Psychiatric:        Mood and Affect: Mood normal.        Speech: Speech normal.        Behavior:  Behavior normal.        Cognition and Memory: Cognition normal.    Vital Signs:  BP 116/60 (BP Location: Left Arm)   Pulse 78   Temp 98.3 F (36.8 C) (Oral)   Resp 18   Ht 5' 2"  (1.575 m)   Wt 57 kg   SpO2 100%   BMI 22.98 kg/m  Pain Scale: 0-10   Pain Score: 0-No pain   SpO2: SpO2: 100 % O2 Device:SpO2: 100 % O2 Flow Rate: .   IO: Intake/output summary:   Intake/Output Summary (Last 24 hours) at 08/28/2019 1428 Last data filed at 08/28/2019 1300 Gross per 24 hour  Intake 1720 ml  Output --  Net 1720 ml    LBM: Last BM Date: 08/27/19 Baseline Weight: Weight: 57.2 kg Most recent weight: Weight: 57 kg     Palliative Assessment/Data: PPS 60%     Time In: 1400 Time Out: 1440 Time Total: 68mn Greater than 50%  of this time was spent counseling and coordinating care related to the above assessment and plan.  Signed by:  MIhor Dow DNP, FNP-C Palliative Medicine Team  Phone: [phone]: [phone]  Please contact Palliative Medicine Team phone at [phone] questions and concerns.  For individual provider: See AShea Evans

## 2019-08-28 NOTE — Evaluation (Signed)
Occupational Therapy Evaluation Patient Details Name: Patricia Hurst MRN: [id number] DOB: [birth year] Today's Date: 08/28/2019    History of Present Illness Patricia Hurst is a 74 y.o. female with hx of recent diagnosis of adenocarcinoma of the colon s/p R hemicolectomy and two cycles of chemo no longer receiving treatment due to side effects, who presents for R sided weakness.   Clinical Impression   Pt agreeable to OT evaluation, PT joined shortly after session began. Pt reports right side feels "different" however sensation is intact and strength is generally weak in BUE. Pt reports it is difficult to write, however wrote her name and the word hamburgers on paper with no difficulty and 100% legibility. Pt performing ADLs independently, supervision for safety with functional mobility. Pt appears at baseline for ADL completion, no further OT services required at this time.     Follow Up Recommendations  No OT follow up    Equipment Recommendations  Tub/shower seat       Precautions / Restrictions Precautions Precautions: None Restrictions Weight Bearing Restrictions: No      Mobility Bed Mobility Overal bed mobility: Independent                Transfers Overall transfer level: Modified independent Equipment used: None                      ADL either performed or assessed with clinical judgement   ADL Overall ADL's : Needs assistance/impaired     Grooming: Wash/dry hands;Modified independent;Standing                   Toilet Transfer: Modified Independent;Ambulation   Toileting- Clothing Manipulation and Hygiene: Modified independent;Sitting/lateral lean;Sit to/from stand       Functional mobility during ADLs: Supervision/safety       Vision Baseline Vision/History: No visual deficits Patient Visual Report: No change from baseline Vision Assessment?: No apparent visual deficits            Pertinent Vitals/Pain Pain Assessment:  No/denies pain     Hand Dominance Right   Extremity/Trunk Assessment Upper Extremity Assessment Upper Extremity Assessment: Generalized weakness   Lower Extremity Assessment Lower Extremity Assessment: Defer to PT evaluation   Cervical / Trunk Assessment Cervical / Trunk Assessment: Normal   Communication Communication Communication: No difficulties   Cognition Arousal/Alertness: Awake/alert Behavior During Therapy: WFL for tasks assessed/performed Overall Cognitive Status: Within Functional Limits for tasks assessed                                                Home Living Family/patient expects to be discharged to:: Private residence Living Arrangements: Spouse/significant other Available Help at Discharge: Family;Available PRN/intermittently Type of Home: House Home Access: Stairs to enter [employer] of Steps: 6 Entrance Stairs-Rails: Right;Left;Can reach both Home Layout: One level     Bathroom Shower/Tub: Teacher, early years/pre: Standard     Home Equipment: None   Additional Comments: husband is currently hospitalized [facility]       Prior Functioning/Environment Level of Independence: Independent        Comments: Independent in ADLs drives        OT Problem List: Decreased activity tolerance  Co-evaluation PT/OT/SLP Co-Evaluation/Treatment: Yes     OT goals addressed during session: ADL's and self-care      AM-PAC OT "6 Clicks" Daily Activity     Outcome Measure Help from another person eating meals?: None Help from another person taking care of personal grooming?: None Help from another person toileting, which includes using toliet, bedpan, or urinal?: None Help from another person bathing (including washing, rinsing, drying)?: None Help from another person to put on and taking off regular upper body clothing?: None Help from another person to put on and taking off  regular lower body clothing?: None 6 Click Score: 24   End of Session Nurse Communication: Mobility status  Activity Tolerance: Patient tolerated treatment well Patient left: in chair;with call bell/phone within reach  OT Visit Diagnosis: Muscle weakness (generalized) (M62.81)                Time: [id number] OT Time Calculation (min): 18 min Charges:  OT General Charges $OT Visit: 1 Visit OT Evaluation $OT Eval Low Complexity: 1 Low   Guadelupe Sabin, OTR/L  [phone] 08/28/2019, 8:54 AM

## 2019-08-28 NOTE — Plan of Care (Signed)

## 2019-08-28 NOTE — Discharge Instructions (Signed)
Weakness Weakness is a lack of strength. You may feel weak all over your body (generalized), or you may feel weak in one specific part of your body (focal). Common causes of weakness include:  Infection and immune system disorders.  Physical exhaustion.  Internal bleeding or other blood loss that results in a lack of red blood cells (anemia).  Dehydration.  An imbalance in mineral (electrolyte) levels, such as potassium.  Heart disease, circulation problems, or stroke. Other causes include:  Some medicines or cancer treatment.  Stress, anxiety, or depression.  Nervous system disorders.  Thyroid disorders.  Loss of muscle strength because of age or inactivity.  Poor sleep quality or sleep disorders. The cause of your weakness may not be known. Some causes of weakness can be serious, so it is important to see your health care provider. Follow these instructions at home: Activity  Rest as needed.  Try to get enough sleep. Most adults need 7-8 hours of quality sleep each night. Talk to your health care provider about how much sleep you need each night.  Do exercises, such as arm curls and leg raises, for 30 minutes at least 2 days a week or as told by your health care provider. This helps build muscle strength.  Consider working with a physical therapist or trainer who can develop an exercise plan to help you gain muscle strength. General instructions   Take over-the-counter and prescription medicines only as told by your health care provider.  Eat a healthy, well-balanced diet. This includes: ? Proteins to build muscles, such as lean meats and fish. ? Fresh fruits and vegetables. ? Carbohydrates to boost energy, such as whole grains.  Drink enough fluid to keep your urine pale yellow.  Keep all follow-up visits as told by your health care provider. This is important. Contact a health care provider if your weakness:  Does not improve or gets worse.  Affects your  ability to think clearly.  Affects your ability to do your normal daily activities. Get help right away if you:  Develop sudden weakness, especially on one side of your face or body.  Have chest pain.  Have trouble breathing or shortness of breath.  Have problems with your vision.  Have trouble talking or swallowing.  Have trouble standing or walking.  Are light-headed or lose consciousness. Summary  Weakness is a lack of strength. You may feel weak all over your body or just in one specific part of your body.  Weakness can be caused by a variety of things. In some cases, the cause may be unknown.  Rest as needed, and try to get enough sleep. Most adults need 7-8 hours of quality sleep each night.  Eat a healthy, well-balanced diet. This information is not intended to replace advice given to you by your health care provider. Make sure you discuss any questions you have with your health care provider. Document Revised: 11/16/2017 Document Reviewed: 11/16/2017 Elsevier Patient Education  [place] refers to the changes in the body that occur during a period of inactivity. The changes happen in the heart, lungs, and muscles. They make you feel tired and weak (fatigued) and decrease your ability to be active. The three stages of deconditioning include:  Mild deconditioning. This is a change in your ability to do your usual exercise activities, such as running, biking, or swimming.  Moderate deconditioning. This is a change in your ability to do normal everyday activities, such as walking, shopping for  groceries, and doing chores.  Severe deconditioning. In this stage, you may not be able to do minimal activity or usual self-care. What are the causes? Deconditioning can occur after only a few days of inactivity. The longer the period of inactivity, the more severe the deconditioning will be, and the longer it will take to return to your  previous level of functioning. Deconditioning is often caused by inactivity due to:  Illnesses, such as cancer, stroke, heart attack, fibromyalgia, and chronic fatigue syndrome.  Injuries, especially back injuries, broken bones, and injuries to soft tissues, such as ligaments and tendons.  A long stay in the hospital.  Pregnancy, especially if long periods of bed rest are needed. What increases the risk? The following factors may make you more likely to develop this condition:  Staying in the hospital or being on bed rest.  Obesity.  Poor nutrition.  Being an older adult.  Having an injury or illness that affects your movement and activity. What are the signs or symptoms? Symptoms of this condition include:  Weakness and tiredness.  Shortness of breath with minor physical effort (exertion).  A heartbeat that is faster than normal. You may not notice this without taking your pulse.  Pain or discomfort with activity.  Decreased strength, endurance, and balance.  Difficulty doing your usual forms of exercise.  Difficulty doing activities of daily living, such as grocery shopping or chores. You may also have problems walking around the house and doing basic self-care, such as getting to the bathroom, preparing meals, or doing laundry. How is this diagnosed? This condition is diagnosed based on your medical history and a physical exam. During the physical exam, your health care provider will check for signs of deconditioning, such as:  Decreased size of muscles.  Decreased strength.  Trouble with balance.  Shortness of breath or a heart rate that is faster than normal after minor exertion. How is this treated? Treatment for this condition involves an exercise program in which activity is increased slowly. Your health care provider will tell you which exercises are right for you. The exercise program will likely include:  Aerobic exercise. This type of exercise helps  improve the functioning of the heart, lungs, and muscles.  Strength training. This type of exercise helps increase muscle size and strength. Both of these types of exercise will improve your endurance. You may be referred to a physical therapist who can create a safe strengthening program for you to follow. Follow these instructions at home: Eating and drinking   Eat a healthy, well-balanced diet. This includes: ? Proteins, such as lean meats and fish, to build muscles. ? Fresh fruits and vegetables. ? Carbohydrates, such as whole grains, to boost energy.  Drink enough fluid to keep your urine pale yellow. Activity   Follow the exercise program that is recommended by your health care provider or physical therapist.  Do not increase your exercise any faster than directed. General instructions  Take over-the-counter and prescription medicines only as told by your health care provider.  Do not use any products that contain nicotine or tobacco, such as cigarettes, e-cigarettes, and chewing tobacco. If you need help quitting, ask your health care provider.  Keep all follow-up visits as told by your health care provider. This is important. Contact a health care provider if:  You are not able to do the recommended exercise program.  You are becoming more and more tired and weak.  You become light-headed when rising to a sitting or  standing position.  Your level of endurance decreases after it has improved. Get help right away if you:  Have chest pain.  Are very short of breath.  Have any episodes of fainting. Summary  Deconditioning refers to the changes in the body that occur during a period of inactivity.  Deconditioning happens in the heart, lungs, and muscles. The changes make you feel tired and weak and decrease your ability to be active.  Treatment for deconditioning involves an exercise program in which activity is increased slowly. This information is not intended  to replace advice given to you by your health care provider. Make sure you discuss any questions you have with your health care provider. Document Revised: 09/07/2018 Document Reviewed: 09/07/2018 Elsevier Patient Education  [place].   IMPORTANT INFORMATION: PAY CLOSE ATTENTION   PHYSICIAN DISCHARGE INSTRUCTIONS  Follow with Primary care provider  Berenice Primas  and other consultants as instructed by your Hospitalist Physician  [place] [facility] IF SYMPTOMS COME BACK, WORSEN OR NEW PROBLEM DEVELOPS   Please note: You were cared for by a hospitalist during your hospital stay. Every effort will be made to forward records to your primary care provider.  You can request that your primary care provider send for your hospital records if they have not received them.  Once you are discharged, your primary care physician will handle any further medical issues. Please note that NO REFILLS for any discharge medications will be authorized once you are discharged, as it is imperative that you return to your primary care physician (or establish a relationship with a primary care physician if you do not have one) for your post hospital discharge needs so that they can reassess your need for medications and monitor your lab values.  Please get a complete blood count and chemistry panel checked by your Primary MD at your next visit, and again as instructed by your Primary MD.  Get Medicines reviewed and adjusted: Please take all your medications with you for your next visit with your Primary MD  Laboratory/radiological data: Please request your Primary MD to go over all hospital tests and procedure/radiological results at the follow up, please ask your primary care provider to get all Hospital records sent to his/her office.  In some cases, they will be blood work, cultures and biopsy results pending at the time of your discharge. Please request that your primary  care provider follow up on these results.  If you are diabetic, please bring your blood sugar readings with you to your follow up appointment with primary care.    Please call and make your follow up appointments as soon as possible.    Also Note the following: If you experience worsening of your admission symptoms, develop shortness of breath, life threatening emergency, suicidal or homicidal thoughts you must seek medical attention immediately by calling 911 or calling your MD immediately  if symptoms less severe.  You must read complete instructions/literature along with all the possible adverse reactions/side effects for all the Medicines you take and that have been prescribed to you. Take any new Medicines after you have completely understood and accpet all the possible adverse reactions/side effects.   Do not drive when taking Pain medications or sleeping medications (Benzodiazepines)  Do not take more than prescribed Pain, Sleep and Anxiety Medications. It is not advisable to combine anxiety,sleep and pain medications without talking with your primary care practitioner  Special Instructions: If you have smoked or  chewed Tobacco  in the last 2 yrs please stop smoking, stop any regular Alcohol  and or any Recreational drug use.  Wear Seat belts while driving.  Do not drive if taking any narcotic, mind altering or controlled substances or recreational drugs or alcohol.

## 2019-08-28 NOTE — Care Management Obs Status (Signed)
[facility] NOTIFICATION   Patient Details  Name: Patricia Hurst MRN: [id number] Date of Birth: [birth year]   Medicare Observation Status Notification Given:  Yes    Tommy Medal 08/28/2019, 1:53 PM

## 2019-08-28 NOTE — Plan of Care (Signed)
  Problem: Acute Rehab PT Goals(only PT should resolve) Goal: Pt Will Ambulate Outcome: Progressing Flowsheets (Taken 08/28/2019 1054) Pt will Ambulate:  > 125 feet  with modified independence  with least restrictive assistive device Goal: Pt Will Go Up/Down Stairs Outcome: Progressing Flowsheets (Taken 08/28/2019 1054) Pt will Go Up / Down Stairs:  6-9 stairs  with modified independence  with rail(s)   Talbot Grumbling PT, DPT 08/28/19, 10:55 AM [phone]

## 2019-08-29 ENCOUNTER — Encounter (HOSPITAL_COMMUNITY): Payer: Self-pay | Admitting: Hematology

## 2019-08-29 ENCOUNTER — Other Ambulatory Visit: Payer: Self-pay

## 2019-08-29 ENCOUNTER — Inpatient Hospital Stay (HOSPITAL_COMMUNITY): Payer: Medicare HMO | Attending: Hematology | Admitting: Hematology

## 2019-08-29 ENCOUNTER — Inpatient Hospital Stay (HOSPITAL_COMMUNITY): Payer: Medicare HMO

## 2019-08-29 VITALS — BP 152/72 | HR 82 | Temp 96.4°F | Resp 18 | Wt 123.7 lb

## 2019-08-29 DIAGNOSIS — Z79899 Other long term (current) drug therapy: Secondary | ICD-10-CM | POA: Diagnosis not present

## 2019-08-29 DIAGNOSIS — R531 Weakness: Secondary | ICD-10-CM | POA: Insufficient documentation

## 2019-08-29 DIAGNOSIS — R110 Nausea: Secondary | ICD-10-CM | POA: Diagnosis not present

## 2019-08-29 DIAGNOSIS — Z803 Family history of malignant neoplasm of breast: Secondary | ICD-10-CM | POA: Diagnosis not present

## 2019-08-29 DIAGNOSIS — C183 Malignant neoplasm of hepatic flexure: Secondary | ICD-10-CM | POA: Diagnosis not present

## 2019-08-29 DIAGNOSIS — C182 Malignant neoplasm of ascending colon: Secondary | ICD-10-CM

## 2019-08-29 DIAGNOSIS — Z809 Family history of malignant neoplasm, unspecified: Secondary | ICD-10-CM | POA: Diagnosis not present

## 2019-08-29 DIAGNOSIS — Z808 Family history of malignant neoplasm of other organs or systems: Secondary | ICD-10-CM | POA: Insufficient documentation

## 2019-08-29 DIAGNOSIS — F419 Anxiety disorder, unspecified: Secondary | ICD-10-CM | POA: Insufficient documentation

## 2019-08-29 DIAGNOSIS — R634 Abnormal weight loss: Secondary | ICD-10-CM | POA: Insufficient documentation

## 2019-08-29 DIAGNOSIS — Z9221 Personal history of antineoplastic chemotherapy: Secondary | ICD-10-CM | POA: Diagnosis not present

## 2019-08-29 DIAGNOSIS — K117 Disturbances of salivary secretion: Secondary | ICD-10-CM | POA: Diagnosis not present

## 2019-08-29 LAB — CBC WITH DIFFERENTIAL/PLATELET
Abs Immature Granulocytes: 0 10*3/uL (ref 0.00–0.07)
Basophils Absolute: 0 10*3/uL (ref 0.0–0.1)
Basophils Relative: 1 %
Eosinophils Absolute: 0.2 10*3/uL (ref 0.0–0.5)
Eosinophils Relative: 5 %
HCT: 33.3 % — ABNORMAL LOW (ref 36.0–46.0)
Hemoglobin: 11.5 g/dL — ABNORMAL LOW (ref 12.0–15.0)
Immature Granulocytes: 0 %
Lymphocytes Relative: 35 %
Lymphs Abs: 1.4 10*3/uL (ref 0.7–4.0)
MCH: 30.4 pg (ref 26.0–34.0)
MCHC: 34.5 g/dL (ref 30.0–36.0)
MCV: 88.1 fL (ref 80.0–100.0)
Monocytes Absolute: 0.5 10*3/uL (ref 0.1–1.0)
Monocytes Relative: 13 %
Neutro Abs: 1.9 10*3/uL (ref 1.7–7.7)
Neutrophils Relative %: 46 %
Platelets: 126 10*3/uL — ABNORMAL LOW (ref 150–400)
RBC: 3.78 MIL/uL — ABNORMAL LOW (ref 3.87–5.11)
RDW: 14.1 % (ref 11.5–15.5)
WBC: 4.1 10*3/uL (ref 4.0–10.5)
nRBC: 0 % (ref 0.0–0.2)

## 2019-08-29 LAB — COMPREHENSIVE METABOLIC PANEL
ALT: 31 U/L (ref 0–44)
AST: 39 U/L (ref 15–41)
Albumin: 4.2 g/dL (ref 3.5–5.0)
Alkaline Phosphatase: 57 U/L (ref 38–126)
Anion gap: 5 (ref 5–15)
BUN: 6 mg/dL — ABNORMAL LOW (ref 8–23)
CO2: 26 mmol/L (ref 22–32)
Calcium: 8.9 mg/dL (ref 8.9–10.3)
Chloride: 106 mmol/L (ref 98–111)
Creatinine, Ser: 0.83 mg/dL (ref 0.44–1.00)
GFR calc Af Amer: >60 mL/min (ref >60)
GFR calc non Af Amer: >60 mL/min (ref >60)
Glucose, Bld: 121 mg/dL — ABNORMAL HIGH (ref 70–99)
Potassium: 3.9 mmol/L (ref 3.5–5.1)
Sodium: 137 mmol/L (ref 135–145)
Total Bilirubin: 1.2 mg/dL (ref 0.3–1.2)
Total Protein: 6.8 g/dL (ref 6.5–8.1)

## 2019-08-29 LAB — MAGNESIUM: Magnesium: 1.6 mg/dL — ABNORMAL LOW (ref 1.7–2.4)

## 2019-08-29 MED ORDER — SCOPOLAMINE 1 MG/3DAYS TD PT72
1.0000 | MEDICATED_PATCH | Freq: Once | TRANSDERMAL | Status: DC
  Administered 2019-08-29: 1.5 mg via TRANSDERMAL
  Filled 2019-08-29: qty 1

## 2019-08-29 MED ORDER — MAGNESIUM OXIDE 400 (241.3 MG) MG PO TABS
400.0000 mg | ORAL_TABLET | Freq: Every day | ORAL | 0 refills | Status: DC
Start: 2019-08-29 — End: 2019-11-13

## 2019-08-29 NOTE — Assessment & Plan Note (Signed)
1.  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 -First cycle of [place] ox on 07/19/2019, poorly tolerated.  First cycle of FOLFOX on 08/15/2019, 20% dose reduced, poorly tolerated. -Hospitalized on 08/27/2019 through 08/28/2019 with right upper extremity weakness. -MRI of the brain with and without contrast on 08/28/2019 did not show any evidence of metastatic disease, or any other abnormalities. -Because of her poor tolerance, we have decided not to continue any further adjuvant chemotherapy. -We have reviewed her labs.  LFTs are normal.  We will plan to do close follow-ups with [facility] every 3 months and CT scans every 6 months during first 2 years.  I plan to repeat CT scan in July.  2.  Hypomagnesemia: -Magnesium today is 1.6.  We will start her on magnesium 400 mEq daily.  3.  Nausea/hypersalivation: -She reports hypersalivation associated with nausea.  We will start her on scopolamine patch.  4.  Anxiety: -She was told to take Xanax 0.25 mg half tablet twice daily as needed.  5.  Right upper extremity weakness: -Muscle strength assessed by me today was normal in both upper extremities.  However she has trouble reaching to the back of her head with the right hand.  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  It will highly likely improve in the next couple of weeks.  I will reevaluate her in 2 weeks.  No other etiology was found on brain MRI.  6.  Weight loss: -She lost about 7 pounds since last treatment.  She was encouraged to try different type of foods.
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
## 2019-08-29 NOTE — Patient Instructions (Signed)
[place] at [facility] Discharge Instructions  You were seen today by Dr. Delton Coombes. He went over your recent lab results. He will send in a new prescription for magnesium to your pharmacy. Start taking a whole tablet of the Xanax to help with your nervousness. He will see you back in 2 weeks for labs and follow up.   Thank you for choosing [place] at [facility] to provide your oncology and hematology care.  To afford each patient quality time with our provider, please arrive at least 15 minutes before your scheduled appointment time.   If you have a lab appointment with the [facility] please come in thru the  Main Entrance and check in at the main information desk  You need to re-schedule your appointment should you arrive 10 or more minutes late.  We strive to give you quality time with our providers, and arriving late affects you and other patients whose appointments are after yours.  Also, if you no show three or more times for appointments you may be dismissed from the clinic at the providers discretion.     Again, thank you for choosing [facility].  Our hope is that these requests will decrease the amount of time that you wait before being seen by our physicians.       _____________________________________________________________  Should you have questions after your visit to [facility], please contact our office at [phone] between the hours of 8:00 a.m. and 4:30 p.m.  Voicemails left after 4:00 p.m. will not be returned until the following business day.  For prescription refill requests, have your pharmacy contact our office and allow 72 hours.    Cancer Center Support Programs:   > Cancer Support Group  2nd Tuesday of the month 1pm-2pm, Journey Room

## 2019-09-03 ENCOUNTER — Encounter (HOSPITAL_COMMUNITY): Payer: Self-pay | Admitting: *Deleted

## 2019-09-03 ENCOUNTER — Other Ambulatory Visit (HOSPITAL_COMMUNITY): Payer: Self-pay | Admitting: *Deleted

## 2019-09-03 MED ORDER — SCOPOLAMINE 1 MG/3DAYS TD PT72: 1.0000 | MEDICATED_PATCH | TRANSDERMAL | 12 refills | Status: DC

## 2019-09-03 NOTE — Progress Notes (Signed)
Patient called clinic and left a VM advising that she was feeling better. Nausea is controlled with the scopolamine patches. She wanted to let us know that she is doing better now that she is feeling less nausea.

## 2019-09-04 ENCOUNTER — Other Ambulatory Visit (HOSPITAL_COMMUNITY): Payer: Medicare HMO

## 2019-09-04 ENCOUNTER — Ambulatory Visit (HOSPITAL_COMMUNITY): Payer: Medicare HMO | Admitting: Hematology

## 2019-09-04 ENCOUNTER — Ambulatory Visit (HOSPITAL_COMMUNITY): Payer: Medicare HMO

## 2019-09-04 DIAGNOSIS — Z09 Encounter for follow-up examination after completed treatment for conditions other than malignant neoplasm: Secondary | ICD-10-CM | POA: Diagnosis not present

## 2019-09-04 DIAGNOSIS — E7800 Pure hypercholesterolemia, unspecified: Secondary | ICD-10-CM | POA: Diagnosis not present

## 2019-09-04 DIAGNOSIS — C189 Malignant neoplasm of colon, unspecified: Secondary | ICD-10-CM | POA: Diagnosis not present

## 2019-09-04 DIAGNOSIS — E039 Hypothyroidism, unspecified: Secondary | ICD-10-CM | POA: Diagnosis not present

## 2019-09-04 DIAGNOSIS — Z299 Encounter for prophylactic measures, unspecified: Secondary | ICD-10-CM | POA: Diagnosis not present

## 2019-09-06 ENCOUNTER — Encounter (HOSPITAL_COMMUNITY): Payer: Medicare HMO

## 2019-09-12 ENCOUNTER — Encounter (HOSPITAL_COMMUNITY): Payer: Self-pay | Admitting: Hematology

## 2019-09-12 ENCOUNTER — Inpatient Hospital Stay (HOSPITAL_COMMUNITY): Payer: Medicare HMO

## 2019-09-12 ENCOUNTER — Inpatient Hospital Stay (HOSPITAL_COMMUNITY): Payer: Medicare HMO | Admitting: Hematology

## 2019-09-12 ENCOUNTER — Other Ambulatory Visit: Payer: Self-pay

## 2019-09-12 VITALS — BP 144/78 | HR 79 | Temp 96.8°F | Resp 16 | Wt 122.2 lb

## 2019-09-12 DIAGNOSIS — C183 Malignant neoplasm of hepatic flexure: Secondary | ICD-10-CM | POA: Diagnosis not present

## 2019-09-12 DIAGNOSIS — Z808 Family history of malignant neoplasm of other organs or systems: Secondary | ICD-10-CM | POA: Diagnosis not present

## 2019-09-12 DIAGNOSIS — L659 Nonscarring hair loss, unspecified: Secondary | ICD-10-CM

## 2019-09-12 DIAGNOSIS — F419 Anxiety disorder, unspecified: Secondary | ICD-10-CM | POA: Diagnosis not present

## 2019-09-12 DIAGNOSIS — R110 Nausea: Secondary | ICD-10-CM | POA: Diagnosis not present

## 2019-09-12 DIAGNOSIS — Z803 Family history of malignant neoplasm of breast: Secondary | ICD-10-CM | POA: Diagnosis not present

## 2019-09-12 DIAGNOSIS — Z9221 Personal history of antineoplastic chemotherapy: Secondary | ICD-10-CM | POA: Diagnosis not present

## 2019-09-12 DIAGNOSIS — K117 Disturbances of salivary secretion: Secondary | ICD-10-CM | POA: Diagnosis not present

## 2019-09-12 DIAGNOSIS — C182 Malignant neoplasm of ascending colon: Secondary | ICD-10-CM

## 2019-09-12 DIAGNOSIS — Z809 Family history of malignant neoplasm, unspecified: Secondary | ICD-10-CM | POA: Diagnosis not present

## 2019-09-12 LAB — CBC WITH DIFFERENTIAL/PLATELET
Abs Immature Granulocytes: 0.01 10*3/uL (ref 0.00–0.07)
Basophils Absolute: 0.1 10*3/uL (ref 0.0–0.1)
Basophils Relative: 1 %
Eosinophils Absolute: 0.1 10*3/uL (ref 0.0–0.5)
Eosinophils Relative: 2 %
HCT: 34.2 % — ABNORMAL LOW (ref 36.0–46.0)
Hemoglobin: 11.7 g/dL — ABNORMAL LOW (ref 12.0–15.0)
Immature Granulocytes: 0 %
Lymphocytes Relative: 31 %
Lymphs Abs: 1.6 10*3/uL (ref 0.7–4.0)
MCH: 30.6 pg (ref 26.0–34.0)
MCHC: 34.2 g/dL (ref 30.0–36.0)
MCV: 89.5 fL (ref 80.0–100.0)
Monocytes Absolute: 0.6 10*3/uL (ref 0.1–1.0)
Monocytes Relative: 11 %
Neutro Abs: 3 10*3/uL (ref 1.7–7.7)
Neutrophils Relative %: 55 %
Platelets: 181 10*3/uL (ref 150–400)
RBC: 3.82 MIL/uL — ABNORMAL LOW (ref 3.87–5.11)
RDW: 14.1 % (ref 11.5–15.5)
WBC: 5.3 10*3/uL (ref 4.0–10.5)
nRBC: 0 % (ref 0.0–0.2)

## 2019-09-12 LAB — COMPREHENSIVE METABOLIC PANEL
ALT: 20 U/L (ref 0–44)
AST: 30 U/L (ref 15–41)
Albumin: 4.1 g/dL (ref 3.5–5.0)
Alkaline Phosphatase: 47 U/L (ref 38–126)
Anion gap: 10 (ref 5–15)
BUN: 9 mg/dL (ref 8–23)
CO2: 24 mmol/L (ref 22–32)
Calcium: 9.5 mg/dL (ref 8.9–10.3)
Chloride: 103 mmol/L (ref 98–111)
Creatinine, Ser: 0.84 mg/dL (ref 0.44–1.00)
GFR calc Af Amer: >60 mL/min (ref >60)
GFR calc non Af Amer: >60 mL/min (ref >60)
Glucose, Bld: 104 mg/dL — ABNORMAL HIGH (ref 70–99)
Potassium: 4.1 mmol/L (ref 3.5–5.1)
Sodium: 137 mmol/L (ref 135–145)
Total Bilirubin: 0.9 mg/dL (ref 0.3–1.2)
Total Protein: 6.6 g/dL (ref 6.5–8.1)

## 2019-09-12 LAB — TSH: TSH: 2.603 u[IU]/mL (ref 0.350–4.500)

## 2019-09-12 LAB — MAGNESIUM: Magnesium: 1.9 mg/dL (ref 1.7–2.4)

## 2019-09-12 NOTE — Patient Instructions (Signed)
[place] at [facility] Discharge Instructions  You were seen today by Dr. Delton Coombes. He went over your recent results. He encourages you to eat whatever you can at this time. At this time, he feels like her hair loss may be a combination between family stress, malnutrition, and the stress of medical issues. He will see you back in for labs and follow up.   Thank you for choosing [place] at [facility] to provide your oncology and hematology care.  To afford each patient quality time with our provider, please arrive at least 15 minutes before your scheduled appointment time.   If you have a lab appointment with the [facility] please come in thru the  Main Entrance and check in at the main information desk  You need to re-schedule your appointment should you arrive 10 or more minutes late.  We strive to give you quality time with our providers, and arriving late affects you and other patients whose appointments are after yours.  Also, if you no show three or more times for appointments you may be dismissed from the clinic at the providers discretion.     Again, thank you for choosing [facility].  Our hope is that these requests will decrease the amount of time that you wait before being seen by our physicians.       _____________________________________________________________  Should you have questions after your visit to [facility], please contact our office at [phone] between the hours of 8:00 a.m. and 4:30 p.m.  Voicemails left after 4:00 p.m. will not be returned until the following business day.  For prescription refill requests, have your pharmacy contact our office and allow 72 hours.    Cancer Center Support Programs:   > Cancer Support Group  2nd Tuesday of the month 1pm-2pm, Journey Room
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
## 2019-09-12 NOTE — Assessment & Plan Note (Addendum)
1.  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 -First cycle of [place] ox on 07/19/2019, poorly tolerated.  First cycle of FOLFOX on 08/15/2019 dose reduced, poorly tolerated. -Hospitalized on 08/27/2019 for right upper extremity weakness.  MRI of the brain on 08/28/2019 did not show any evidence of metastatic disease or any other abnormalities. -She has reported improvement in her right hand weakness in the last couple of weeks.  Energy levels are slowly improving but her eating has not come back to the baseline.  However she is eating better. -I have recommended follow-up in 2 months with repeat CTAP and CEA levels.  I have reviewed her labs today which are grossly within normal limits. -Surveillance visits will be every 3 months with repeat labs and every 6 months with CTAP for the first 2 years.  2.  Hypomagnesemia: -Magnesium today is 1.9.  She is taking magnesium 1 tablet daily.  She will continue the bottle and stop after that.  3.  Anxiety: -Continue Xanax 0.25 mg twice daily as needed. 

## 2019-09-14 ENCOUNTER — Telehealth (HOSPITAL_COMMUNITY): Payer: Self-pay

## 2019-09-14 NOTE — Telephone Encounter (Signed)
Nutrition  Patient identified on Malnutrition Screening tool for weight loss and poor appetite.   Chart reviewed.   Called patient to introduce self and service at [facility].  No answer.  Left message with call back number.  Kaya Klausing B. Zenia Resides, [place], [place] Registered Dietitian [phone] (pager)

## 2019-11-06 ENCOUNTER — Inpatient Hospital Stay (HOSPITAL_COMMUNITY): Payer: Medicare HMO | Attending: Hematology

## 2019-11-06 ENCOUNTER — Other Ambulatory Visit: Payer: Self-pay

## 2019-11-06 ENCOUNTER — Inpatient Hospital Stay (HOSPITAL_COMMUNITY): Payer: Medicare HMO

## 2019-11-06 DIAGNOSIS — C183 Malignant neoplasm of hepatic flexure: Secondary | ICD-10-CM

## 2019-11-06 DIAGNOSIS — Z85038 Personal history of other malignant neoplasm of large intestine: Secondary | ICD-10-CM | POA: Insufficient documentation

## 2019-11-06 LAB — COMPREHENSIVE METABOLIC PANEL
ALT: 15 U/L (ref 0–44)
AST: 22 U/L (ref 15–41)
Albumin: 4 g/dL (ref 3.5–5.0)
Alkaline Phosphatase: 48 U/L (ref 38–126)
Anion gap: 9 (ref 5–15)
BUN: 17 mg/dL (ref 8–23)
CO2: 24 mmol/L (ref 22–32)
Calcium: 9.1 mg/dL (ref 8.9–10.3)
Chloride: 105 mmol/L (ref 98–111)
Creatinine, Ser: 0.88 mg/dL (ref 0.44–1.00)
GFR calc Af Amer: >60 mL/min (ref >60)
GFR calc non Af Amer: >60 mL/min (ref >60)
Glucose, Bld: 95 mg/dL (ref 70–99)
Potassium: 4.8 mmol/L (ref 3.5–5.1)
Sodium: 138 mmol/L (ref 135–145)
Total Bilirubin: 1.3 mg/dL — ABNORMAL HIGH (ref 0.3–1.2)
Total Protein: 6.9 g/dL (ref 6.5–8.1)

## 2019-11-06 LAB — CBC WITH DIFFERENTIAL/PLATELET
Abs Immature Granulocytes: 0 10*3/uL (ref 0.00–0.07)
Basophils Absolute: 0.1 10*3/uL (ref 0.0–0.1)
Basophils Relative: 1 %
Eosinophils Absolute: 0.4 10*3/uL (ref 0.0–0.5)
Eosinophils Relative: 7 %
HCT: 33.2 % — ABNORMAL LOW (ref 36.0–46.0)
Hemoglobin: 11.3 g/dL — ABNORMAL LOW (ref 12.0–15.0)
Immature Granulocytes: 0 %
Lymphocytes Relative: 30 %
Lymphs Abs: 1.5 10*3/uL (ref 0.7–4.0)
MCH: 30.9 pg (ref 26.0–34.0)
MCHC: 34 g/dL (ref 30.0–36.0)
MCV: 90.7 fL (ref 80.0–100.0)
Monocytes Absolute: 0.4 10*3/uL (ref 0.1–1.0)
Monocytes Relative: 8 %
Neutro Abs: 2.6 10*3/uL (ref 1.7–7.7)
Neutrophils Relative %: 54 %
Platelets: 162 10*3/uL (ref 150–400)
RBC: 3.66 MIL/uL — ABNORMAL LOW (ref 3.87–5.11)
RDW: 11.9 % (ref 11.5–15.5)
WBC: 4.9 10*3/uL (ref 4.0–10.5)
nRBC: 0 % (ref 0.0–0.2)

## 2019-11-06 LAB — MAGNESIUM: Magnesium: 1.9 mg/dL (ref 1.7–2.4)

## 2019-11-06 NOTE — Progress Notes (Signed)
Patricia Hurst presents today for portacath flush. VSS. left upper chest port accessed and flushed per protocol. See MAR and IV assessment for details. Port deaccessed and bandaid applied. Site clean and intact. Discharged in satisfactory condition with follow up instructions.

## 2019-11-06 NOTE — Patient Instructions (Signed)
[facility] at [facility]  Discharge Instructions:  Port flushed today. _______________________________________________________________  Thank you for choosing [facility] at [facility] to provide your oncology and hematology care.  To afford each patient quality time with our providers, please arrive at least 15 minutes before your scheduled appointment.  You need to re-schedule your appointment if you arrive 10 or more minutes late.  We strive to give you quality time with our providers, and arriving late affects you and other patients whose appointments are after yours.  Also, if you no show three or more times for appointments you may be dismissed from the clinic.  Again, thank you for choosing [facility] at [facility]. Our hope is that these requests will allow you access to exceptional care and in a timely manner. _______________________________________________________________  If you have questions after your visit, please contact our office at [phone] between the hours of 8:30 a.m. and 5:00 p.m. Voicemails left after 4:30 p.m. will not be returned until the following business day. _______________________________________________________________  For prescription refill requests, have your pharmacy contact our office. _______________________________________________________________  Recommendations made by the consultant and any test results will be sent to your referring physician. _______________________________________________________________ 

## 2019-11-07 LAB — CEA: CEA: 1.7 ng/mL (ref 0.0–4.7)

## 2019-11-13 ENCOUNTER — Inpatient Hospital Stay (HOSPITAL_COMMUNITY): Payer: Medicare HMO | Admitting: Hematology

## 2019-11-13 ENCOUNTER — Other Ambulatory Visit: Payer: Self-pay

## 2019-11-13 VITALS — BP 133/71 | HR 70 | Temp 96.9°F | Resp 18 | Wt 123.6 lb

## 2019-11-13 DIAGNOSIS — C189 Malignant neoplasm of colon, unspecified: Secondary | ICD-10-CM

## 2019-11-13 DIAGNOSIS — Z85038 Personal history of other malignant neoplasm of large intestine: Secondary | ICD-10-CM | POA: Diagnosis not present

## 2019-11-13 NOTE — Patient Instructions (Signed)
[facility] at [facility] Discharge Instructions  You were seen today by Dr. Katragadda. He went over your recent results. You will be scheduled for a CT scan of your abdomen. Dr. Katragadda will see you back in 3 months for labs and follow up.   Thank you for choosing [facility] at [facility] to provide your oncology and hematology care.  To afford each patient quality time with our provider, please arrive at least 15 minutes before your scheduled appointment time.   If you have a lab appointment with the Cancer Center please come in thru the Main Entrance and check in at the main information desk  You need to re-schedule your appointment should you arrive 10 or more minutes late.  We strive to give you quality time with our providers, and arriving late affects you and other patients whose appointments are after yours.  Also, if you no show three or more times for appointments you may be dismissed from the clinic at the providers discretion.     Again, thank you for choosing [facility].  Our hope is that these requests will decrease the amount of time that you wait before being seen by our physicians.       _____________________________________________________________  Should you have questions after your visit to  Cancer Center, please contact our office at [phone] between the hours of 8:00 a.m. and 4:30 p.m.  Voicemails left after 4:00 p.m. will not be returned until the following business day.  For prescription refill requests, have your pharmacy contact our office and allow 72 hours.    Cancer Center Support Programs:   > Cancer Support Group  2nd Tuesday of the month 1pm-2pm, Journey Room    
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
## 2020-01-01 DIAGNOSIS — R5383 Other fatigue: Secondary | ICD-10-CM | POA: Diagnosis not present

## 2020-01-01 DIAGNOSIS — Z79899 Other long term (current) drug therapy: Secondary | ICD-10-CM | POA: Diagnosis not present

## 2020-01-01 DIAGNOSIS — E039 Hypothyroidism, unspecified: Secondary | ICD-10-CM | POA: Diagnosis not present

## 2020-01-01 DIAGNOSIS — Z7189 Other specified counseling: Secondary | ICD-10-CM | POA: Diagnosis not present

## 2020-01-01 DIAGNOSIS — Z0000 Encounter for general adult medical examination without abnormal findings: Secondary | ICD-10-CM | POA: Diagnosis not present

## 2020-01-01 DIAGNOSIS — Z1339 Encounter for screening examination for other mental health and behavioral disorders: Secondary | ICD-10-CM | POA: Diagnosis not present

## 2020-01-01 DIAGNOSIS — Z1331 Encounter for screening for depression: Secondary | ICD-10-CM | POA: Diagnosis not present

## 2020-01-01 DIAGNOSIS — E7800 Pure hypercholesterolemia, unspecified: Secondary | ICD-10-CM | POA: Diagnosis not present

## 2020-01-01 DIAGNOSIS — Z6823 Body mass index (BMI) 23.0-23.9, adult: Secondary | ICD-10-CM | POA: Diagnosis not present

## 2020-01-01 DIAGNOSIS — Z299 Encounter for prophylactic measures, unspecified: Secondary | ICD-10-CM | POA: Diagnosis not present

## 2020-02-06 ENCOUNTER — Inpatient Hospital Stay (HOSPITAL_COMMUNITY): Payer: Medicare HMO

## 2020-02-06 ENCOUNTER — Encounter (HOSPITAL_COMMUNITY): Payer: Self-pay

## 2020-02-06 ENCOUNTER — Inpatient Hospital Stay (HOSPITAL_COMMUNITY): Payer: Medicare HMO | Attending: Hematology

## 2020-02-06 ENCOUNTER — Other Ambulatory Visit: Payer: Self-pay

## 2020-02-06 DIAGNOSIS — Z85038 Personal history of other malignant neoplasm of large intestine: Secondary | ICD-10-CM | POA: Insufficient documentation

## 2020-02-06 DIAGNOSIS — Z452 Encounter for adjustment and management of vascular access device: Secondary | ICD-10-CM | POA: Insufficient documentation

## 2020-02-06 DIAGNOSIS — C182 Malignant neoplasm of ascending colon: Secondary | ICD-10-CM

## 2020-02-06 LAB — COMPREHENSIVE METABOLIC PANEL
ALT: 16 U/L (ref 0–44)
AST: 21 U/L (ref 15–41)
Albumin: 3.9 g/dL (ref 3.5–5.0)
Alkaline Phosphatase: 42 U/L (ref 38–126)
Anion gap: 8 (ref 5–15)
BUN: 17 mg/dL (ref 8–23)
CO2: 28 mmol/L (ref 22–32)
Calcium: 9.2 mg/dL (ref 8.9–10.3)
Chloride: 102 mmol/L (ref 98–111)
Creatinine, Ser: 0.97 mg/dL (ref 0.44–1.00)
GFR, Estimated: 58 mL/min — ABNORMAL LOW (ref >60)
Glucose, Bld: 91 mg/dL (ref 70–99)
Potassium: 4 mmol/L (ref 3.5–5.1)
Sodium: 138 mmol/L (ref 135–145)
Total Bilirubin: 1.5 mg/dL — ABNORMAL HIGH (ref 0.3–1.2)
Total Protein: 6.7 g/dL (ref 6.5–8.1)

## 2020-02-06 LAB — CBC WITH DIFFERENTIAL/PLATELET
Abs Immature Granulocytes: 0.02 10*3/uL (ref 0.00–0.07)
Basophils Absolute: 0.1 10*3/uL (ref 0.0–0.1)
Basophils Relative: 1 %
Eosinophils Absolute: 0.2 10*3/uL (ref 0.0–0.5)
Eosinophils Relative: 4 %
HCT: 34.5 % — ABNORMAL LOW (ref 36.0–46.0)
Hemoglobin: 12 g/dL (ref 12.0–15.0)
Immature Granulocytes: 0 %
Lymphocytes Relative: 32 %
Lymphs Abs: 1.7 10*3/uL (ref 0.7–4.0)
MCH: 31.2 pg (ref 26.0–34.0)
MCHC: 34.8 g/dL (ref 30.0–36.0)
MCV: 89.6 fL (ref 80.0–100.0)
Monocytes Absolute: 0.4 10*3/uL (ref 0.1–1.0)
Monocytes Relative: 8 %
Neutro Abs: 2.9 10*3/uL (ref 1.7–7.7)
Neutrophils Relative %: 55 %
Platelets: 148 10*3/uL — ABNORMAL LOW (ref 150–400)
RBC: 3.85 MIL/uL — ABNORMAL LOW (ref 3.87–5.11)
RDW: 11.6 % (ref 11.5–15.5)
WBC: 5.3 10*3/uL (ref 4.0–10.5)
nRBC: 0 % (ref 0.0–0.2)

## 2020-02-06 LAB — MAGNESIUM: Magnesium: 1.9 mg/dL (ref 1.7–2.4)

## 2020-02-06 MED ORDER — HEPARIN SOD (PORK) LOCK FLUSH 100 UNIT/ML IV SOLN
500.0000 [IU] | Freq: Once | INTRAVENOUS | Status: AC
  Administered 2020-02-06: 500 [IU] via INTRAVENOUS

## 2020-02-06 MED ORDER — SODIUM CHLORIDE 0.9% FLUSH
10.0000 mL | INTRAVENOUS | Status: DC | PRN
  Administered 2020-02-06: 10 mL via INTRAVENOUS

## 2020-02-06 NOTE — Patient Instructions (Signed)
[place] [facility] Discharge Instructions  Portacath flushed per protocol today   Thank you for choosing [place] [facility] to provide your oncology and hematology care.  To afford each patient quality time with our provider, please arrive at least 15 minutes before your scheduled appointment time.   If you have a lab appointment with the [facility] please come in thru the Main Entrance and check in at the main information desk.  You need to re-schedule your appointment should you arrive 10 or more minutes late.  We strive to give you quality time with our providers, and arriving late affects you and other patients whose appointments are after yours.  Also, if you no show three or more times for appointments you may be dismissed from the clinic at the providers discretion.     Again, thank you for choosing [facility].  Our hope is that these requests will decrease the amount of time that you wait before being seen by our physicians.       _____________________________________________________________  Should you have questions after your visit to [facility], please contact our office at [phone] and follow the prompts.  Our office hours are 8:00 a.m. and 4:30 p.m. Monday - Friday.  Please note that voicemails left after 4:00 p.m. may not be returned until the following business day.  We are closed weekends and major holidays.  You do have access to a nurse 24-7, just call the main number to the clinic [phone] and do not press any options, hold on the line and a nurse will answer the phone.    For prescription refill requests, have your pharmacy contact our office and allow 72 hours.    Due to Covid, you will need to wear a mask upon entering the hospital. If you do not have a mask, a mask will be given to you at the Main Entrance upon arrival. For doctor visits, patients may have 1 support person  age 65 or older with them. For treatment visits, patients can not have anyone with them due to social distancing guidelines and our immunocompromised population.

## 2020-02-06 NOTE — Progress Notes (Signed)
Patricia Hurst tolerated portacath flush well without complaints or incident. Port accessed with 20 gauge needle, no blood return noted but flushed easily per protocol without complaints of pain or swelling then de-accessed. VSS Pt discharged self ambulatory in satisfactory condition

## 2020-02-07 ENCOUNTER — Ambulatory Visit (HOSPITAL_COMMUNITY)
Admission: RE | Admit: 2020-02-07 | Discharge: 2020-02-07 | Disposition: A | Discharge status: Home / Self Care | Payer: Medicare HMO | Source: Ambulatory Visit | Attending: Hematology | Admitting: Hematology

## 2020-02-07 DIAGNOSIS — Z9049 Acquired absence of other specified parts of digestive tract: Secondary | ICD-10-CM | POA: Diagnosis not present

## 2020-02-07 DIAGNOSIS — I700 Atherosclerosis of aorta: Secondary | ICD-10-CM | POA: Diagnosis not present

## 2020-02-07 DIAGNOSIS — C183 Malignant neoplasm of hepatic flexure: Secondary | ICD-10-CM | POA: Diagnosis not present

## 2020-02-07 DIAGNOSIS — C189 Malignant neoplasm of colon, unspecified: Secondary | ICD-10-CM | POA: Diagnosis not present

## 2020-02-07 DIAGNOSIS — M47817 Spondylosis without myelopathy or radiculopathy, lumbosacral region: Secondary | ICD-10-CM | POA: Diagnosis not present

## 2020-02-07 MED ORDER — IOHEXOL 300 MG/ML  SOLN
100.0000 mL | Freq: Once | INTRAMUSCULAR | Status: AC | PRN
  Administered 2020-02-07: 100 mL via INTRAVENOUS

## 2020-02-13 ENCOUNTER — Other Ambulatory Visit: Payer: Self-pay

## 2020-02-13 ENCOUNTER — Inpatient Hospital Stay (HOSPITAL_COMMUNITY): Payer: Medicare HMO | Attending: Hematology | Admitting: Hematology

## 2020-02-13 VITALS — BP 144/85 | HR 74 | Temp 97.0°F | Resp 18 | Wt 129.6 lb

## 2020-02-13 DIAGNOSIS — F419 Anxiety disorder, unspecified: Secondary | ICD-10-CM | POA: Insufficient documentation

## 2020-02-13 DIAGNOSIS — Z9049 Acquired absence of other specified parts of digestive tract: Secondary | ICD-10-CM | POA: Diagnosis not present

## 2020-02-13 DIAGNOSIS — Z8349 Family history of other endocrine, nutritional and metabolic diseases: Secondary | ICD-10-CM | POA: Insufficient documentation

## 2020-02-13 DIAGNOSIS — E7800 Pure hypercholesterolemia, unspecified: Secondary | ICD-10-CM | POA: Diagnosis not present

## 2020-02-13 DIAGNOSIS — E039 Hypothyroidism, unspecified: Secondary | ICD-10-CM | POA: Insufficient documentation

## 2020-02-13 DIAGNOSIS — Z8249 Family history of ischemic heart disease and other diseases of the circulatory system: Secondary | ICD-10-CM | POA: Diagnosis not present

## 2020-02-13 DIAGNOSIS — Z9221 Personal history of antineoplastic chemotherapy: Secondary | ICD-10-CM | POA: Insufficient documentation

## 2020-02-13 DIAGNOSIS — Z803 Family history of malignant neoplasm of breast: Secondary | ICD-10-CM | POA: Insufficient documentation

## 2020-02-13 DIAGNOSIS — H401112 Primary open-angle glaucoma, right eye, moderate stage: Secondary | ICD-10-CM | POA: Diagnosis not present

## 2020-02-13 DIAGNOSIS — Z7982 Long term (current) use of aspirin: Secondary | ICD-10-CM | POA: Diagnosis not present

## 2020-02-13 DIAGNOSIS — Z79899 Other long term (current) drug therapy: Secondary | ICD-10-CM | POA: Diagnosis not present

## 2020-02-13 DIAGNOSIS — Z833 Family history of diabetes mellitus: Secondary | ICD-10-CM | POA: Insufficient documentation

## 2020-02-13 DIAGNOSIS — C189 Malignant neoplasm of colon, unspecified: Secondary | ICD-10-CM

## 2020-02-13 DIAGNOSIS — Z85038 Personal history of other malignant neoplasm of large intestine: Secondary | ICD-10-CM | POA: Diagnosis not present
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
## 2020-02-13 NOTE — Patient Instructions (Signed)
[facility] at [facility] Discharge Instructions  You were seen today by Dr. Katragadda. He went over your recent results and scans. Dr. Katragadda will see you back in 3 months for labs and follow up.   Thank you for choosing [facility] at [facility] to provide your oncology and hematology care.  To afford each patient quality time with our provider, please arrive at least 15 minutes before your scheduled appointment time.   If you have a lab appointment with the Cancer Center please come in thru the Main Entrance and check in at the main information desk  You need to re-schedule your appointment should you arrive 10 or more minutes late.  We strive to give you quality time with our providers, and arriving late affects you and other patients whose appointments are after yours.  Also, if you no show three or more times for appointments you may be dismissed from the clinic at the providers discretion.     Again, thank you for choosing [facility].  Our hope is that these requests will decrease the amount of time that you wait before being seen by our physicians.       _____________________________________________________________  Should you have questions after your visit to [facility], please contact our office at [phone] between the hours of 8:00 a.m. and 4:30 p.m.  Voicemails left after 4:00 p.m. will not be returned until the following business day.  For prescription refill requests, have your pharmacy contact our office and allow 72 hours.    Cancer Center Support Programs:   > Cancer Support Group  2nd Tuesday of the month 1pm-2pm, Journey Room    

## 2020-03-28 DIAGNOSIS — Z1231 Encounter for screening mammogram for malignant neoplasm of breast: Secondary | ICD-10-CM | POA: Diagnosis not present

## 2020-04-03 DIAGNOSIS — H401132 Primary open-angle glaucoma, bilateral, moderate stage: Secondary | ICD-10-CM | POA: Diagnosis not present

## 2020-05-08 ENCOUNTER — Encounter (HOSPITAL_COMMUNITY): Payer: Self-pay

## 2020-05-08 ENCOUNTER — Inpatient Hospital Stay (HOSPITAL_COMMUNITY): Payer: Medicare HMO | Attending: Hematology

## 2020-05-08 ENCOUNTER — Other Ambulatory Visit: Payer: Self-pay

## 2020-05-08 ENCOUNTER — Inpatient Hospital Stay (HOSPITAL_COMMUNITY): Payer: Medicare HMO

## 2020-05-08 DIAGNOSIS — E039 Hypothyroidism, unspecified: Secondary | ICD-10-CM | POA: Insufficient documentation

## 2020-05-08 DIAGNOSIS — F419 Anxiety disorder, unspecified: Secondary | ICD-10-CM | POA: Diagnosis not present

## 2020-05-08 DIAGNOSIS — Z452 Encounter for adjustment and management of vascular access device: Secondary | ICD-10-CM | POA: Diagnosis not present

## 2020-05-08 DIAGNOSIS — C183 Malignant neoplasm of hepatic flexure: Secondary | ICD-10-CM | POA: Diagnosis not present

## 2020-05-08 DIAGNOSIS — C189 Malignant neoplasm of colon, unspecified: Secondary | ICD-10-CM

## 2020-05-08 DIAGNOSIS — C182 Malignant neoplasm of ascending colon: Secondary | ICD-10-CM

## 2020-05-08 LAB — CBC WITH DIFFERENTIAL/PLATELET
Abs Immature Granulocytes: 0.01 10*3/uL (ref 0.00–0.07)
Basophils Absolute: 0.1 10*3/uL (ref 0.0–0.1)
Basophils Relative: 1 %
Eosinophils Absolute: 0.3 10*3/uL (ref 0.0–0.5)
Eosinophils Relative: 5 %
HCT: 35.9 % — ABNORMAL LOW (ref 36.0–46.0)
Hemoglobin: 12.5 g/dL (ref 12.0–15.0)
Immature Granulocytes: 0 %
Lymphocytes Relative: 28 %
Lymphs Abs: 1.6 10*3/uL (ref 0.7–4.0)
MCH: 31.9 pg (ref 26.0–34.0)
MCHC: 34.8 g/dL (ref 30.0–36.0)
MCV: 91.6 fL (ref 80.0–100.0)
Monocytes Absolute: 0.5 10*3/uL (ref 0.1–1.0)
Monocytes Relative: 8 %
Neutro Abs: 3.3 10*3/uL (ref 1.7–7.7)
Neutrophils Relative %: 58 %
Platelets: 154 10*3/uL (ref 150–400)
RBC: 3.92 MIL/uL (ref 3.87–5.11)
RDW: 11.8 % (ref 11.5–15.5)
WBC: 5.7 10*3/uL (ref 4.0–10.5)
nRBC: 0 % (ref 0.0–0.2)

## 2020-05-08 LAB — COMPREHENSIVE METABOLIC PANEL
ALT: 15 U/L (ref 0–44)
AST: 22 U/L (ref 15–41)
Albumin: 4.2 g/dL (ref 3.5–5.0)
Alkaline Phosphatase: 42 U/L (ref 38–126)
Anion gap: 6 (ref 5–15)
BUN: 15 mg/dL (ref 8–23)
CO2: 28 mmol/L (ref 22–32)
Calcium: 9.3 mg/dL (ref 8.9–10.3)
Chloride: 105 mmol/L (ref 98–111)
Creatinine, Ser: 1.01 mg/dL — ABNORMAL HIGH (ref 0.44–1.00)
GFR, Estimated: 58 mL/min — ABNORMAL LOW (ref >60)
Glucose, Bld: 97 mg/dL (ref 70–99)
Potassium: 4.6 mmol/L (ref 3.5–5.1)
Sodium: 139 mmol/L (ref 135–145)
Total Bilirubin: 1.4 mg/dL — ABNORMAL HIGH (ref 0.3–1.2)
Total Protein: 6.8 g/dL (ref 6.5–8.1)

## 2020-05-08 LAB — MAGNESIUM: Magnesium: 1.7 mg/dL (ref 1.7–2.4)

## 2020-05-08 MED ORDER — SODIUM CHLORIDE 0.9% FLUSH
10.0000 mL | INTRAVENOUS | Status: DC | PRN
  Administered 2020-05-08: 10 mL via INTRAVENOUS

## 2020-05-08 MED ORDER — HEPARIN SOD (PORK) LOCK FLUSH 100 UNIT/ML IV SOLN
500.0000 [IU] | Freq: Once | INTRAVENOUS | Status: AC
  Administered 2020-05-08: 500 [IU] via INTRAVENOUS

## 2020-05-08 NOTE — Progress Notes (Signed)
Patricia Hurst tolerated portacath flush well without complaints or incident. VSS Pt discharged self ambulatory in satisfactory condition

## 2020-05-08 NOTE — Patient Instructions (Signed)
[facility] at [facility] Discharge Instructions  Portacath flushed per protocol today. Follow-up as scheduled   Thank you for choosing [facility] at [facility] to provide your oncology and hematology care.  To afford each patient quality time with our provider, please arrive at least 15 minutes before your scheduled appointment time.   If you have a lab appointment with the Cancer Center please come in thru the Main Entrance and check in at the main information desk.  You need to re-schedule your appointment should you arrive 10 or more minutes late.  We strive to give you quality time with our providers, and arriving late affects you and other patients whose appointments are after yours.  Also, if you no show three or more times for appointments you may be dismissed from the clinic at the providers discretion.     Again, thank you for choosing [facility].  Our hope is that these requests will decrease the amount of time that you wait before being seen by our physicians.       _____________________________________________________________  Should you have questions after your visit to [facility], please contact our office at [phone] and follow the prompts.  Our office hours are 8:00 a.m. and 4:30 p.m. Monday - Friday.  Please note that voicemails left after 4:00 p.m. may not be returned until the following business day.  We are closed weekends and major holidays.  You do have access to a nurse 24-7, just call the main number to the clinic [phone] and do not press any options, hold on the line and a nurse will answer the phone.    For prescription refill requests, have your pharmacy contact our office and allow 72 hours.    Due to Covid, you will need to wear a mask upon entering the hospital. If you do not have a mask, a mask will be given to you at the Main Entrance upon arrival. For doctor visits, patients may  have 1 support person age 18 or older with them. For treatment visits, patients can not have anyone with them due to social distancing guidelines and our immunocompromised population.     

## 2020-05-09 LAB — CEA: CEA: 1.6 ng/mL (ref 0.0–4.7)

## 2020-05-15 ENCOUNTER — Other Ambulatory Visit: Payer: Self-pay

## 2020-05-15 ENCOUNTER — Encounter (HOSPITAL_COMMUNITY): Payer: Self-pay | Admitting: Hematology

## 2020-05-15 ENCOUNTER — Inpatient Hospital Stay (HOSPITAL_COMMUNITY): Payer: Medicare HMO | Admitting: Hematology

## 2020-05-15 VITALS — BP 142/71 | HR 84 | Temp 96.8°F | Resp 20 | Wt 133.3 lb

## 2020-05-15 DIAGNOSIS — C183 Malignant neoplasm of hepatic flexure: Secondary | ICD-10-CM | POA: Diagnosis not present

## 2020-05-15 DIAGNOSIS — Z8719 Personal history of other diseases of the digestive system: Secondary | ICD-10-CM | POA: Insufficient documentation

## 2020-05-15 DIAGNOSIS — E7800 Pure hypercholesterolemia, unspecified: Secondary | ICD-10-CM | POA: Insufficient documentation

## 2020-05-15 DIAGNOSIS — K279 Peptic ulcer, site unspecified, unspecified as acute or chronic, without hemorrhage or perforation: Secondary | ICD-10-CM | POA: Insufficient documentation

## 2020-05-15 DIAGNOSIS — F419 Anxiety disorder, unspecified: Secondary | ICD-10-CM | POA: Diagnosis not present

## 2020-05-15 DIAGNOSIS — E039 Hypothyroidism, unspecified: Secondary | ICD-10-CM | POA: Diagnosis not present

## 2020-05-15 DIAGNOSIS — Z452 Encounter for adjustment and management of vascular access device: Secondary | ICD-10-CM | POA: Diagnosis not present

## 2020-05-15 DIAGNOSIS — H409 Unspecified glaucoma: Secondary | ICD-10-CM | POA: Insufficient documentation
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
## 2020-05-15 NOTE — Patient Instructions (Signed)
[place] at [facility] Discharge Instructions  You were seen today by Dr. Delton Coombes. He went over your recent results and scans. Drink plenty of water, at least 2 liters, to keep your kidneys flushed. You will be scheduled for a CT scan of your abdomen before your next visit. Dr. Delton Coombes will see you back in 3 months for labs and follow up.   Thank you for choosing [place] at [facility] to provide your oncology and hematology care.  To afford each patient quality time with our provider, please arrive at least 15 minutes before your scheduled appointment time.   If you have a lab appointment with the [facility] please come in thru the Main Entrance and check in at the main information desk  You need to re-schedule your appointment should you arrive 10 or more minutes late.  We strive to give you quality time with our providers, and arriving late affects you and other patients whose appointments are after yours.  Also, if you no show three or more times for appointments you may be dismissed from the clinic at the providers discretion.     Again, thank you for choosing [facility].  Our hope is that these requests will decrease the amount of time that you wait before being seen by our physicians.       _____________________________________________________________  Should you have questions after your visit to [facility], please contact our office at [phone] between the hours of 8:00 a.m. and 4:30 p.m.  Voicemails left after 4:00 p.m. will not be returned until the following business day.  For prescription refill requests, have your pharmacy contact our office and allow 72 hours.    Cancer Center Support Programs:   > Cancer Support Group  2nd Tuesday of the month 1pm-2pm, Journey Room

## 2020-08-04 DIAGNOSIS — H401132 Primary open-angle glaucoma, bilateral, moderate stage: Secondary | ICD-10-CM | POA: Diagnosis not present

## 2020-08-13 ENCOUNTER — Other Ambulatory Visit (HOSPITAL_COMMUNITY): Payer: Self-pay | Admitting: *Deleted

## 2020-08-14 ENCOUNTER — Other Ambulatory Visit: Payer: Self-pay

## 2020-08-14 ENCOUNTER — Ambulatory Visit (HOSPITAL_COMMUNITY)
Admission: RE | Admit: 2020-08-14 | Discharge: 2020-08-14 | Disposition: A | Discharge status: Home / Self Care | Payer: Medicare HMO | Source: Ambulatory Visit | Attending: Hematology | Admitting: Hematology

## 2020-08-14 ENCOUNTER — Inpatient Hospital Stay (HOSPITAL_COMMUNITY): Payer: Medicare HMO | Attending: Hematology

## 2020-08-14 DIAGNOSIS — I700 Atherosclerosis of aorta: Secondary | ICD-10-CM | POA: Diagnosis not present

## 2020-08-14 DIAGNOSIS — F419 Anxiety disorder, unspecified: Secondary | ICD-10-CM | POA: Insufficient documentation

## 2020-08-14 DIAGNOSIS — Z452 Encounter for adjustment and management of vascular access device: Secondary | ICD-10-CM | POA: Diagnosis not present

## 2020-08-14 DIAGNOSIS — C183 Malignant neoplasm of hepatic flexure: Secondary | ICD-10-CM

## 2020-08-14 DIAGNOSIS — Z9049 Acquired absence of other specified parts of digestive tract: Secondary | ICD-10-CM | POA: Diagnosis not present

## 2020-08-14 DIAGNOSIS — Z90710 Acquired absence of both cervix and uterus: Secondary | ICD-10-CM | POA: Diagnosis not present

## 2020-08-14 LAB — COMPREHENSIVE METABOLIC PANEL
ALT: 14 U/L (ref 0–44)
AST: 21 U/L (ref 15–41)
Albumin: 4.3 g/dL (ref 3.5–5.0)
Alkaline Phosphatase: 48 U/L (ref 38–126)
Anion gap: 8 (ref 5–15)
BUN: 14 mg/dL (ref 8–23)
CO2: 25 mmol/L (ref 22–32)
Calcium: 9.4 mg/dL (ref 8.9–10.3)
Chloride: 105 mmol/L (ref 98–111)
Creatinine, Ser: 0.98 mg/dL (ref 0.44–1.00)
GFR, Estimated: >60 mL/min (ref >60)
Glucose, Bld: 97 mg/dL (ref 70–99)
Potassium: 4.5 mmol/L (ref 3.5–5.1)
Sodium: 138 mmol/L (ref 135–145)
Total Bilirubin: 1.6 mg/dL — ABNORMAL HIGH (ref 0.3–1.2)
Total Protein: 7.3 g/dL (ref 6.5–8.1)

## 2020-08-14 LAB — CBC WITH DIFFERENTIAL/PLATELET
Abs Immature Granulocytes: 0.01 10*3/uL (ref 0.00–0.07)
Basophils Absolute: 0.1 10*3/uL (ref 0.0–0.1)
Basophils Relative: 1 %
Eosinophils Absolute: 0.4 10*3/uL (ref 0.0–0.5)
Eosinophils Relative: 7 %
HCT: 36.8 % (ref 36.0–46.0)
Hemoglobin: 12.8 g/dL (ref 12.0–15.0)
Immature Granulocytes: 0 %
Lymphocytes Relative: 17 %
Lymphs Abs: 1 10*3/uL (ref 0.7–4.0)
MCH: 32.5 pg (ref 26.0–34.0)
MCHC: 34.8 g/dL (ref 30.0–36.0)
MCV: 93.4 fL (ref 80.0–100.0)
Monocytes Absolute: 0.6 10*3/uL (ref 0.1–1.0)
Monocytes Relative: 10 %
Neutro Abs: 3.8 10*3/uL (ref 1.7–7.7)
Neutrophils Relative %: 65 %
Platelets: 156 10*3/uL (ref 150–400)
RBC: 3.94 MIL/uL (ref 3.87–5.11)
RDW: 11.6 % (ref 11.5–15.5)
WBC: 5.9 10*3/uL (ref 4.0–10.5)
nRBC: 0 % (ref 0.0–0.2)

## 2020-08-14 LAB — MAGNESIUM: Magnesium: 1.8 mg/dL (ref 1.7–2.4)

## 2020-08-14 MED ORDER — IOHEXOL 300 MG/ML  SOLN
100.0000 mL | Freq: Once | INTRAMUSCULAR | Status: AC | PRN
  Administered 2020-08-14: 100 mL via INTRAVENOUS

## 2020-08-15 LAB — CEA: CEA: 1.4 ng/mL (ref 0.0–4.7)

## 2020-08-20 ENCOUNTER — Inpatient Hospital Stay (HOSPITAL_COMMUNITY): Payer: Medicare HMO

## 2020-08-20 ENCOUNTER — Other Ambulatory Visit: Payer: Self-pay

## 2020-08-20 ENCOUNTER — Inpatient Hospital Stay (HOSPITAL_BASED_OUTPATIENT_CLINIC_OR_DEPARTMENT_OTHER): Payer: Medicare HMO | Admitting: Hematology

## 2020-08-20 VITALS — BP 149/76 | HR 86 | Temp 96.9°F | Resp 18 | Wt 136.4 lb

## 2020-08-20 DIAGNOSIS — C189 Malignant neoplasm of colon, unspecified: Secondary | ICD-10-CM

## 2020-08-20 DIAGNOSIS — C183 Malignant neoplasm of hepatic flexure: Secondary | ICD-10-CM

## 2020-08-20 DIAGNOSIS — F419 Anxiety disorder, unspecified: Secondary | ICD-10-CM | POA: Diagnosis not present

## 2020-08-20 DIAGNOSIS — Z452 Encounter for adjustment and management of vascular access device: Secondary | ICD-10-CM | POA: Diagnosis not present

## 2020-08-20 MED ORDER — SODIUM CHLORIDE 0.9% FLUSH
10.0000 mL | Freq: Once | INTRAVENOUS | Status: AC
  Administered 2020-08-20: 10 mL

## 2020-08-20 MED ORDER — HEPARIN SOD (PORK) LOCK FLUSH 100 UNIT/ML IV SOLN
500.0000 [IU] | Freq: Once | INTRAVENOUS | Status: AC
  Administered 2020-08-20: 500 [IU] via INTRAVENOUS

## 2020-08-20 NOTE — Patient Instructions (Signed)
[facility] at [facility] Discharge Instructions  You were seen and examined today by Dr. Katragadda. Please follow up as scheduled.    Thank you for choosing [facility] at [facility] to provide your oncology and hematology care.  To afford each patient quality time with our provider, please arrive at least 15 minutes before your scheduled appointment time.   If you have a lab appointment with the Cancer Center please come in thru the Main Entrance and check in at the main information desk.  You need to re-schedule your appointment should you arrive 10 or more minutes late.  We strive to give you quality time with our providers, and arriving late affects you and other patients whose appointments are after yours.  Also, if you no show three or more times for appointments you may be dismissed from the clinic at the providers discretion.     Again, thank you for choosing [facility].  Our hope is that these requests will decrease the amount of time that you wait before being seen by our physicians.       _____________________________________________________________  Should you have questions after your visit to  Cancer Center, please contact our office at [phone] and follow the prompts.  Our office hours are 8:00 a.m. and 4:30 p.m. Monday - Friday.  Please note that voicemails left after 4:00 p.m. may not be returned until the following business day.  We are closed weekends and major holidays.  You do have access to a nurse 24-7, just call the main number to the clinic [phone] and do not press any options, hold on the line and a nurse will answer the phone.    For prescription refill requests, have your pharmacy contact our office and allow 72 hours.    Due to Covid, you will need to wear a mask upon entering the hospital. If you do not have a mask, a mask will be given to you at the Main Entrance upon arrival. For  doctor visits, patients may have 1 support person age 18 or older with them. For treatment visits, patients can not have anyone with them due to social distancing guidelines and our immunocompromised population.      
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
## 2020-08-20 NOTE — Progress Notes (Signed)
Patricia Hurst presented for Portacath access and flush. Portacath flushed with 10 ml NS and 500U/28ml Heparin per protocol and needle removed intact. Procedure without incident. Patient tolerated procedure well. Patient has no complaints today and has a follow up visit with Dr. Delton Coombes. Vital signs stable. No complaints at this time. Alert and oriented x 3. F/U with [facility] as scheduled.

## 2020-11-05 ENCOUNTER — Ambulatory Visit
Admission: EM | Admit: 2020-11-05 | Discharge: 2020-11-05 | Disposition: A | Discharge status: Home / Self Care | Payer: Medicare HMO | Attending: Internal Medicine | Admitting: Internal Medicine

## 2020-11-05 ENCOUNTER — Other Ambulatory Visit: Payer: Self-pay

## 2020-11-05 ENCOUNTER — Encounter: Payer: Self-pay | Admitting: Emergency Medicine

## 2020-11-05 DIAGNOSIS — R2241 Localized swelling, mass and lump, right lower limb: Secondary | ICD-10-CM

## 2020-11-05 MED ORDER — LIDOCAINE-PRILOCAINE 2.5-2.5 % EX CREA: 1.0000 "application " | TOPICAL_CREAM | CUTANEOUS | 0 refills | Status: DC | PRN

## 2020-11-05 NOTE — ED Triage Notes (Signed)
Insect bite to RT lower leg x 2 weeks ago.  Area is red and itchy.  Top of RT foot has some swelling too.
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
## 2020-11-05 NOTE — Discharge Instructions (Addendum)
Apply lotion to the affected area as needed Elevation of the right leg If swelling persists, you develop calf pain/tightness or shortness of breath/chest pain-return to urgent care immediately to be reevaluated. If you develop worsening redness of the right leg-return to urgent care to be reevaluated.

## 2020-11-18 ENCOUNTER — Other Ambulatory Visit: Payer: Self-pay

## 2020-11-18 ENCOUNTER — Inpatient Hospital Stay (HOSPITAL_COMMUNITY): Payer: Medicare HMO | Attending: Hematology

## 2020-11-18 DIAGNOSIS — C183 Malignant neoplasm of hepatic flexure: Secondary | ICD-10-CM | POA: Diagnosis not present

## 2020-11-18 DIAGNOSIS — C189 Malignant neoplasm of colon, unspecified: Secondary | ICD-10-CM

## 2020-11-18 LAB — COMPREHENSIVE METABOLIC PANEL
ALT: 14 U/L (ref 0–44)
AST: 22 U/L (ref 15–41)
Albumin: 4.1 g/dL (ref 3.5–5.0)
Alkaline Phosphatase: 46 U/L (ref 38–126)
Anion gap: 4 — ABNORMAL LOW (ref 5–15)
BUN: 16 mg/dL (ref 8–23)
CO2: 27 mmol/L (ref 22–32)
Calcium: 9.2 mg/dL (ref 8.9–10.3)
Chloride: 106 mmol/L (ref 98–111)
Creatinine, Ser: 1.04 mg/dL — ABNORMAL HIGH (ref 0.44–1.00)
GFR, Estimated: 56 mL/min — ABNORMAL LOW (ref >60)
Glucose, Bld: 86 mg/dL (ref 70–99)
Potassium: 4.6 mmol/L (ref 3.5–5.1)
Sodium: 137 mmol/L (ref 135–145)
Total Bilirubin: 1.3 mg/dL — ABNORMAL HIGH (ref 0.3–1.2)
Total Protein: 7.1 g/dL (ref 6.5–8.1)

## 2020-11-18 LAB — CBC WITH DIFFERENTIAL/PLATELET
Abs Immature Granulocytes: 0.02 10*3/uL (ref 0.00–0.07)
Basophils Absolute: 0.1 10*3/uL (ref 0.0–0.1)
Basophils Relative: 1 %
Eosinophils Absolute: 0.3 10*3/uL (ref 0.0–0.5)
Eosinophils Relative: 5 %
HCT: 35.8 % — ABNORMAL LOW (ref 36.0–46.0)
Hemoglobin: 12.5 g/dL (ref 12.0–15.0)
Immature Granulocytes: 0 %
Lymphocytes Relative: 32 %
Lymphs Abs: 1.5 10*3/uL (ref 0.7–4.0)
MCH: 32.1 pg (ref 26.0–34.0)
MCHC: 34.9 g/dL (ref 30.0–36.0)
MCV: 91.8 fL (ref 80.0–100.0)
Monocytes Absolute: 0.5 10*3/uL (ref 0.1–1.0)
Monocytes Relative: 11 %
Neutro Abs: 2.4 10*3/uL (ref 1.7–7.7)
Neutrophils Relative %: 51 %
Platelets: 161 10*3/uL (ref 150–400)
RBC: 3.9 MIL/uL (ref 3.87–5.11)
RDW: 11.7 % (ref 11.5–15.5)
WBC: 4.8 10*3/uL (ref 4.0–10.5)
nRBC: 0 % (ref 0.0–0.2)

## 2020-11-18 LAB — MAGNESIUM: Magnesium: 1.9 mg/dL (ref 1.7–2.4)

## 2020-11-19 ENCOUNTER — Other Ambulatory Visit (HOSPITAL_COMMUNITY): Payer: Medicare HMO

## 2020-11-19 LAB — CEA: CEA: 1.6 ng/mL (ref 0.0–4.7)

## 2020-11-26 ENCOUNTER — Other Ambulatory Visit (HOSPITAL_COMMUNITY): Payer: Self-pay

## 2020-11-26 ENCOUNTER — Inpatient Hospital Stay (HOSPITAL_COMMUNITY): Payer: Medicare HMO

## 2020-11-26 ENCOUNTER — Inpatient Hospital Stay (HOSPITAL_COMMUNITY): Payer: Medicare HMO | Attending: Hematology | Admitting: Nurse Practitioner

## 2020-11-26 ENCOUNTER — Other Ambulatory Visit: Payer: Self-pay

## 2020-11-26 VITALS — BP 139/68 | HR 72 | Temp 96.8°F | Resp 18 | Wt 137.1 lb

## 2020-11-26 DIAGNOSIS — C189 Malignant neoplasm of colon, unspecified: Secondary | ICD-10-CM

## 2020-11-26 DIAGNOSIS — Z95828 Presence of other vascular implants and grafts: Secondary | ICD-10-CM

## 2020-11-26 DIAGNOSIS — C183 Malignant neoplasm of hepatic flexure: Secondary | ICD-10-CM | POA: Insufficient documentation

## 2020-11-26 DIAGNOSIS — Z452 Encounter for adjustment and management of vascular access device: Secondary | ICD-10-CM | POA: Diagnosis not present

## 2020-11-26 MED ORDER — HEPARIN SOD (PORK) LOCK FLUSH 100 UNIT/ML IV SOLN
500.0000 [IU] | Freq: Once | INTRAVENOUS | Status: AC
  Administered 2020-11-26: 500 [IU] via INTRAVENOUS

## 2020-11-26 MED ORDER — SODIUM CHLORIDE 0.9% FLUSH
10.0000 mL | Freq: Once | INTRAVENOUS | Status: AC
Start: 2020-11-26 — End: 2020-11-26
  Administered 2020-11-26: 10 mL via INTRAVENOUS
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
## 2020-11-26 NOTE — Progress Notes (Signed)
Patients port flushed without difficulty.  No blood return noted with no bruising or swelling noted at site.  Band aid applied.  VSS with discharge and left in satisfactory condition with no s/s of distress noted.   

## 2020-12-08 DIAGNOSIS — H401132 Primary open-angle glaucoma, bilateral, moderate stage: Secondary | ICD-10-CM | POA: Diagnosis not present

## 2021-01-05 DIAGNOSIS — U071 COVID-19: Secondary | ICD-10-CM | POA: Diagnosis not present

## 2021-01-05 DIAGNOSIS — N183 Chronic kidney disease, stage 3 unspecified: Secondary | ICD-10-CM | POA: Diagnosis not present

## 2021-01-05 DIAGNOSIS — C189 Malignant neoplasm of colon, unspecified: Secondary | ICD-10-CM | POA: Diagnosis not present

## 2021-01-05 DIAGNOSIS — Z299 Encounter for prophylactic measures, unspecified: Secondary | ICD-10-CM | POA: Diagnosis not present

## 2021-02-24 ENCOUNTER — Other Ambulatory Visit (HOSPITAL_COMMUNITY): Payer: Self-pay

## 2021-02-24 DIAGNOSIS — C189 Malignant neoplasm of colon, unspecified: Secondary | ICD-10-CM

## 2021-02-25 ENCOUNTER — Other Ambulatory Visit: Payer: Self-pay

## 2021-02-25 ENCOUNTER — Ambulatory Visit (HOSPITAL_COMMUNITY)
Admission: RE | Admit: 2021-02-25 | Discharge: 2021-02-25 | Disposition: A | Discharge status: Home / Self Care | Payer: Medicare HMO | Source: Ambulatory Visit | Attending: Nurse Practitioner | Admitting: Nurse Practitioner

## 2021-02-25 ENCOUNTER — Inpatient Hospital Stay (HOSPITAL_COMMUNITY): Payer: Medicare HMO | Attending: Hematology

## 2021-02-25 DIAGNOSIS — F419 Anxiety disorder, unspecified: Secondary | ICD-10-CM | POA: Insufficient documentation

## 2021-02-25 DIAGNOSIS — Z9049 Acquired absence of other specified parts of digestive tract: Secondary | ICD-10-CM | POA: Diagnosis not present

## 2021-02-25 DIAGNOSIS — Z85038 Personal history of other malignant neoplasm of large intestine: Secondary | ICD-10-CM | POA: Insufficient documentation

## 2021-02-25 DIAGNOSIS — C189 Malignant neoplasm of colon, unspecified: Secondary | ICD-10-CM | POA: Diagnosis not present

## 2021-02-25 DIAGNOSIS — I700 Atherosclerosis of aorta: Secondary | ICD-10-CM | POA: Diagnosis not present

## 2021-02-25 DIAGNOSIS — Z9889 Other specified postprocedural states: Secondary | ICD-10-CM | POA: Diagnosis not present

## 2021-02-25 DIAGNOSIS — C183 Malignant neoplasm of hepatic flexure: Secondary | ICD-10-CM

## 2021-02-25 DIAGNOSIS — Z452 Encounter for adjustment and management of vascular access device: Secondary | ICD-10-CM | POA: Insufficient documentation

## 2021-02-25 LAB — CBC WITH DIFFERENTIAL/PLATELET
Abs Immature Granulocytes: 0.01 10*3/uL (ref 0.00–0.07)
Basophils Absolute: 0.1 10*3/uL (ref 0.0–0.1)
Basophils Relative: 1 %
Eosinophils Absolute: 0.2 10*3/uL (ref 0.0–0.5)
Eosinophils Relative: 4 %
HCT: 37.1 % (ref 36.0–46.0)
Hemoglobin: 13.2 g/dL (ref 12.0–15.0)
Immature Granulocytes: 0 %
Lymphocytes Relative: 29 %
Lymphs Abs: 1.6 10*3/uL (ref 0.7–4.0)
MCH: 32.7 pg (ref 26.0–34.0)
MCHC: 35.6 g/dL (ref 30.0–36.0)
MCV: 91.8 fL (ref 80.0–100.0)
Monocytes Absolute: 0.5 10*3/uL (ref 0.1–1.0)
Monocytes Relative: 9 %
Neutro Abs: 3 10*3/uL (ref 1.7–7.7)
Neutrophils Relative %: 57 %
Platelets: 179 10*3/uL (ref 150–400)
RBC: 4.04 MIL/uL (ref 3.87–5.11)
RDW: 11.9 % (ref 11.5–15.5)
WBC: 5.4 10*3/uL (ref 4.0–10.5)
nRBC: 0 % (ref 0.0–0.2)

## 2021-02-25 LAB — COMPREHENSIVE METABOLIC PANEL
ALT: 15 U/L (ref 0–44)
AST: 23 U/L (ref 15–41)
Albumin: 4.5 g/dL (ref 3.5–5.0)
Alkaline Phosphatase: 51 U/L (ref 38–126)
Anion gap: 8 (ref 5–15)
BUN: 14 mg/dL (ref 8–23)
CO2: 26 mmol/L (ref 22–32)
Calcium: 9.6 mg/dL (ref 8.9–10.3)
Chloride: 105 mmol/L (ref 98–111)
Creatinine, Ser: 0.98 mg/dL (ref 0.44–1.00)
GFR, Estimated: >60 mL/min (ref >60)
Glucose, Bld: 95 mg/dL (ref 70–99)
Potassium: 4.4 mmol/L (ref 3.5–5.1)
Sodium: 139 mmol/L (ref 135–145)
Total Bilirubin: 2.1 mg/dL — ABNORMAL HIGH (ref 0.3–1.2)
Total Protein: 7.7 g/dL (ref 6.5–8.1)

## 2021-02-25 LAB — MAGNESIUM: Magnesium: 1.9 mg/dL (ref 1.7–2.4)

## 2021-02-25 MED ORDER — IOHEXOL 300 MG/ML  SOLN
100.0000 mL | Freq: Once | INTRAMUSCULAR | Status: AC | PRN
  Administered 2021-02-25: 100 mL via INTRAVENOUS

## 2021-02-26 DIAGNOSIS — Z1339 Encounter for screening examination for other mental health and behavioral disorders: Secondary | ICD-10-CM | POA: Diagnosis not present

## 2021-02-26 DIAGNOSIS — Z0000 Encounter for general adult medical examination without abnormal findings: Secondary | ICD-10-CM | POA: Diagnosis not present

## 2021-02-26 DIAGNOSIS — E039 Hypothyroidism, unspecified: Secondary | ICD-10-CM | POA: Diagnosis not present

## 2021-02-26 DIAGNOSIS — Z79899 Other long term (current) drug therapy: Secondary | ICD-10-CM | POA: Diagnosis not present

## 2021-02-26 DIAGNOSIS — Z6825 Body mass index (BMI) 25.0-25.9, adult: Secondary | ICD-10-CM | POA: Diagnosis not present

## 2021-02-26 DIAGNOSIS — Z7189 Other specified counseling: Secondary | ICD-10-CM | POA: Diagnosis not present

## 2021-02-26 DIAGNOSIS — I700 Atherosclerosis of aorta: Secondary | ICD-10-CM | POA: Diagnosis not present

## 2021-02-26 DIAGNOSIS — Z789 Other specified health status: Secondary | ICD-10-CM | POA: Diagnosis not present

## 2021-02-26 DIAGNOSIS — R5383 Other fatigue: Secondary | ICD-10-CM | POA: Diagnosis not present

## 2021-02-26 DIAGNOSIS — Z299 Encounter for prophylactic measures, unspecified: Secondary | ICD-10-CM | POA: Diagnosis not present

## 2021-02-26 DIAGNOSIS — E7800 Pure hypercholesterolemia, unspecified: Secondary | ICD-10-CM | POA: Diagnosis not present

## 2021-02-26 DIAGNOSIS — Z1331 Encounter for screening for depression: Secondary | ICD-10-CM | POA: Diagnosis not present

## 2021-02-26 LAB — CEA: CEA: 1.8 ng/mL (ref 0.0–4.7)
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
## 2021-03-04 ENCOUNTER — Ambulatory Visit (HOSPITAL_COMMUNITY): Payer: Medicare HMO | Admitting: Hematology

## 2021-03-04 ENCOUNTER — Inpatient Hospital Stay (HOSPITAL_BASED_OUTPATIENT_CLINIC_OR_DEPARTMENT_OTHER): Payer: Medicare HMO | Admitting: Hematology

## 2021-03-04 ENCOUNTER — Inpatient Hospital Stay (HOSPITAL_COMMUNITY): Payer: Medicare HMO

## 2021-03-04 ENCOUNTER — Encounter (HOSPITAL_COMMUNITY): Payer: Medicare HMO

## 2021-03-04 ENCOUNTER — Encounter (HOSPITAL_COMMUNITY): Payer: Self-pay | Admitting: Hematology

## 2021-03-04 ENCOUNTER — Other Ambulatory Visit: Payer: Self-pay

## 2021-03-04 VITALS — BP 147/92 | HR 82 | Temp 98.0°F | Resp 18 | Wt 138.4 lb

## 2021-03-04 DIAGNOSIS — F419 Anxiety disorder, unspecified: Secondary | ICD-10-CM | POA: Diagnosis not present

## 2021-03-04 DIAGNOSIS — Z452 Encounter for adjustment and management of vascular access device: Secondary | ICD-10-CM | POA: Diagnosis not present

## 2021-03-04 DIAGNOSIS — C183 Malignant neoplasm of hepatic flexure: Secondary | ICD-10-CM | POA: Diagnosis not present

## 2021-03-04 DIAGNOSIS — C189 Malignant neoplasm of colon, unspecified: Secondary | ICD-10-CM | POA: Diagnosis not present

## 2021-03-04 DIAGNOSIS — Z85038 Personal history of other malignant neoplasm of large intestine: Secondary | ICD-10-CM | POA: Diagnosis not present

## 2021-03-04 MED ORDER — SODIUM CHLORIDE 0.9% FLUSH
10.0000 mL | Freq: Once | INTRAVENOUS | Status: AC
  Administered 2021-03-04: 10 mL

## 2021-03-04 MED ORDER — HEPARIN SOD (PORK) LOCK FLUSH 100 UNIT/ML IV SOLN
500.0000 [IU] | Freq: Once | INTRAVENOUS | Status: AC
  Administered 2021-03-04: 500 [IU] via INTRAVENOUS

## 2021-03-04 NOTE — Patient Instructions (Signed)
[place] at [facility] Discharge Instructions  You were seen and examined today by Dr. Delton Coombes. He reviewed your scan results, which were normal. Return as scheduled in 3 months for lab work and office visit.    Thank you for choosing [place] at [facility] to provide your oncology and hematology care.  To afford each patient quality time with our provider, please arrive at least 15 minutes before your scheduled appointment time.   If you have a lab appointment with the [place] please come in thru the Main Entrance and check in at the main information desk.  You need to re-schedule your appointment should you arrive 10 or more minutes late.  We strive to give you quality time with our providers, and arriving late affects you and other patients whose appointments are after yours.  Also, if you no show three or more times for appointments you may be dismissed from the clinic at the providers discretion.     Again, thank you for choosing [facility].  Our hope is that these requests will decrease the amount of time that you wait before being seen by our physicians.       _____________________________________________________________  Should you have questions after your visit to [facility], please contact our office at [phone] and follow the prompts.  Our office hours are 8:00 a.m. and 4:30 p.m. Monday - Friday.  Please note that voicemails left after 4:00 p.m. may not be returned until the following business day.  We are closed weekends and major holidays.  You do have access to a nurse 24-7, just call the main number to the clinic [phone] and do not press any options, hold on the line and a nurse will answer the phone.    For prescription refill requests, have your pharmacy contact our office and allow 72 hours.    Due to Covid, you will need to wear a mask upon entering the hospital. If you do not  have a mask, a mask will be given to you at the Main Entrance upon arrival. For doctor visits, patients may have 1 support person age 72 or older with them. For treatment visits, patients can not have anyone with them due to social distancing guidelines and our immunocompromised population.

## 2021-03-04 NOTE — Progress Notes (Signed)
Port Flush given today per MD orders Tolerated without adverse affects. Vital signs stable. No complaints at this time. Discharged from clinic ambulatory in stable condition. Alert and oriented x 3. F/U with [facility] as scheduled.

## 2021-03-09 IMAGING — CT CT HEAD W/O CM
3 series · 15 of 47 positions shown, 18 images · non-contrast
Comparison: Report from MRI/MRA head 06/09/2017 (images
unavailable)

CLINICAL DATA: Focal neuro deficit, greater than 6 hours, stroke
suspected. Additional history provided: Colon cancer, weakness,
drooling, slurred speech, received second chemo treatment 2 weeks
ago.

EXAM:
CT HEAD WITHOUT CONTRAST
TECHNIQUE: Contiguous axial images were obtained from the base of the skull
through the vertex without intravenous contrast.

[Series 2: head w o · axial · 0.39mm/px · z∈[+0,+125]mm · 9 of 30 slices shown, 12 images]
[im 3/30  brain]
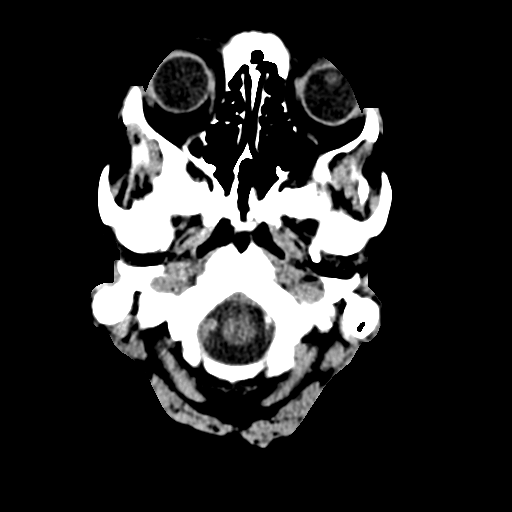
[im 3/30  bone]
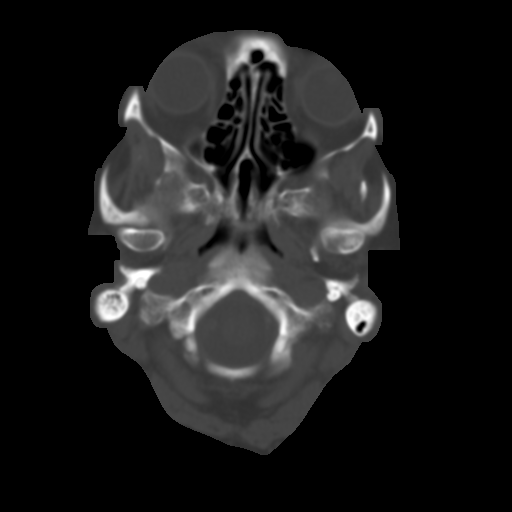
[im 6/30  brain]
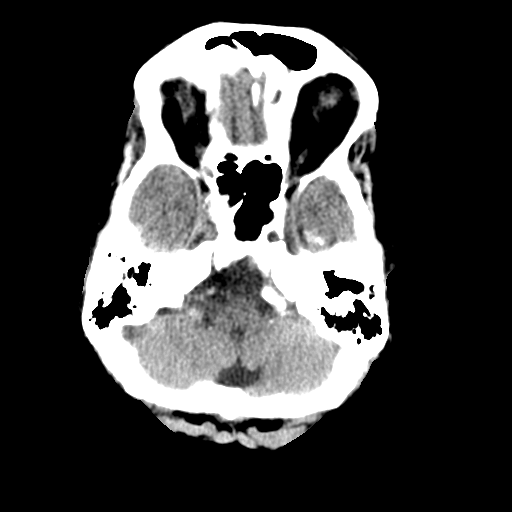
[im 9/30  brain]
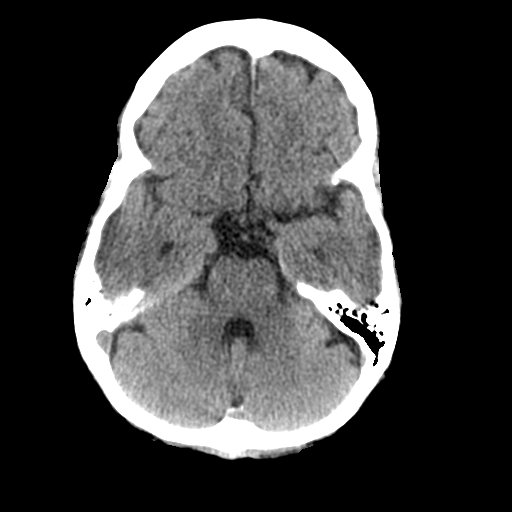
[im 12/30  brain]
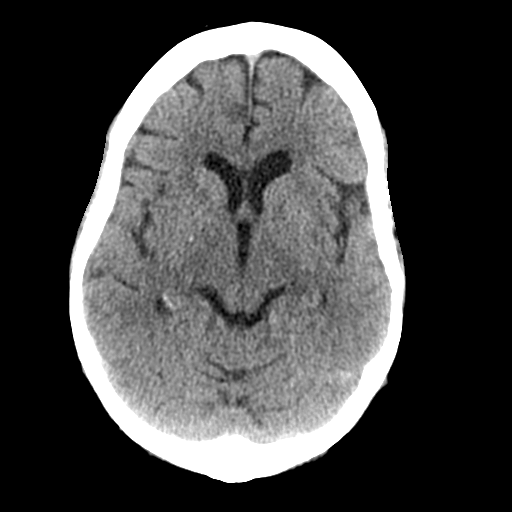
[im 16/30  brain]
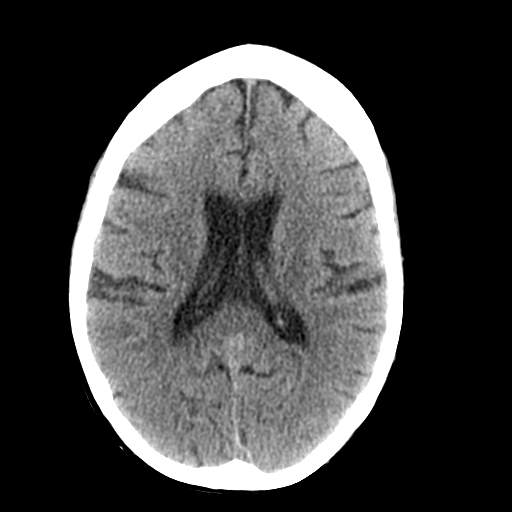
[im 16/30  bone]
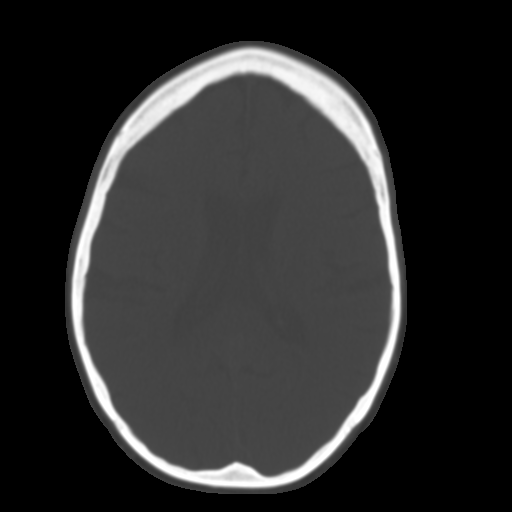
[im 19/30  brain]
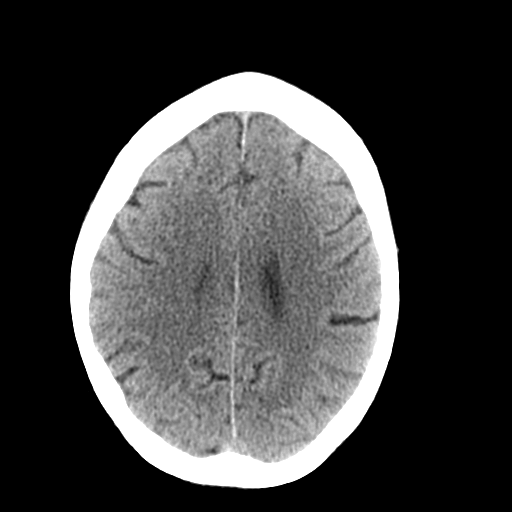
[im 22/30  brain]
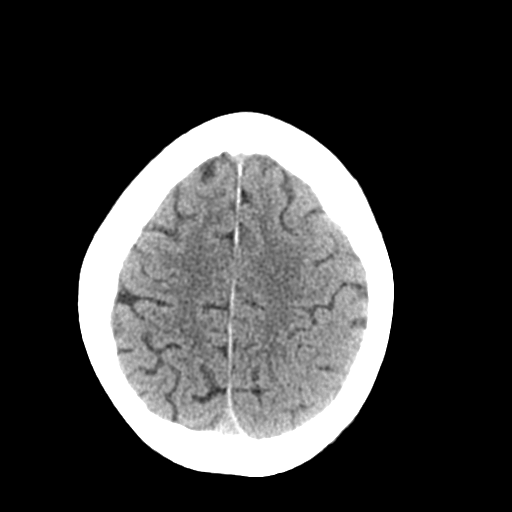
[im 25/30  brain]
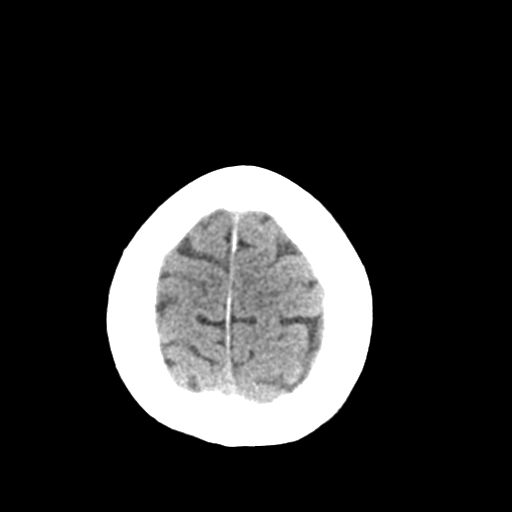
[im 28/30  brain]
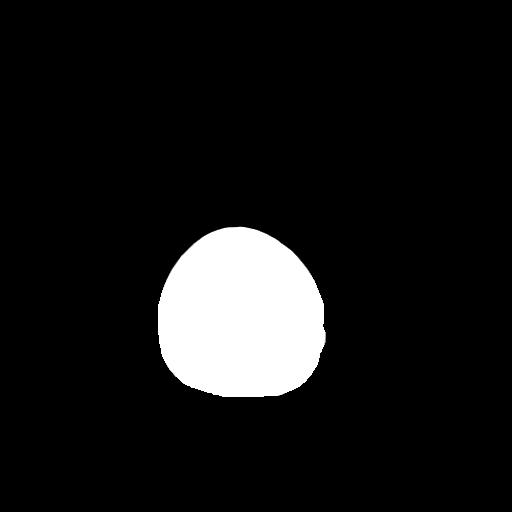
[im 28/30  bone]
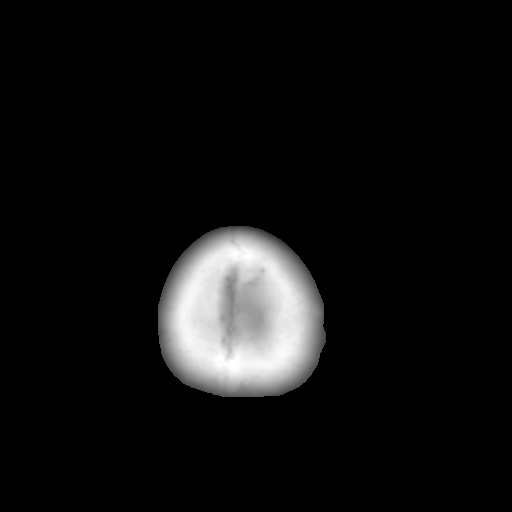

[Series 4: coronal soft · coronal · 0.31mm/px · 3 of 71 slices shown]
[im 24/71  brain]
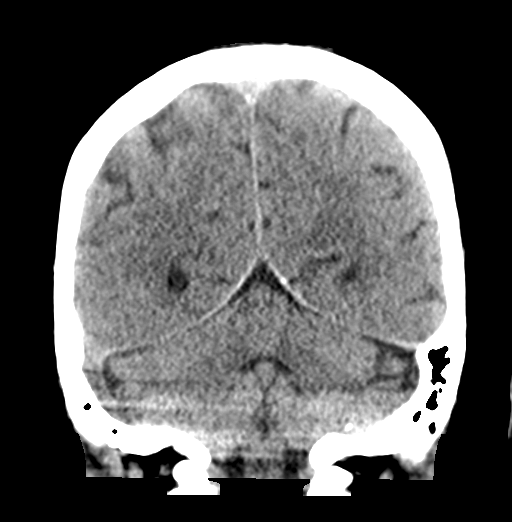
[im 32/71  brain]
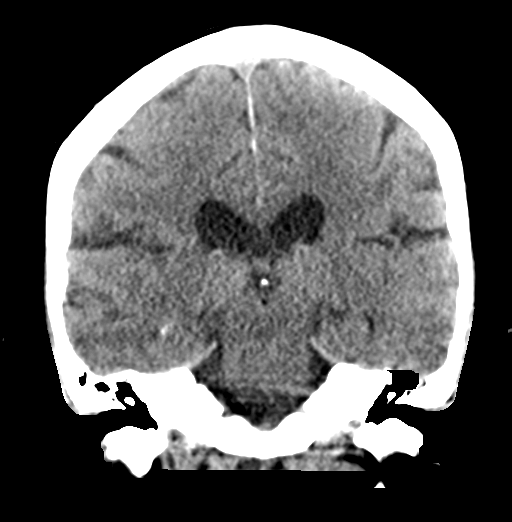
[im 39/71  brain]
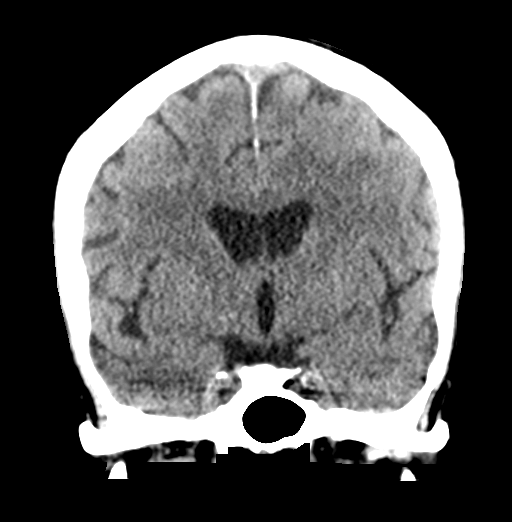

[Series 5: sagittal soft · sagittal · 0.33mm/px · 3 of 50 slices shown]
[im 17/50  brain]
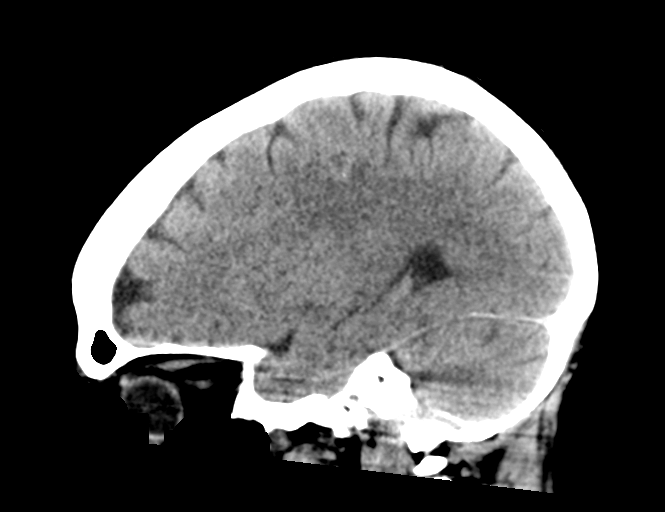
[im 25/50  brain]
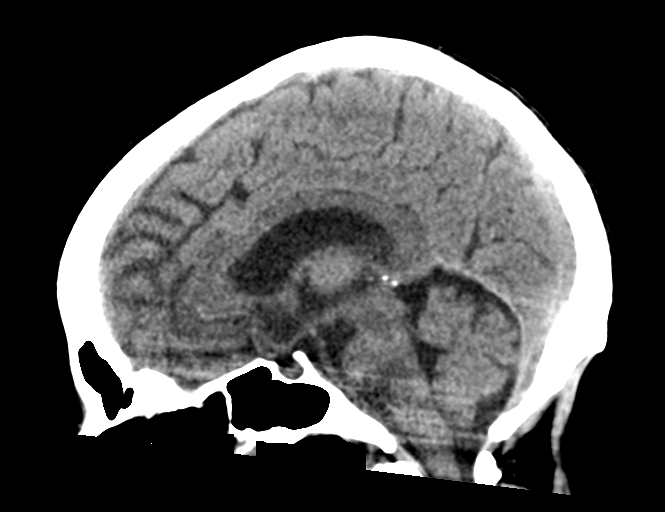
[im 33/50  brain]
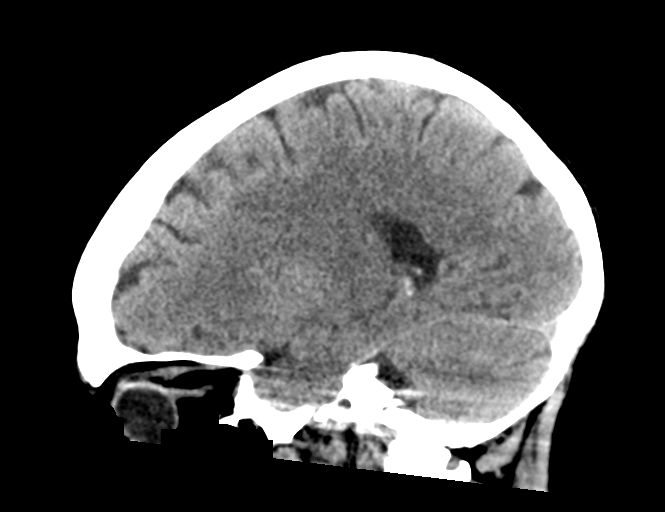

[15 of 47 positions shown; findings below may reference images not displayed]

FINDINGS: Brain:

Please note there is limited assessment for intracranial metastatic
disease on this noncontrast head CT.

Cerebral volume is normal for age.

There is no acute intracranial hemorrhage.

No demarcated cortical infarct.

No extra-axial fluid collection.

No evidence of intracranial mass.

No midline shift.

Vascular: No hyperdense vessel. Atherosclerotic calcifications.

Skull: Normal. Negative for fracture or focal lesion.

Sinuses/Orbits: Visualized orbits show no acute finding. No
significant paranasal sinus disease or mastoid effusion at the
imaged levels.
IMPRESSION: Please note there is limited assessment for intracranial metastatic
disease on this non-contrast head CT.

Unremarkable non-contrast CT appearance of the brain.

No evidence of acute intracranial abnormality.

## 2021-03-09 IMAGING — DX DG CHEST 2V
2 series · 2 of 2 positions shown · non-contrast
Comparison: July 19, 2019

CLINICAL DATA: Weakness.

EXAM:
CHEST - 2 VIEW

[chest pa]
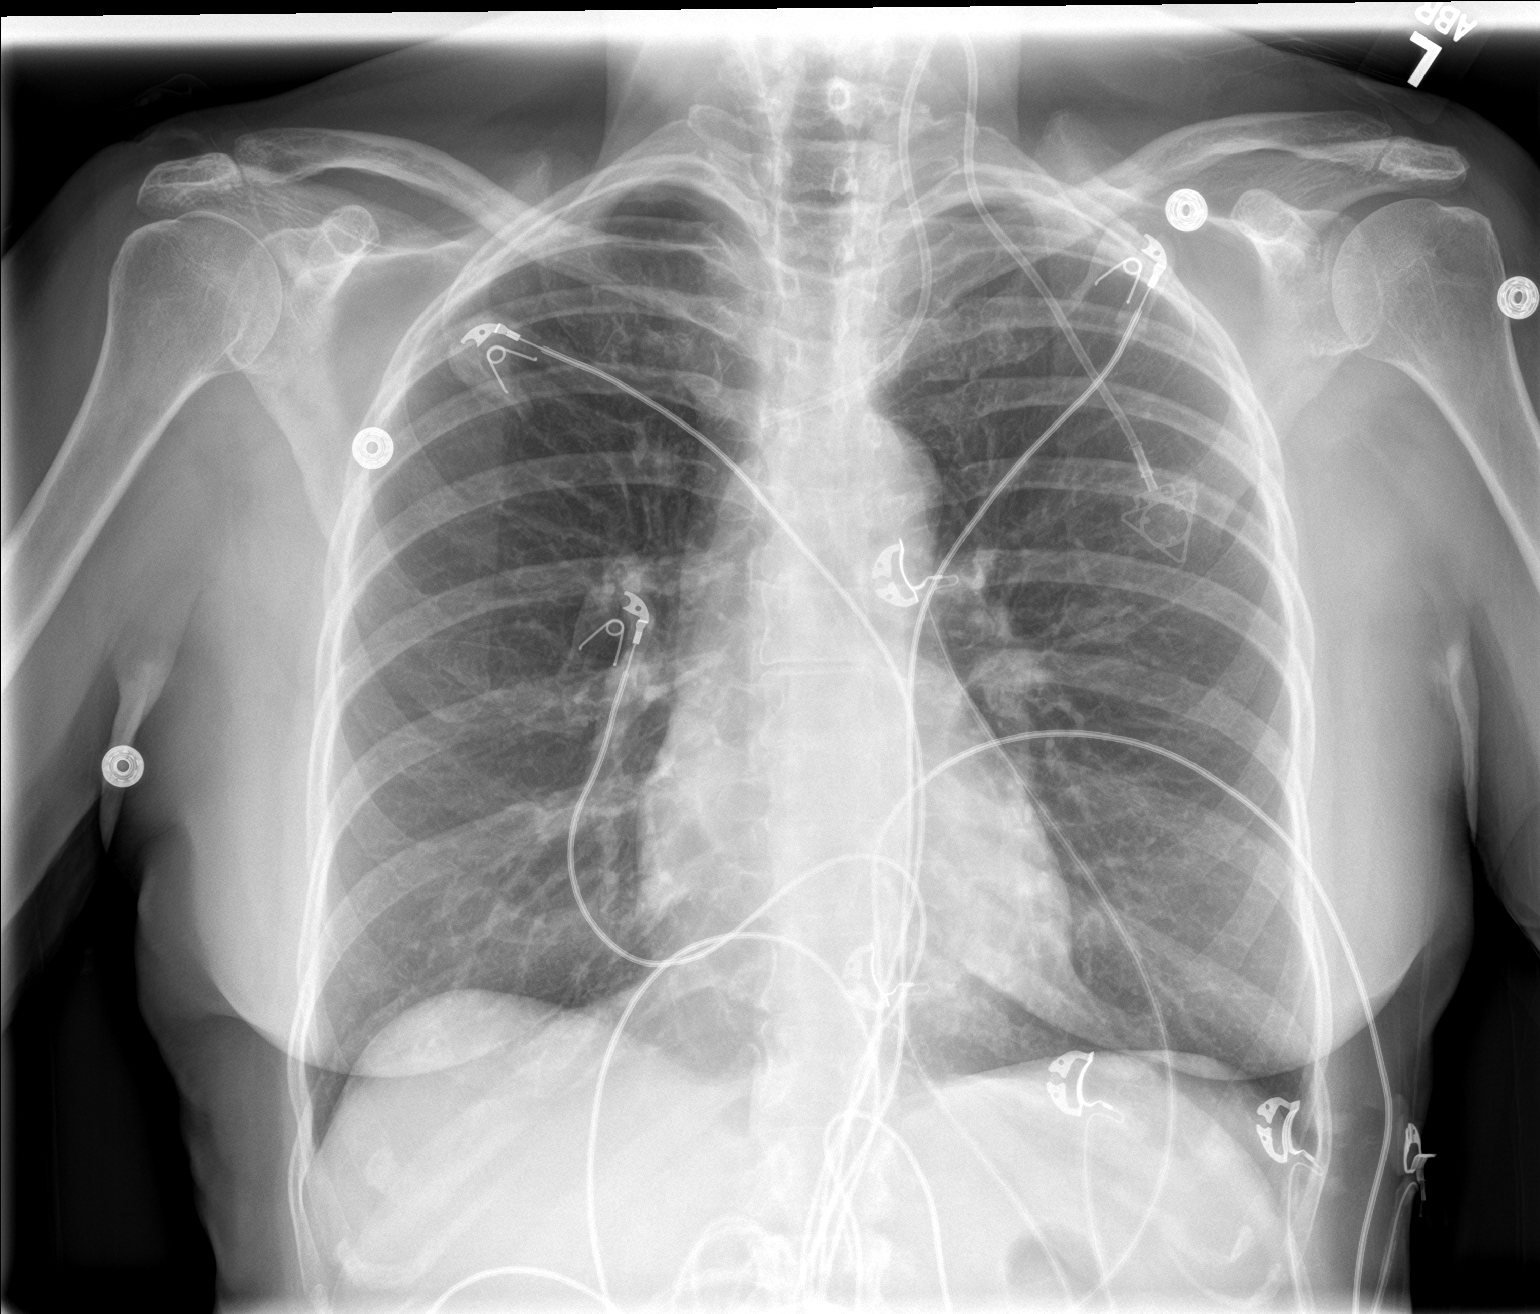

[chest lat]
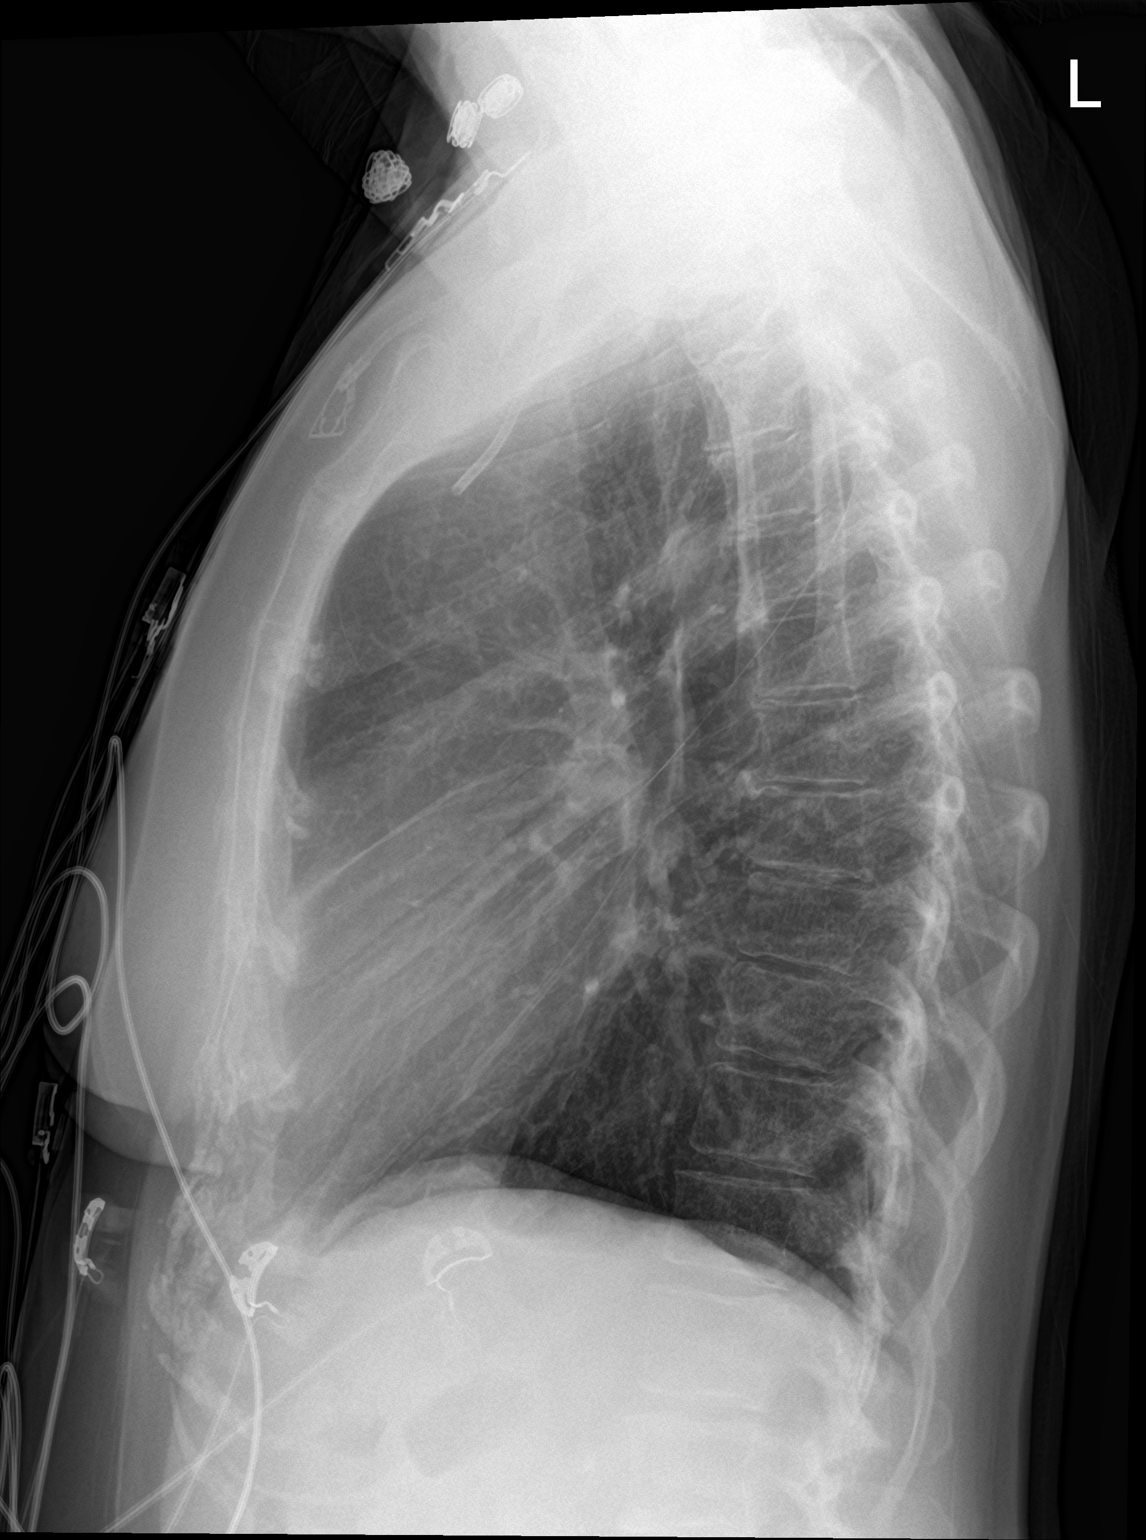

[2 of 2 positions shown; findings below may reference images not displayed]

FINDINGS: There is stable positioning of the left-sided Port-A-Cath. The tip
projects over the left brachiocephalic vein. There is no
pneumothorax or focal infiltrate. The heart size is stable. There is
no significant pleural effusion. The lungs remain slightly
hyperexpanded.
IMPRESSION: No active cardiopulmonary disease.

## 2021-03-10 DIAGNOSIS — E2839 Other primary ovarian failure: Secondary | ICD-10-CM | POA: Diagnosis not present

## 2021-03-10 DIAGNOSIS — Z79899 Other long term (current) drug therapy: Secondary | ICD-10-CM | POA: Diagnosis not present

## 2021-03-10 DIAGNOSIS — M859 Disorder of bone density and structure, unspecified: Secondary | ICD-10-CM | POA: Diagnosis not present

## 2021-06-02 DIAGNOSIS — Z1231 Encounter for screening mammogram for malignant neoplasm of breast: Secondary | ICD-10-CM | POA: Diagnosis not present

## 2021-06-04 ENCOUNTER — Inpatient Hospital Stay (HOSPITAL_COMMUNITY): Payer: Medicare HMO | Attending: Hematology

## 2021-06-04 DIAGNOSIS — C183 Malignant neoplasm of hepatic flexure: Secondary | ICD-10-CM

## 2021-06-04 DIAGNOSIS — Z85038 Personal history of other malignant neoplasm of large intestine: Secondary | ICD-10-CM | POA: Diagnosis not present

## 2021-06-04 DIAGNOSIS — F419 Anxiety disorder, unspecified: Secondary | ICD-10-CM | POA: Diagnosis not present

## 2021-06-04 DIAGNOSIS — Z7982 Long term (current) use of aspirin: Secondary | ICD-10-CM | POA: Diagnosis not present

## 2021-06-04 DIAGNOSIS — Z9221 Personal history of antineoplastic chemotherapy: Secondary | ICD-10-CM | POA: Insufficient documentation

## 2021-06-04 DIAGNOSIS — Z79899 Other long term (current) drug therapy: Secondary | ICD-10-CM | POA: Diagnosis not present

## 2021-06-04 LAB — COMPREHENSIVE METABOLIC PANEL
ALT: 16 U/L (ref 0–44)
AST: 21 U/L (ref 15–41)
Albumin: 4.2 g/dL (ref 3.5–5.0)
Alkaline Phosphatase: 47 U/L (ref 38–126)
Anion gap: 6 (ref 5–15)
BUN: 12 mg/dL (ref 8–23)
CO2: 28 mmol/L (ref 22–32)
Calcium: 9.6 mg/dL (ref 8.9–10.3)
Chloride: 104 mmol/L (ref 98–111)
Creatinine, Ser: 0.98 mg/dL (ref 0.44–1.00)
GFR, Estimated: >60 mL/min (ref >60)
Glucose, Bld: 98 mg/dL (ref 70–99)
Potassium: 4.2 mmol/L (ref 3.5–5.1)
Sodium: 138 mmol/L (ref 135–145)
Total Bilirubin: 1.6 mg/dL — ABNORMAL HIGH (ref 0.3–1.2)
Total Protein: 7.2 g/dL (ref 6.5–8.1)

## 2021-06-04 LAB — CBC WITH DIFFERENTIAL/PLATELET
Abs Immature Granulocytes: 0.01 10*3/uL (ref 0.00–0.07)
Basophils Absolute: 0 10*3/uL (ref 0.0–0.1)
Basophils Relative: 1 %
Eosinophils Absolute: 0.3 10*3/uL (ref 0.0–0.5)
Eosinophils Relative: 5 %
HCT: 35.2 % — ABNORMAL LOW (ref 36.0–46.0)
Hemoglobin: 12.6 g/dL (ref 12.0–15.0)
Immature Granulocytes: 0 %
Lymphocytes Relative: 25 %
Lymphs Abs: 1.6 10*3/uL (ref 0.7–4.0)
MCH: 33.3 pg (ref 26.0–34.0)
MCHC: 35.8 g/dL (ref 30.0–36.0)
MCV: 93.1 fL (ref 80.0–100.0)
Monocytes Absolute: 0.6 10*3/uL (ref 0.1–1.0)
Monocytes Relative: 9 %
Neutro Abs: 3.9 10*3/uL (ref 1.7–7.7)
Neutrophils Relative %: 60 %
Platelets: 163 10*3/uL (ref 150–400)
RBC: 3.78 MIL/uL — ABNORMAL LOW (ref 3.87–5.11)
RDW: 11.8 % (ref 11.5–15.5)
WBC: 6.5 10*3/uL (ref 4.0–10.5)
nRBC: 0 % (ref 0.0–0.2)

## 2021-06-04 LAB — MAGNESIUM: Magnesium: 1.9 mg/dL (ref 1.7–2.4)

## 2021-06-05 LAB — CEA: CEA: 1.5 ng/mL (ref 0.0–4.7)

## 2021-06-11 ENCOUNTER — Inpatient Hospital Stay (HOSPITAL_BASED_OUTPATIENT_CLINIC_OR_DEPARTMENT_OTHER): Payer: Medicare HMO | Admitting: Hematology

## 2021-06-11 ENCOUNTER — Other Ambulatory Visit: Payer: Self-pay

## 2021-06-11 ENCOUNTER — Inpatient Hospital Stay (HOSPITAL_COMMUNITY): Payer: Medicare HMO

## 2021-06-11 ENCOUNTER — Encounter (HOSPITAL_COMMUNITY): Payer: Self-pay

## 2021-06-11 VITALS — BP 144/76 | HR 78 | Temp 98.0°F | Resp 16 | Ht 62.5 in | Wt 138.5 lb

## 2021-06-11 DIAGNOSIS — Z9221 Personal history of antineoplastic chemotherapy: Secondary | ICD-10-CM | POA: Diagnosis not present

## 2021-06-11 DIAGNOSIS — Z95828 Presence of other vascular implants and grafts: Secondary | ICD-10-CM

## 2021-06-11 DIAGNOSIS — C183 Malignant neoplasm of hepatic flexure: Secondary | ICD-10-CM

## 2021-06-11 DIAGNOSIS — C189 Malignant neoplasm of colon, unspecified: Secondary | ICD-10-CM

## 2021-06-11 DIAGNOSIS — Z85038 Personal history of other malignant neoplasm of large intestine: Secondary | ICD-10-CM | POA: Diagnosis not present

## 2021-06-11 DIAGNOSIS — Z79899 Other long term (current) drug therapy: Secondary | ICD-10-CM | POA: Diagnosis not present

## 2021-06-11 DIAGNOSIS — F419 Anxiety disorder, unspecified: Secondary | ICD-10-CM | POA: Diagnosis not present

## 2021-06-11 DIAGNOSIS — Z7982 Long term (current) use of aspirin: Secondary | ICD-10-CM | POA: Diagnosis not present

## 2021-06-11 MED ORDER — SODIUM CHLORIDE 0.9% FLUSH
10.0000 mL | Freq: Once | INTRAVENOUS | Status: AC
  Administered 2021-06-11: 10 mL via INTRAVENOUS

## 2021-06-11 MED ORDER — HEPARIN SOD (PORK) LOCK FLUSH 100 UNIT/ML IV SOLN
500.0000 [IU] | Freq: Once | INTRAVENOUS | Status: AC
  Administered 2021-06-11: 500 [IU] via INTRAVENOUS

## 2021-06-11 NOTE — Progress Notes (Signed)
Patients port flushed without difficulty.  No blood return noted with no bruising or swelling noted at site.  Band aid applied.  VSS with discharge and left in satisfactory condition with no s/s of distress noted.   
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
## 2021-06-11 NOTE — Patient Instructions (Signed)
[facility]  Discharge Instructions: Thank you for choosing [facility] to provide your oncology and hematology care.  If you have a lab appointment with the Cancer Center, please come in thru the Main Entrance and check in at the main information desk.  Wear comfortable clothing and clothing appropriate for easy access to any Portacath or PICC line.   We strive to give you quality time with your provider. You may need to reschedule your appointment if you arrive late (15 or more minutes).  Arriving late affects you and other patients whose appointments are after yours.  Also, if you miss three or more appointments without notifying the office, you may be dismissed from the clinic at the provider's discretion.      For prescription refill requests, have your pharmacy contact our office and allow 72 hours for refills to be completed.        To help prevent nausea and vomiting after your treatment, we encourage you to take your nausea medication as directed.  BELOW ARE SYMPTOMS THAT SHOULD BE REPORTED IMMEDIATELY: *FEVER GREATER THAN 100.4 F (38 C) OR HIGHER *CHILLS OR SWEATING *NAUSEA AND VOMITING THAT IS NOT CONTROLLED WITH YOUR NAUSEA MEDICATION *UNUSUAL SHORTNESS OF BREATH *UNUSUAL BRUISING OR BLEEDING *URINARY PROBLEMS (pain or burning when urinating, or frequent urination) *BOWEL PROBLEMS (unusual diarrhea, constipation, pain near the anus) TENDERNESS IN MOUTH AND THROAT WITH OR WITHOUT PRESENCE OF ULCERS (sore throat, sores in mouth, or a toothache) UNUSUAL RASH, SWELLING OR PAIN  UNUSUAL VAGINAL DISCHARGE OR ITCHING   Items with * indicate a potential emergency and should be followed up as soon as possible or go to the Emergency Department if any problems should occur.  Please show the CHEMOTHERAPY ALERT CARD or IMMUNOTHERAPY ALERT CARD at check-in to the Emergency Department and triage nurse.  Should you have questions after your visit or need to cancel  or reschedule your appointment, please contact [facility] [phone]  and follow the prompts.  Office hours are 8:00 a.m. to 4:30 p.m. Monday - Friday. Please note that voicemails left after 4:00 p.m. may not be returned until the following business day.  We are closed weekends and major holidays. You have access to a nurse at all times for urgent questions. Please call the main number to the clinic [phone] and follow the prompts.  For any non-urgent questions, you may also contact your provider using MyChart. We now offer e-Visits for anyone 18 and older to request care online for non-urgent symptoms. For details visit [email or web address].   Also download the MyChart app! Go to the app store, search "MyChart", open the app, select [place], and log in with your MyChart username and password.  Due to Covid, a mask is required upon entering the hospital/clinic. If you do not have a mask, one will be given to you upon arrival. For doctor visits, patients may have 1 support person aged 18 or older with them. For treatment visits, patients cannot have anyone with them due to current Covid guidelines and our immunocompromised population.  

## 2021-06-15 DIAGNOSIS — H401132 Primary open-angle glaucoma, bilateral, moderate stage: Secondary | ICD-10-CM | POA: Diagnosis not present

## 2021-09-08 ENCOUNTER — Inpatient Hospital Stay (HOSPITAL_COMMUNITY): Payer: Medicare HMO | Attending: Hematology

## 2021-09-08 DIAGNOSIS — Z85038 Personal history of other malignant neoplasm of large intestine: Secondary | ICD-10-CM | POA: Insufficient documentation

## 2021-09-08 DIAGNOSIS — Z452 Encounter for adjustment and management of vascular access device: Secondary | ICD-10-CM | POA: Diagnosis not present

## 2021-09-08 MED ORDER — HEPARIN SOD (PORK) LOCK FLUSH 100 UNIT/ML IV SOLN
500.0000 [IU] | Freq: Once | INTRAVENOUS | Status: AC
  Administered 2021-09-08: 500 [IU] via INTRAVENOUS

## 2021-09-08 MED ORDER — SODIUM CHLORIDE 0.9% FLUSH
10.0000 mL | Freq: Once | INTRAVENOUS | Status: AC
  Administered 2021-09-08: 10 mL via INTRAVENOUS

## 2021-09-08 NOTE — Progress Notes (Signed)
Patients port flushed without difficulty.  No blood return noted with no bruising or swelling noted at site.  Band aid applied.  VSS with discharge and left in satisfactory condition with no s/s of distress noted.   

## 2021-09-08 NOTE — Patient Instructions (Signed)
[facility]  Discharge Instructions: °Thank you for choosing [facility] to provide your oncology and hematology care.  °If you have a lab appointment with the Cancer Center, please come in thru the Main Entrance and check in at the main information desk. ° °Wear comfortable clothing and clothing appropriate for easy access to any Portacath or PICC line.  ° °We strive to give you quality time with your provider. You may need to reschedule your appointment if you arrive late (15 or more minutes).  Arriving late affects you and other patients whose appointments are after yours.  Also, if you miss three or more appointments without notifying the office, you may be dismissed from the clinic at the provider’s discretion.    °  °For prescription refill requests, have your pharmacy contact our office and allow 72 hours for refills to be completed.   ° °Today you received the following chemotherapy and/or immunotherapy agents PORT flush    °  °To help prevent nausea and vomiting after your treatment, we encourage you to take your nausea medication as directed. ° °BELOW ARE SYMPTOMS THAT SHOULD BE REPORTED IMMEDIATELY: °*FEVER GREATER THAN 100.4 F (38 °C) OR HIGHER °*CHILLS OR SWEATING °*NAUSEA AND VOMITING THAT IS NOT CONTROLLED WITH YOUR NAUSEA MEDICATION °*UNUSUAL SHORTNESS OF BREATH °*UNUSUAL BRUISING OR BLEEDING °*URINARY PROBLEMS (pain or burning when urinating, or frequent urination) °*BOWEL PROBLEMS (unusual diarrhea, constipation, pain near the anus) °TENDERNESS IN MOUTH AND THROAT WITH OR WITHOUT PRESENCE OF ULCERS (sore throat, sores in mouth, or a toothache) °UNUSUAL RASH, SWELLING OR PAIN  °UNUSUAL VAGINAL DISCHARGE OR ITCHING  ° °Items with * indicate a potential emergency and should be followed up as soon as possible or go to the Emergency Department if any problems should occur. ° °Please show the CHEMOTHERAPY ALERT CARD or IMMUNOTHERAPY ALERT CARD at check-in to the Emergency  Department and triage nurse. ° °Should you have questions after your visit or need to cancel or reschedule your appointment, please contact [facility] [phone]  and follow the prompts.  Office hours are 8:00 a.m. to 4:30 p.m. Monday - Friday. Please note that voicemails left after 4:00 p.m. may not be returned until the following business day.  We are closed weekends and major holidays. You have access to a nurse at all times for urgent questions. Please call the main number to the clinic [phone] and follow the prompts. ° °For any non-urgent questions, you may also contact your provider using MyChart. We now offer e-Visits for anyone 18 and older to request care online for non-urgent symptoms. For details visit [email or web address]. °  °Also download the MyChart app! Go to the app store, search "MyChart", open the app, select [facility], and log in with your MyChart username and password. ° °Due to Covid, a mask is required upon entering the hospital/clinic. If you do not have a mask, one will be given to you upon arrival. For doctor visits, patients may have 1 support person aged 18 or older with them. For treatment visits, patients cannot have anyone with them due to current Covid guidelines and our immunocompromised population.  °

## 2021-11-16 ENCOUNTER — Other Ambulatory Visit: Payer: Self-pay

## 2021-11-19 ENCOUNTER — Other Ambulatory Visit: Payer: Self-pay

## 2021-11-24 ENCOUNTER — Other Ambulatory Visit: Payer: Self-pay

## 2021-12-06 ENCOUNTER — Other Ambulatory Visit: Payer: Self-pay

## 2021-12-09 ENCOUNTER — Ambulatory Visit (HOSPITAL_COMMUNITY)
Admission: RE | Admit: 2021-12-09 | Discharge: 2021-12-09 | Disposition: A | Discharge status: Home / Self Care | Payer: Medicare HMO | Source: Ambulatory Visit | Attending: Hematology | Admitting: Hematology

## 2021-12-09 ENCOUNTER — Inpatient Hospital Stay: Payer: Medicare HMO | Attending: Hematology

## 2021-12-09 DIAGNOSIS — C183 Malignant neoplasm of hepatic flexure: Secondary | ICD-10-CM | POA: Insufficient documentation

## 2021-12-09 DIAGNOSIS — Z452 Encounter for adjustment and management of vascular access device: Secondary | ICD-10-CM | POA: Insufficient documentation

## 2021-12-09 DIAGNOSIS — Z08 Encounter for follow-up examination after completed treatment for malignant neoplasm: Secondary | ICD-10-CM | POA: Insufficient documentation

## 2021-12-09 DIAGNOSIS — Z85038 Personal history of other malignant neoplasm of large intestine: Secondary | ICD-10-CM | POA: Insufficient documentation

## 2021-12-09 DIAGNOSIS — F419 Anxiety disorder, unspecified: Secondary | ICD-10-CM | POA: Insufficient documentation

## 2021-12-09 LAB — COMPREHENSIVE METABOLIC PANEL
ALT: 16 U/L (ref 0–44)
AST: 23 U/L (ref 15–41)
Albumin: 4.4 g/dL (ref 3.5–5.0)
Alkaline Phosphatase: 44 U/L (ref 38–126)
Anion gap: 5 (ref 5–15)
BUN: 12 mg/dL (ref 8–23)
CO2: 28 mmol/L (ref 22–32)
Calcium: 9.7 mg/dL (ref 8.9–10.3)
Chloride: 106 mmol/L (ref 98–111)
Creatinine, Ser: 1.06 mg/dL — ABNORMAL HIGH (ref 0.44–1.00)
GFR, Estimated: 55 mL/min — ABNORMAL LOW (ref >60)
Glucose, Bld: 96 mg/dL (ref 70–99)
Potassium: 4.7 mmol/L (ref 3.5–5.1)
Sodium: 139 mmol/L (ref 135–145)
Total Bilirubin: 1.7 mg/dL — ABNORMAL HIGH (ref 0.3–1.2)
Total Protein: 7.5 g/dL (ref 6.5–8.1)

## 2021-12-09 LAB — CBC WITH DIFFERENTIAL/PLATELET
Abs Immature Granulocytes: 0.01 10*3/uL (ref 0.00–0.07)
Basophils Absolute: 0.1 10*3/uL (ref 0.0–0.1)
Basophils Relative: 1 %
Eosinophils Absolute: 0.2 10*3/uL (ref 0.0–0.5)
Eosinophils Relative: 4 %
HCT: 36.8 % (ref 36.0–46.0)
Hemoglobin: 13.1 g/dL (ref 12.0–15.0)
Immature Granulocytes: 0 %
Lymphocytes Relative: 30 %
Lymphs Abs: 1.4 10*3/uL (ref 0.7–4.0)
MCH: 32 pg (ref 26.0–34.0)
MCHC: 35.6 g/dL (ref 30.0–36.0)
MCV: 89.8 fL (ref 80.0–100.0)
Monocytes Absolute: 0.4 10*3/uL (ref 0.1–1.0)
Monocytes Relative: 8 %
Neutro Abs: 2.6 10*3/uL (ref 1.7–7.7)
Neutrophils Relative %: 57 %
Platelets: 181 10*3/uL (ref 150–400)
RBC: 4.1 MIL/uL (ref 3.87–5.11)
RDW: 11.8 % (ref 11.5–15.5)
WBC: 4.6 10*3/uL (ref 4.0–10.5)
nRBC: 0 % (ref 0.0–0.2)

## 2021-12-09 LAB — POCT I-STAT CREATININE: Creatinine, Ser: 1.2 mg/dL — ABNORMAL HIGH (ref 0.44–1.00)

## 2021-12-09 MED ORDER — IOHEXOL 300 MG/ML  SOLN
100.0000 mL | Freq: Once | INTRAMUSCULAR | Status: AC | PRN
  Administered 2021-12-09: 100 mL via INTRAVENOUS

## 2021-12-11 LAB — CEA: CEA: 1.7 ng/mL (ref 0.0–4.7)

## 2021-12-15 DIAGNOSIS — H401132 Primary open-angle glaucoma, bilateral, moderate stage: Secondary | ICD-10-CM | POA: Diagnosis not present

## 2021-12-16 ENCOUNTER — Inpatient Hospital Stay (HOSPITAL_BASED_OUTPATIENT_CLINIC_OR_DEPARTMENT_OTHER): Payer: Medicare HMO | Admitting: Hematology

## 2021-12-16 ENCOUNTER — Inpatient Hospital Stay: Payer: Medicare HMO

## 2021-12-16 ENCOUNTER — Encounter: Payer: Self-pay | Admitting: Hematology

## 2021-12-16 ENCOUNTER — Other Ambulatory Visit: Payer: Self-pay | Admitting: *Deleted

## 2021-12-16 VITALS — BP 159/82 | HR 78 | Temp 98.0°F | Resp 18 | Wt 138.3 lb

## 2021-12-16 DIAGNOSIS — Z95828 Presence of other vascular implants and grafts: Secondary | ICD-10-CM

## 2021-12-16 DIAGNOSIS — C183 Malignant neoplasm of hepatic flexure: Secondary | ICD-10-CM

## 2021-12-16 DIAGNOSIS — F419 Anxiety disorder, unspecified: Secondary | ICD-10-CM | POA: Diagnosis not present

## 2021-12-16 DIAGNOSIS — C189 Malignant neoplasm of colon, unspecified: Secondary | ICD-10-CM | POA: Diagnosis not present

## 2021-12-16 DIAGNOSIS — Z452 Encounter for adjustment and management of vascular access device: Secondary | ICD-10-CM | POA: Diagnosis not present

## 2021-12-16 MED ORDER — SODIUM CHLORIDE 0.9% FLUSH
10.0000 mL | Freq: Once | INTRAVENOUS | Status: AC
  Administered 2021-12-16: 10 mL via INTRAVENOUS

## 2021-12-16 MED ORDER — HEPARIN SOD (PORK) LOCK FLUSH 100 UNIT/ML IV SOLN
500.0000 [IU] | Freq: Once | INTRAVENOUS | Status: AC
  Administered 2021-12-16: 500 [IU] via INTRAVENOUS

## 2021-12-16 MED ORDER — SODIUM CHLORIDE FLUSH 0.9 % IV SOLN: 10.0000 mL | Freq: Once | INTRAVENOUS | Status: DC

## 2021-12-16 NOTE — Patient Instructions (Addendum)
[place] at [facility] Discharge Instructions   You were seen and examined today by Dr. Delton Coombes.  He reviewed the results of your lab work which are normal.  He reviewed your CT scan which shows no evidence of cancer.   We will see you back in 6 months. We will repeat labs prior to your next visit.      Thank you for choosing [place] at [facility] to provide your oncology and hematology care.  To afford each patient quality time with our provider, please arrive at least 15 minutes before your scheduled appointment time.   If you have a lab appointment with the [facility] please come in thru the Main Entrance and check in at the main information desk.  You need to re-schedule your appointment should you arrive 10 or more minutes late.  We strive to give you quality time with our providers, and arriving late affects you and other patients whose appointments are after yours.  Also, if you no show three or more times for appointments you may be dismissed from the clinic at the providers discretion.     Again, thank you for choosing [facility].  Our hope is that these requests will decrease the amount of time that you wait before being seen by our physicians.       _____________________________________________________________  Should you have questions after your visit to [facility], please contact our office at [phone] and follow the prompts.  Our office hours are 8:00 a.m. and 4:30 p.m. Monday - Friday.  Please note that voicemails left after 4:00 p.m. may not be returned until the following business day.  We are closed weekends and major holidays.  You do have access to a nurse 24-7, just call the main number to the clinic [phone] and do not press any options, hold on the line and a nurse will answer the phone.    For prescription refill requests, have your pharmacy contact our office and allow  72 hours.    Due to Covid, you will need to wear a mask upon entering the hospital. If you do not have a mask, a mask will be given to you at the Main Entrance upon arrival. For doctor visits, patients may have 1 support person age 75 or older with them. For treatment visits, patients can not have anyone with them due to social distancing guidelines and our immunocompromised population.
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
## 2021-12-16 NOTE — Patient Instructions (Signed)
[facility] [place]  Discharge Instructions: Thank you for choosing [facility] to provide your oncology and hematology care.  If you have a lab appointment with the Cancer Center, please come in thru the Main Entrance and check in at the main information desk.  Wear comfortable clothing and clothing appropriate for easy access to any Portacath or PICC line.   We strive to give you quality time with your provider. You may need to reschedule your appointment if you arrive late (15 or more minutes).  Arriving late affects you and other patients whose appointments are after yours.  Also, if you miss three or more appointments without notifying the office, you may be dismissed from the clinic at the provider's discretion.      For prescription refill requests, have your pharmacy contact our office and allow 72 hours for refills to be completed.     To help prevent nausea and vomiting after your treatment, we encourage you to take your nausea medication as directed.  BELOW ARE SYMPTOMS THAT SHOULD BE REPORTED IMMEDIATELY: *FEVER GREATER THAN 100.4 F (38 C) OR HIGHER *CHILLS OR SWEATING *NAUSEA AND VOMITING THAT IS NOT CONTROLLED WITH YOUR NAUSEA MEDICATION *UNUSUAL SHORTNESS OF BREATH *UNUSUAL BRUISING OR BLEEDING *URINARY PROBLEMS (pain or burning when urinating, or frequent urination) *BOWEL PROBLEMS (unusual diarrhea, constipation, pain near the anus) TENDERNESS IN MOUTH AND THROAT WITH OR WITHOUT PRESENCE OF ULCERS (sore throat, sores in mouth, or a toothache) UNUSUAL RASH, SWELLING OR PAIN  UNUSUAL VAGINAL DISCHARGE OR ITCHING   Items with * indicate a potential emergency and should be followed up as soon as possible or go to the Emergency Department if any problems should occur.  Please show the CHEMOTHERAPY ALERT CARD or IMMUNOTHERAPY ALERT CARD at check-in to the Emergency Department and triage nurse.  Should you have questions after your visit or need to  cancel or reschedule your appointment, please contact [facility] [place] [phone]  and follow the prompts.  Office hours are 8:00 a.m. to 4:30 p.m. Monday - Friday. Please note that voicemails left after 4:00 p.m. may not be returned until the following business day.  We are closed weekends and major holidays. You have access to a nurse at all times for urgent questions. Please call the main number to the clinic [phone] and follow the prompts.  For any non-urgent questions, you may also contact your provider using MyChart. We now offer e-Visits for anyone 18 and older to request care online for non-urgent symptoms. For details visit [email or web address].   Also download the MyChart app! Go to the app store, search "MyChart", open the app, select [place], and log in with your MyChart username and password.  Masks are optional in the cancer centers. If you would like for your care team to wear a mask while they are taking care of you, please let them know. You may have one support person who is at least 76 years old accompany you for your appointments.  

## 2021-12-16 NOTE — Progress Notes (Signed)
Patients port flushed without difficulty.  No blood return noted with no bruising or swelling noted at site.  Band aid applied.  VSS with discharge and left in satisfactory condition with no s/s of distress noted.   

## 2021-12-17 ENCOUNTER — Other Ambulatory Visit: Payer: Self-pay

## 2022-03-10 DIAGNOSIS — Z6825 Body mass index (BMI) 25.0-25.9, adult: Secondary | ICD-10-CM | POA: Diagnosis not present

## 2022-03-10 DIAGNOSIS — Z1331 Encounter for screening for depression: Secondary | ICD-10-CM | POA: Diagnosis not present

## 2022-03-10 DIAGNOSIS — I700 Atherosclerosis of aorta: Secondary | ICD-10-CM | POA: Diagnosis not present

## 2022-03-10 DIAGNOSIS — E7800 Pure hypercholesterolemia, unspecified: Secondary | ICD-10-CM | POA: Diagnosis not present

## 2022-03-10 DIAGNOSIS — Z299 Encounter for prophylactic measures, unspecified: Secondary | ICD-10-CM | POA: Diagnosis not present

## 2022-03-10 DIAGNOSIS — Z79899 Other long term (current) drug therapy: Secondary | ICD-10-CM | POA: Diagnosis not present

## 2022-03-10 DIAGNOSIS — R5383 Other fatigue: Secondary | ICD-10-CM | POA: Diagnosis not present

## 2022-03-10 DIAGNOSIS — E039 Hypothyroidism, unspecified: Secondary | ICD-10-CM | POA: Diagnosis not present

## 2022-03-10 DIAGNOSIS — Z2821 Immunization not carried out because of patient refusal: Secondary | ICD-10-CM | POA: Diagnosis not present

## 2022-03-10 DIAGNOSIS — N183 Chronic kidney disease, stage 3 unspecified: Secondary | ICD-10-CM | POA: Diagnosis not present

## 2022-03-10 DIAGNOSIS — Z789 Other specified health status: Secondary | ICD-10-CM | POA: Diagnosis not present

## 2022-03-10 DIAGNOSIS — Z7189 Other specified counseling: Secondary | ICD-10-CM | POA: Diagnosis not present

## 2022-03-10 DIAGNOSIS — Z1339 Encounter for screening examination for other mental health and behavioral disorders: Secondary | ICD-10-CM | POA: Diagnosis not present

## 2022-03-10 DIAGNOSIS — Z0000 Encounter for general adult medical examination without abnormal findings: Secondary | ICD-10-CM | POA: Diagnosis not present

## 2022-03-15 ENCOUNTER — Encounter (INDEPENDENT_AMBULATORY_CARE_PROVIDER_SITE_OTHER): Payer: Self-pay | Admitting: *Deleted

## 2022-03-16 ENCOUNTER — Inpatient Hospital Stay: Payer: Medicare HMO | Attending: Hematology

## 2022-03-16 ENCOUNTER — Inpatient Hospital Stay: Payer: Medicare HMO

## 2022-03-16 VITALS — BP 139/67 | HR 66 | Temp 98.0°F | Resp 18

## 2022-03-16 DIAGNOSIS — C183 Malignant neoplasm of hepatic flexure: Secondary | ICD-10-CM | POA: Insufficient documentation

## 2022-03-16 DIAGNOSIS — Z452 Encounter for adjustment and management of vascular access device: Secondary | ICD-10-CM | POA: Diagnosis not present

## 2022-03-16 DIAGNOSIS — Z95828 Presence of other vascular implants and grafts: Secondary | ICD-10-CM

## 2022-03-16 MED ORDER — SODIUM CHLORIDE 0.9% FLUSH
10.0000 mL | Freq: Once | INTRAVENOUS | Status: AC
  Administered 2022-03-16: 10 mL via INTRAVENOUS

## 2022-03-16 MED ORDER — HEPARIN SOD (PORK) LOCK FLUSH 100 UNIT/ML IV SOLN
500.0000 [IU] | Freq: Once | INTRAVENOUS | Status: AC
  Administered 2022-03-16: 500 [IU] via INTRAVENOUS

## 2022-03-16 NOTE — Patient Instructions (Signed)
[facility] [place]  Discharge Instructions: Thank you for choosing [facility] to provide your oncology and hematology care.  If you have a lab appointment with the Cancer Center, please come in thru the Main Entrance and check in at the main information desk.  Wear comfortable clothing and clothing appropriate for easy access to any Portacath or PICC line.   We strive to give you quality time with your provider. You may need to reschedule your appointment if you arrive late (15 or more minutes).  Arriving late affects you and other patients whose appointments are after yours.  Also, if you miss three or more appointments without notifying the office, you may be dismissed from the clinic at the provider's discretion.      For prescription refill requests, have your pharmacy contact our office and allow 72 hours for refills to be completed.    Today you received the following port flush, return as scheduled.  To help prevent nausea and vomiting after your treatment, we encourage you to take your nausea medication as directed.  BELOW ARE SYMPTOMS THAT SHOULD BE REPORTED IMMEDIATELY: *FEVER GREATER THAN 100.4 F (38 C) OR HIGHER *CHILLS OR SWEATING *NAUSEA AND VOMITING THAT IS NOT CONTROLLED WITH YOUR NAUSEA MEDICATION *UNUSUAL SHORTNESS OF BREATH *UNUSUAL BRUISING OR BLEEDING *URINARY PROBLEMS (pain or burning when urinating, or frequent urination) *BOWEL PROBLEMS (unusual diarrhea, constipation, pain near the anus) TENDERNESS IN MOUTH AND THROAT WITH OR WITHOUT PRESENCE OF ULCERS (sore throat, sores in mouth, or a toothache) UNUSUAL RASH, SWELLING OR PAIN  UNUSUAL VAGINAL DISCHARGE OR ITCHING   Items with * indicate a potential emergency and should be followed up as soon as possible or go to the Emergency Department if any problems should occur.  Please show the CHEMOTHERAPY ALERT CARD or IMMUNOTHERAPY ALERT CARD at check-in to the Emergency Department and  triage nurse.  Should you have questions after your visit or need to cancel or reschedule your appointment, please contact [facility] [place] [phone]  and follow the prompts.  Office hours are 8:00 a.m. to 4:30 p.m. Monday - Friday. Please note that voicemails left after 4:00 p.m. may not be returned until the following business day.  We are closed weekends and major holidays. You have access to a nurse at all times for urgent questions. Please call the main number to the clinic [phone] and follow the prompts.  For any non-urgent questions, you may also contact your provider using MyChart. We now offer e-Visits for anyone 18 and older to request care online for non-urgent symptoms. For details visit [email or web address].   Also download the MyChart app! Go to the app store, search "MyChart", open the app, select [place], and log in with your MyChart username and password.  Masks are optional in the cancer centers. If you would like for your care team to wear a mask while they are taking care of you, please let them know. You may have one support person who is at least 76 years old accompany you for your appointments.  

## 2022-03-16 NOTE — Progress Notes (Signed)
Port flushed with no blood return noted. No bruising or swelling at site. Bandaid applied and patient discharged in satisfactory condition. VVS stable with no signs or symptoms of distressed noted.  

## 2022-03-23 ENCOUNTER — Inpatient Hospital Stay: Payer: Medicare HMO

## 2022-05-31 ENCOUNTER — Encounter (INDEPENDENT_AMBULATORY_CARE_PROVIDER_SITE_OTHER): Payer: Self-pay | Admitting: Gastroenterology

## 2022-05-31 ENCOUNTER — Ambulatory Visit (INDEPENDENT_AMBULATORY_CARE_PROVIDER_SITE_OTHER): Payer: Medicare HMO | Admitting: Gastroenterology

## 2022-05-31 VITALS — BP 143/78 | HR 62 | Temp 97.3°F | Ht 62.0 in | Wt 136.0 lb

## 2022-05-31 DIAGNOSIS — R152 Fecal urgency: Secondary | ICD-10-CM | POA: Diagnosis not present

## 2022-05-31 DIAGNOSIS — C183 Malignant neoplasm of hepatic flexure: Secondary | ICD-10-CM | POA: Diagnosis not present

## 2022-05-31 NOTE — Patient Instructions (Addendum)
Take Metamucil every day Follow up with Dr. Delton Coombes Please call us back to schedule your surveillance colonoscopy once you have made a decision The patient was found to have elevated blood pressure when vital signs were checked in the office. The blood pressure was rechecked by the nursing staff and it was found be persistently elevated >140/90 mmHg. I personally advised to the patient to follow up closely with PCP for hypertension control.
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
## 2022-06-01 DIAGNOSIS — Z9049 Acquired absence of other specified parts of digestive tract: Secondary | ICD-10-CM | POA: Diagnosis not present

## 2022-06-01 DIAGNOSIS — I700 Atherosclerosis of aorta: Secondary | ICD-10-CM | POA: Diagnosis not present

## 2022-06-01 DIAGNOSIS — M47817 Spondylosis without myelopathy or radiculopathy, lumbosacral region: Secondary | ICD-10-CM | POA: Diagnosis not present

## 2022-06-01 DIAGNOSIS — R109 Unspecified abdominal pain: Secondary | ICD-10-CM | POA: Diagnosis not present

## 2022-06-01 DIAGNOSIS — R519 Headache, unspecified: Secondary | ICD-10-CM | POA: Diagnosis not present

## 2022-06-01 DIAGNOSIS — T1490XA Injury, unspecified, initial encounter: Secondary | ICD-10-CM | POA: Diagnosis not present

## 2022-06-01 DIAGNOSIS — S3991XA Unspecified injury of abdomen, initial encounter: Secondary | ICD-10-CM | POA: Diagnosis not present

## 2022-06-01 DIAGNOSIS — S299XXA Unspecified injury of thorax, initial encounter: Secondary | ICD-10-CM | POA: Diagnosis not present

## 2022-06-01 DIAGNOSIS — M545 Low back pain, unspecified: Secondary | ICD-10-CM | POA: Diagnosis not present

## 2022-06-01 DIAGNOSIS — Z041 Encounter for examination and observation following transport accident: Secondary | ICD-10-CM | POA: Diagnosis not present

## 2022-06-01 DIAGNOSIS — Z885 Allergy status to narcotic agent status: Secondary | ICD-10-CM | POA: Diagnosis not present

## 2022-06-01 DIAGNOSIS — M542 Cervicalgia: Secondary | ICD-10-CM | POA: Diagnosis not present

## 2022-06-01 DIAGNOSIS — M25561 Pain in right knee: Secondary | ICD-10-CM | POA: Diagnosis not present

## 2022-06-01 DIAGNOSIS — M8588 Other specified disorders of bone density and structure, other site: Secondary | ICD-10-CM | POA: Diagnosis not present

## 2022-06-01 DIAGNOSIS — Z90710 Acquired absence of both cervix and uterus: Secondary | ICD-10-CM | POA: Diagnosis not present

## 2022-06-01 DIAGNOSIS — N3289 Other specified disorders of bladder: Secondary | ICD-10-CM | POA: Diagnosis not present

## 2022-06-03 DIAGNOSIS — Z1231 Encounter for screening mammogram for malignant neoplasm of breast: Secondary | ICD-10-CM | POA: Diagnosis not present

## 2022-06-03 NOTE — Addendum Note (Signed)
Addended by: Harvel Quale on: 06/03/2022 03:07 PM   Modules accepted: Level of Service

## 2022-06-10 ENCOUNTER — Inpatient Hospital Stay: Payer: Medicare HMO | Attending: Hematology

## 2022-06-10 DIAGNOSIS — D649 Anemia, unspecified: Secondary | ICD-10-CM | POA: Insufficient documentation

## 2022-06-10 DIAGNOSIS — Z85038 Personal history of other malignant neoplasm of large intestine: Secondary | ICD-10-CM | POA: Diagnosis not present

## 2022-06-10 DIAGNOSIS — C183 Malignant neoplasm of hepatic flexure: Secondary | ICD-10-CM

## 2022-06-10 LAB — CBC WITH DIFFERENTIAL/PLATELET
Abs Immature Granulocytes: 0.01 10*3/uL (ref 0.00–0.07)
Basophils Absolute: 0 10*3/uL (ref 0.0–0.1)
Basophils Relative: 1 %
Eosinophils Absolute: 0.2 10*3/uL (ref 0.0–0.5)
Eosinophils Relative: 4 %
HCT: 33.5 % — ABNORMAL LOW (ref 36.0–46.0)
Hemoglobin: 11.9 g/dL — ABNORMAL LOW (ref 12.0–15.0)
Immature Granulocytes: 0 %
Lymphocytes Relative: 32 %
Lymphs Abs: 1.5 10*3/uL (ref 0.7–4.0)
MCH: 32.3 pg (ref 26.0–34.0)
MCHC: 35.5 g/dL (ref 30.0–36.0)
MCV: 91 fL (ref 80.0–100.0)
Monocytes Absolute: 0.4 10*3/uL (ref 0.1–1.0)
Monocytes Relative: 8 %
Neutro Abs: 2.6 10*3/uL (ref 1.7–7.7)
Neutrophils Relative %: 55 %
Platelets: 172 10*3/uL (ref 150–400)
RBC: 3.68 MIL/uL — ABNORMAL LOW (ref 3.87–5.11)
RDW: 11.8 % (ref 11.5–15.5)
WBC: 4.7 10*3/uL (ref 4.0–10.5)
nRBC: 0 % (ref 0.0–0.2)

## 2022-06-10 LAB — COMPREHENSIVE METABOLIC PANEL WITH GFR
ALT: 16 U/L (ref 0–44)
AST: 22 U/L (ref 15–41)
Albumin: 4.1 g/dL (ref 3.5–5.0)
Alkaline Phosphatase: 45 U/L (ref 38–126)
Anion gap: 7 (ref 5–15)
BUN: 12 mg/dL (ref 8–23)
CO2: 26 mmol/L (ref 22–32)
Calcium: 9.1 mg/dL (ref 8.9–10.3)
Chloride: 104 mmol/L (ref 98–111)
Creatinine, Ser: 0.95 mg/dL (ref 0.44–1.00)
GFR, Estimated: >60 mL/min
Glucose, Bld: 98 mg/dL (ref 70–99)
Potassium: 4.4 mmol/L (ref 3.5–5.1)
Sodium: 137 mmol/L (ref 135–145)
Total Bilirubin: 1.8 mg/dL — ABNORMAL HIGH (ref 0.3–1.2)
Total Protein: 7.1 g/dL (ref 6.5–8.1)

## 2022-06-11 DIAGNOSIS — M25561 Pain in right knee: Secondary | ICD-10-CM | POA: Diagnosis not present

## 2022-06-12 LAB — CEA: CEA: 1.4 ng/mL (ref 0.0–4.7)

## 2022-06-16 DIAGNOSIS — H401132 Primary open-angle glaucoma, bilateral, moderate stage: Secondary | ICD-10-CM | POA: Diagnosis not present
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
## 2022-06-17 ENCOUNTER — Inpatient Hospital Stay (HOSPITAL_BASED_OUTPATIENT_CLINIC_OR_DEPARTMENT_OTHER): Payer: Medicare HMO | Admitting: Hematology

## 2022-06-17 VITALS — BP 153/64 | HR 85 | Temp 98.3°F | Resp 18 | Ht 62.0 in | Wt 135.3 lb

## 2022-06-17 DIAGNOSIS — C183 Malignant neoplasm of hepatic flexure: Secondary | ICD-10-CM | POA: Diagnosis not present

## 2022-06-17 DIAGNOSIS — D649 Anemia, unspecified: Secondary | ICD-10-CM | POA: Diagnosis not present

## 2022-06-17 DIAGNOSIS — Z85038 Personal history of other malignant neoplasm of large intestine: Secondary | ICD-10-CM | POA: Diagnosis not present

## 2022-06-17 NOTE — Patient Instructions (Addendum)
[place] [facility] Discharge Instructions   You were seen and examined today by Dr. Delton Coombes.  He reviewed the results of your lab work which are normal/stable. Your cancer count is normal.   We will see you back in 6 months. We will repeat blood work and a CT scan prior to this visit.    Thank you for choosing [place] at [facility] to provide your oncology and hematology care.  To afford each patient quality time with our provider, please arrive at least 15 minutes before your scheduled appointment time.   If you have a lab appointment with the [facility] please come in thru the Main Entrance and check in at the main information desk.  You need to re-schedule your appointment should you arrive 10 or more minutes late.  We strive to give you quality time with our providers, and arriving late affects you and other patients whose appointments are after yours.  Also, if you no show three or more times for appointments you may be dismissed from the clinic at the providers discretion.     Again, thank you for choosing [facility].  Our hope is that these requests will decrease the amount of time that you wait before being seen by our physicians.       _____________________________________________________________  Should you have questions after your visit to [facility], please contact our office at [phone] and follow the prompts.  Our office hours are 8:00 a.m. and 4:30 p.m. Monday - Friday.  Please note that voicemails left after 4:00 p.m. may not be returned until the following business day.  We are closed weekends and major holidays.  You do have access to a nurse 24-7, just call the main number to the clinic [phone] and do not press any options, hold on the line and a nurse will answer the phone.    For prescription refill requests, have your pharmacy contact our office and allow 72 hours.     Due to Covid, you will need to wear a mask upon entering the hospital. If you do not have a mask, a mask will be given to you at the Main Entrance upon arrival. For doctor visits, patients may have 1 support person age 43 or older with them. For treatment visits, patients can not have anyone with them due to social distancing guidelines and our immunocompromised population.

## 2022-06-18 ENCOUNTER — Other Ambulatory Visit: Payer: Self-pay

## 2022-06-20 ENCOUNTER — Other Ambulatory Visit: Payer: Self-pay

## 2022-06-23 DIAGNOSIS — M1711 Unilateral primary osteoarthritis, right knee: Secondary | ICD-10-CM | POA: Diagnosis not present

## 2022-06-23 DIAGNOSIS — M542 Cervicalgia: Secondary | ICD-10-CM | POA: Diagnosis not present

## 2022-06-23 DIAGNOSIS — M545 Low back pain, unspecified: Secondary | ICD-10-CM | POA: Diagnosis not present

## 2022-06-30 DIAGNOSIS — M542 Cervicalgia: Secondary | ICD-10-CM | POA: Diagnosis not present

## 2022-06-30 DIAGNOSIS — M1711 Unilateral primary osteoarthritis, right knee: Secondary | ICD-10-CM | POA: Diagnosis not present

## 2022-06-30 DIAGNOSIS — M545 Low back pain, unspecified: Secondary | ICD-10-CM | POA: Diagnosis not present

## 2022-07-01 DIAGNOSIS — M1711 Unilateral primary osteoarthritis, right knee: Secondary | ICD-10-CM | POA: Diagnosis not present

## 2022-07-01 DIAGNOSIS — M545 Low back pain, unspecified: Secondary | ICD-10-CM | POA: Diagnosis not present

## 2022-07-01 DIAGNOSIS — M542 Cervicalgia: Secondary | ICD-10-CM | POA: Diagnosis not present

## 2022-07-06 DIAGNOSIS — M545 Low back pain, unspecified: Secondary | ICD-10-CM | POA: Diagnosis not present

## 2022-07-06 DIAGNOSIS — M542 Cervicalgia: Secondary | ICD-10-CM | POA: Diagnosis not present

## 2022-07-06 DIAGNOSIS — M1711 Unilateral primary osteoarthritis, right knee: Secondary | ICD-10-CM | POA: Diagnosis not present

## 2022-07-08 DIAGNOSIS — M542 Cervicalgia: Secondary | ICD-10-CM | POA: Diagnosis not present

## 2022-07-08 DIAGNOSIS — M545 Low back pain, unspecified: Secondary | ICD-10-CM | POA: Diagnosis not present

## 2022-07-08 DIAGNOSIS — M1711 Unilateral primary osteoarthritis, right knee: Secondary | ICD-10-CM | POA: Diagnosis not present

## 2022-07-09 DIAGNOSIS — M25561 Pain in right knee: Secondary | ICD-10-CM | POA: Diagnosis not present

## 2022-07-13 DIAGNOSIS — H2513 Age-related nuclear cataract, bilateral: Secondary | ICD-10-CM | POA: Diagnosis not present

## 2022-07-13 DIAGNOSIS — H401130 Primary open-angle glaucoma, bilateral, stage unspecified: Secondary | ICD-10-CM | POA: Diagnosis not present

## 2022-07-14 DIAGNOSIS — M542 Cervicalgia: Secondary | ICD-10-CM | POA: Diagnosis not present

## 2022-07-14 DIAGNOSIS — M545 Low back pain, unspecified: Secondary | ICD-10-CM | POA: Diagnosis not present

## 2022-07-14 DIAGNOSIS — M1711 Unilateral primary osteoarthritis, right knee: Secondary | ICD-10-CM | POA: Diagnosis not present

## 2022-07-16 DIAGNOSIS — M542 Cervicalgia: Secondary | ICD-10-CM | POA: Diagnosis not present

## 2022-07-16 DIAGNOSIS — M1711 Unilateral primary osteoarthritis, right knee: Secondary | ICD-10-CM | POA: Diagnosis not present

## 2022-07-16 DIAGNOSIS — M545 Low back pain, unspecified: Secondary | ICD-10-CM | POA: Diagnosis not present

## 2022-07-20 DIAGNOSIS — M545 Low back pain, unspecified: Secondary | ICD-10-CM | POA: Diagnosis not present

## 2022-07-20 DIAGNOSIS — M1711 Unilateral primary osteoarthritis, right knee: Secondary | ICD-10-CM | POA: Diagnosis not present

## 2022-07-20 DIAGNOSIS — M542 Cervicalgia: Secondary | ICD-10-CM | POA: Diagnosis not present

## 2022-07-22 DIAGNOSIS — M542 Cervicalgia: Secondary | ICD-10-CM | POA: Diagnosis not present

## 2022-07-22 DIAGNOSIS — M1711 Unilateral primary osteoarthritis, right knee: Secondary | ICD-10-CM | POA: Diagnosis not present

## 2022-07-22 DIAGNOSIS — M545 Low back pain, unspecified: Secondary | ICD-10-CM | POA: Diagnosis not present

## 2022-07-27 DIAGNOSIS — M545 Low back pain, unspecified: Secondary | ICD-10-CM | POA: Diagnosis not present

## 2022-07-27 DIAGNOSIS — M1711 Unilateral primary osteoarthritis, right knee: Secondary | ICD-10-CM | POA: Diagnosis not present

## 2022-07-27 DIAGNOSIS — M542 Cervicalgia: Secondary | ICD-10-CM | POA: Diagnosis not present

## 2022-07-29 DIAGNOSIS — M545 Low back pain, unspecified: Secondary | ICD-10-CM | POA: Diagnosis not present

## 2022-07-29 DIAGNOSIS — M542 Cervicalgia: Secondary | ICD-10-CM | POA: Diagnosis not present

## 2022-07-29 DIAGNOSIS — M1711 Unilateral primary osteoarthritis, right knee: Secondary | ICD-10-CM | POA: Diagnosis not present

## 2022-08-03 DIAGNOSIS — I700 Atherosclerosis of aorta: Secondary | ICD-10-CM | POA: Diagnosis not present

## 2022-08-03 DIAGNOSIS — M1711 Unilateral primary osteoarthritis, right knee: Secondary | ICD-10-CM | POA: Diagnosis not present

## 2022-08-03 DIAGNOSIS — M545 Low back pain, unspecified: Secondary | ICD-10-CM | POA: Diagnosis not present

## 2022-08-03 DIAGNOSIS — E785 Hyperlipidemia, unspecified: Secondary | ICD-10-CM | POA: Diagnosis not present

## 2022-08-03 DIAGNOSIS — M542 Cervicalgia: Secondary | ICD-10-CM | POA: Diagnosis not present

## 2022-08-03 DIAGNOSIS — R079 Chest pain, unspecified: Secondary | ICD-10-CM | POA: Diagnosis not present

## 2022-08-03 DIAGNOSIS — F419 Anxiety disorder, unspecified: Secondary | ICD-10-CM | POA: Diagnosis not present

## 2022-08-03 DIAGNOSIS — Z299 Encounter for prophylactic measures, unspecified: Secondary | ICD-10-CM | POA: Diagnosis not present

## 2022-08-05 DIAGNOSIS — M542 Cervicalgia: Secondary | ICD-10-CM | POA: Diagnosis not present

## 2022-08-05 DIAGNOSIS — M1711 Unilateral primary osteoarthritis, right knee: Secondary | ICD-10-CM | POA: Diagnosis not present

## 2022-08-05 DIAGNOSIS — M545 Low back pain, unspecified: Secondary | ICD-10-CM | POA: Diagnosis not present

## 2022-08-10 DIAGNOSIS — M545 Low back pain, unspecified: Secondary | ICD-10-CM | POA: Diagnosis not present

## 2022-08-10 DIAGNOSIS — M542 Cervicalgia: Secondary | ICD-10-CM | POA: Diagnosis not present

## 2022-08-10 DIAGNOSIS — M1711 Unilateral primary osteoarthritis, right knee: Secondary | ICD-10-CM | POA: Diagnosis not present

## 2022-08-12 DIAGNOSIS — M545 Low back pain, unspecified: Secondary | ICD-10-CM | POA: Diagnosis not present

## 2022-08-12 DIAGNOSIS — M1711 Unilateral primary osteoarthritis, right knee: Secondary | ICD-10-CM | POA: Diagnosis not present

## 2022-08-12 DIAGNOSIS — M542 Cervicalgia: Secondary | ICD-10-CM | POA: Diagnosis not present

## 2022-08-13 DIAGNOSIS — M25561 Pain in right knee: Secondary | ICD-10-CM | POA: Diagnosis not present

## 2022-08-17 DIAGNOSIS — E039 Hypothyroidism, unspecified: Secondary | ICD-10-CM | POA: Diagnosis not present

## 2022-08-17 DIAGNOSIS — N183 Chronic kidney disease, stage 3 unspecified: Secondary | ICD-10-CM | POA: Diagnosis not present

## 2022-08-17 DIAGNOSIS — I700 Atherosclerosis of aorta: Secondary | ICD-10-CM | POA: Diagnosis not present

## 2022-08-17 DIAGNOSIS — M1711 Unilateral primary osteoarthritis, right knee: Secondary | ICD-10-CM | POA: Diagnosis not present

## 2022-08-17 DIAGNOSIS — M545 Low back pain, unspecified: Secondary | ICD-10-CM | POA: Diagnosis not present

## 2022-08-17 DIAGNOSIS — M542 Cervicalgia: Secondary | ICD-10-CM | POA: Diagnosis not present

## 2022-08-17 DIAGNOSIS — Z299 Encounter for prophylactic measures, unspecified: Secondary | ICD-10-CM | POA: Diagnosis not present

## 2022-08-17 DIAGNOSIS — F419 Anxiety disorder, unspecified: Secondary | ICD-10-CM | POA: Diagnosis not present

## 2022-08-27 DIAGNOSIS — M1711 Unilateral primary osteoarthritis, right knee: Secondary | ICD-10-CM | POA: Diagnosis not present

## 2022-09-03 DIAGNOSIS — M1711 Unilateral primary osteoarthritis, right knee: Secondary | ICD-10-CM | POA: Diagnosis not present

## 2022-09-15 ENCOUNTER — Inpatient Hospital Stay: Payer: Medicare HMO

## 2022-09-15 ENCOUNTER — Inpatient Hospital Stay: Payer: Medicare HMO | Attending: Hematology

## 2022-09-15 VITALS — BP 138/65 | HR 60 | Temp 98.0°F | Resp 18

## 2022-09-15 DIAGNOSIS — Z452 Encounter for adjustment and management of vascular access device: Secondary | ICD-10-CM | POA: Insufficient documentation

## 2022-09-15 DIAGNOSIS — Z95828 Presence of other vascular implants and grafts: Secondary | ICD-10-CM

## 2022-09-15 DIAGNOSIS — Z85038 Personal history of other malignant neoplasm of large intestine: Secondary | ICD-10-CM | POA: Insufficient documentation

## 2022-09-15 MED ORDER — HEPARIN SOD (PORK) LOCK FLUSH 100 UNIT/ML IV SOLN
500.0000 [IU] | Freq: Once | INTRAVENOUS | Status: AC
  Administered 2022-09-15: 500 [IU] via INTRAVENOUS

## 2022-09-15 MED ORDER — SODIUM CHLORIDE 0.9% FLUSH
10.0000 mL | Freq: Once | INTRAVENOUS | Status: AC
  Administered 2022-09-15: 10 mL via INTRAVENOUS

## 2022-09-15 NOTE — Patient Instructions (Signed)
[facility]  Discharge Instructions: Thank you for choosing [facility] to provide your oncology and hematology care.  If you have a lab appointment with the Cancer Center - please note that after April 8th, 2024, all labs will be drawn in the cancer center.  You do not have to check in or register with the main entrance as you have in the past but will complete your check-in in the cancer center.  Wear comfortable clothing and clothing appropriate for easy access to any Portacath or PICC line.   We strive to give you quality time with your provider. You may need to reschedule your appointment if you arrive late (15 or more minutes).  Arriving late affects you and other patients whose appointments are after yours.  Also, if you miss three or more appointments without notifying the office, you may be dismissed from the clinic at the provider's discretion.      For prescription refill requests, have your pharmacy contact our office and allow 72 hours for refills to be completed.    Today you received the following port flush, return as scheduled.   To help prevent nausea and vomiting after your treatment, we encourage you to take your nausea medication as directed.  BELOW ARE SYMPTOMS THAT SHOULD BE REPORTED IMMEDIATELY: *FEVER GREATER THAN 100.4 F (38 C) OR HIGHER *CHILLS OR SWEATING *NAUSEA AND VOMITING THAT IS NOT CONTROLLED WITH YOUR NAUSEA MEDICATION *UNUSUAL SHORTNESS OF BREATH *UNUSUAL BRUISING OR BLEEDING *URINARY PROBLEMS (pain or burning when urinating, or frequent urination) *BOWEL PROBLEMS (unusual diarrhea, constipation, pain near the anus) TENDERNESS IN MOUTH AND THROAT WITH OR WITHOUT PRESENCE OF ULCERS (sore throat, sores in mouth, or a toothache) UNUSUAL RASH, SWELLING OR PAIN  UNUSUAL VAGINAL DISCHARGE OR ITCHING   Items with * indicate a potential emergency and should be followed up as soon as possible or go to the Emergency Department  if any problems should occur.  Please show the CHEMOTHERAPY ALERT CARD or IMMUNOTHERAPY ALERT CARD at check-in to the Emergency Department and triage nurse.  Should you have questions after your visit or need to cancel or reschedule your appointment, please contact [facility] [phone]  and follow the prompts.  Office hours are 8:00 a.m. to 4:30 p.m. Monday - Friday. Please note that voicemails left after 4:00 p.m. may not be returned until the following business day.  We are closed weekends and major holidays. You have access to a nurse at all times for urgent questions. Please call the main number to the clinic [phone] and follow the prompts.  For any non-urgent questions, you may also contact your provider using MyChart. We now offer e-Visits for anyone 18 and older to request care online for non-urgent symptoms. For details visit [email or web address].   Also download the MyChart app! Go to the app store, search "MyChart", open the app, select [facility], and log in with your MyChart username and password.   

## 2022-09-15 NOTE — Progress Notes (Signed)
Port flushed with no blood return noted. No bruising or swelling at site. Bandaid applied and patient discharged in satisfactory condition. VVS stable with no signs or symptoms of distressed noted.  

## 2022-10-12 DIAGNOSIS — H2513 Age-related nuclear cataract, bilateral: Secondary | ICD-10-CM | POA: Diagnosis not present

## 2022-10-12 DIAGNOSIS — H401130 Primary open-angle glaucoma, bilateral, stage unspecified: Secondary | ICD-10-CM | POA: Diagnosis not present

## 2022-12-06 ENCOUNTER — Ambulatory Visit (HOSPITAL_COMMUNITY): Payer: Medicare HMO

## 2022-12-06 ENCOUNTER — Inpatient Hospital Stay: Payer: Medicare HMO

## 2022-12-07 ENCOUNTER — Ambulatory Visit (HOSPITAL_COMMUNITY): Payer: Medicare HMO

## 2022-12-07 ENCOUNTER — Other Ambulatory Visit: Payer: Self-pay

## 2022-12-07 ENCOUNTER — Inpatient Hospital Stay: Payer: Medicare HMO

## 2022-12-16 ENCOUNTER — Inpatient Hospital Stay: Payer: Medicare HMO

## 2022-12-16 ENCOUNTER — Inpatient Hospital Stay: Payer: Medicare HMO | Admitting: Hematology

## 2022-12-20 ENCOUNTER — Other Ambulatory Visit: Payer: Self-pay

## 2022-12-28 ENCOUNTER — Ambulatory Visit (HOSPITAL_COMMUNITY)
Admission: RE | Admit: 2022-12-28 | Discharge: 2022-12-28 | Disposition: A | Discharge status: Home / Self Care | Payer: Medicare HMO | Source: Ambulatory Visit | Attending: Hematology | Admitting: Hematology

## 2022-12-28 ENCOUNTER — Inpatient Hospital Stay: Payer: Medicare HMO | Attending: Hematology

## 2022-12-28 DIAGNOSIS — C189 Malignant neoplasm of colon, unspecified: Secondary | ICD-10-CM | POA: Diagnosis not present

## 2022-12-28 DIAGNOSIS — Z85038 Personal history of other malignant neoplasm of large intestine: Secondary | ICD-10-CM | POA: Insufficient documentation

## 2022-12-28 DIAGNOSIS — C183 Malignant neoplasm of hepatic flexure: Secondary | ICD-10-CM

## 2022-12-28 DIAGNOSIS — Z452 Encounter for adjustment and management of vascular access device: Secondary | ICD-10-CM | POA: Insufficient documentation

## 2022-12-28 DIAGNOSIS — Z08 Encounter for follow-up examination after completed treatment for malignant neoplasm: Secondary | ICD-10-CM | POA: Insufficient documentation

## 2022-12-28 DIAGNOSIS — D649 Anemia, unspecified: Secondary | ICD-10-CM | POA: Diagnosis not present

## 2022-12-28 LAB — IRON AND TIBC
Iron: 109 ug/dL (ref 28–170)
Saturation Ratios: 32 % — ABNORMAL HIGH (ref 10.4–31.8)
TIBC: 338 ug/dL (ref 250–450)
UIBC: 229 ug/dL

## 2022-12-28 LAB — COMPREHENSIVE METABOLIC PANEL
ALT: 15 U/L (ref 0–44)
AST: 21 U/L (ref 15–41)
Albumin: 4.3 g/dL (ref 3.5–5.0)
Alkaline Phosphatase: 38 U/L (ref 38–126)
Anion gap: 11 (ref 5–15)
BUN: 14 mg/dL (ref 8–23)
CO2: 24 mmol/L (ref 22–32)
Calcium: 9.3 mg/dL (ref 8.9–10.3)
Chloride: 101 mmol/L (ref 98–111)
Creatinine, Ser: 0.97 mg/dL (ref 0.44–1.00)
GFR, Estimated: >60 mL/min (ref >60)
Glucose, Bld: 99 mg/dL (ref 70–99)
Potassium: 4.2 mmol/L (ref 3.5–5.1)
Sodium: 136 mmol/L (ref 135–145)
Total Bilirubin: 1.7 mg/dL — ABNORMAL HIGH (ref 0.3–1.2)
Total Protein: 7.1 g/dL (ref 6.5–8.1)

## 2022-12-28 LAB — CBC WITH DIFFERENTIAL/PLATELET
Abs Immature Granulocytes: 0.01 10*3/uL (ref 0.00–0.07)
Basophils Absolute: 0 10*3/uL (ref 0.0–0.1)
Basophils Relative: 1 %
Eosinophils Absolute: 0.2 10*3/uL (ref 0.0–0.5)
Eosinophils Relative: 4 %
HCT: 35.3 % — ABNORMAL LOW (ref 36.0–46.0)
Hemoglobin: 12.3 g/dL (ref 12.0–15.0)
Immature Granulocytes: 0 %
Lymphocytes Relative: 37 %
Lymphs Abs: 1.8 10*3/uL (ref 0.7–4.0)
MCH: 31.6 pg (ref 26.0–34.0)
MCHC: 34.8 g/dL (ref 30.0–36.0)
MCV: 90.7 fL (ref 80.0–100.0)
Monocytes Absolute: 0.4 10*3/uL (ref 0.1–1.0)
Monocytes Relative: 9 %
Neutro Abs: 2.4 10*3/uL (ref 1.7–7.7)
Neutrophils Relative %: 49 %
Platelets: 154 10*3/uL (ref 150–400)
RBC: 3.89 MIL/uL (ref 3.87–5.11)
RDW: 11.7 % (ref 11.5–15.5)
WBC: 4.8 10*3/uL (ref 4.0–10.5)
nRBC: 0 % (ref 0.0–0.2)

## 2022-12-28 LAB — FERRITIN: Ferritin: 150 ng/mL (ref 11–307)

## 2022-12-28 MED ORDER — IOHEXOL 300 MG/ML  SOLN
100.0000 mL | Freq: Once | INTRAMUSCULAR | Status: AC | PRN
  Administered 2022-12-28: 100 mL via INTRAVENOUS

## 2022-12-29 LAB — CEA: CEA: 1.6 ng/mL (ref 0.0–4.7)

## 2023-01-04 ENCOUNTER — Inpatient Hospital Stay (HOSPITAL_BASED_OUTPATIENT_CLINIC_OR_DEPARTMENT_OTHER): Payer: Medicare HMO | Admitting: Hematology

## 2023-01-04 ENCOUNTER — Inpatient Hospital Stay: Payer: Medicare HMO

## 2023-01-04 VITALS — BP 154/79 | HR 66 | Temp 98.0°F | Resp 17 | Ht 62.0 in | Wt 131.4 lb

## 2023-01-04 DIAGNOSIS — R911 Solitary pulmonary nodule: Secondary | ICD-10-CM

## 2023-01-04 DIAGNOSIS — Z08 Encounter for follow-up examination after completed treatment for malignant neoplasm: Secondary | ICD-10-CM | POA: Diagnosis not present

## 2023-01-04 DIAGNOSIS — C189 Malignant neoplasm of colon, unspecified: Secondary | ICD-10-CM

## 2023-01-04 DIAGNOSIS — Z452 Encounter for adjustment and management of vascular access device: Secondary | ICD-10-CM | POA: Diagnosis not present

## 2023-01-04 DIAGNOSIS — Z85038 Personal history of other malignant neoplasm of large intestine: Secondary | ICD-10-CM | POA: Diagnosis not present

## 2023-01-04 DIAGNOSIS — C183 Malignant neoplasm of hepatic flexure: Secondary | ICD-10-CM

## 2023-01-04 DIAGNOSIS — D649 Anemia, unspecified: Secondary | ICD-10-CM | POA: Diagnosis not present

## 2023-01-04 MED ORDER — HEPARIN SOD (PORK) LOCK FLUSH 100 UNIT/ML IV SOLN
500.0000 [IU] | Freq: Once | INTRAVENOUS | Status: AC
  Administered 2023-01-04: 500 [IU] via INTRAVENOUS

## 2023-01-04 MED ORDER — SODIUM CHLORIDE 0.9% FLUSH
10.0000 mL | Freq: Once | INTRAVENOUS | Status: AC
  Administered 2023-01-04: 10 mL via INTRAVENOUS
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
## 2023-01-04 NOTE — Patient Instructions (Signed)
[facility] at [facility] Discharge Instructions   You were seen and examined today by Dr. Ellin Saba.  He reviewed the results of your lab work which are normal/stable.   He reviewed the results of your CT scan which is normal. There is no evidence of cancer returning.   We will see you back in 6 months. We will repeat lab work and a CT scan of your chest prior to this visit.   Return as scheduled.    Thank you for choosing [facility] at [facility] to provide your oncology and hematology care.  To afford each patient quality time with our provider, please arrive at least 15 minutes before your scheduled appointment time.   If you have a lab appointment with the Cancer Center please come in thru the Main Entrance and check in at the main information desk.  You need to re-schedule your appointment should you arrive 10 or more minutes late.  We strive to give you quality time with our providers, and arriving late affects you and other patients whose appointments are after yours.  Also, if you no show three or more times for appointments you may be dismissed from the clinic at the providers discretion.     Again, thank you for choosing [facility].  Our hope is that these requests will decrease the amount of time that you wait before being seen by our physicians.       _____________________________________________________________  Should you have questions after your visit to [facility], please contact our office at [phone] and follow the prompts.  Our office hours are 8:00 a.m. and 4:30 p.m. Monday - Friday.  Please note that voicemails left after 4:00 p.m. may not be returned until the following business day.  We are closed weekends and major holidays.  You do have access to a nurse 24-7, just call the main number to the clinic [phone] and do not press any options, hold on the line and a nurse will answer the  phone.    For prescription refill requests, have your pharmacy contact our office and allow 72 hours.    Due to Covid, you will need to wear a mask upon entering the hospital. If you do not have a mask, a mask will be given to you at the Main Entrance upon arrival. For doctor visits, patients may have 1 support person age 77 or older with them. For treatment visits, patients can not have anyone with them due to social distancing guidelines and our immunocompromised population.

## 2023-01-04 NOTE — Progress Notes (Signed)
Patients port flushed without difficulty.  No blood return noted with no bruising or swelling noted at site.  Band aid applied.  VSS with discharge and left in satisfactory condition with no s/s of distress noted.   

## 2023-01-04 NOTE — Patient Instructions (Signed)

## 2023-01-05 ENCOUNTER — Other Ambulatory Visit: Payer: Self-pay

## 2023-01-11 DIAGNOSIS — H401130 Primary open-angle glaucoma, bilateral, stage unspecified: Secondary | ICD-10-CM | POA: Diagnosis not present

## 2023-01-11 DIAGNOSIS — H2513 Age-related nuclear cataract, bilateral: Secondary | ICD-10-CM | POA: Diagnosis not present

## 2023-01-20 ENCOUNTER — Other Ambulatory Visit: Payer: Self-pay

## 2023-03-23 DIAGNOSIS — Z0000 Encounter for general adult medical examination without abnormal findings: Secondary | ICD-10-CM | POA: Diagnosis not present

## 2023-03-23 DIAGNOSIS — Z79899 Other long term (current) drug therapy: Secondary | ICD-10-CM | POA: Diagnosis not present

## 2023-03-23 DIAGNOSIS — E039 Hypothyroidism, unspecified: Secondary | ICD-10-CM | POA: Diagnosis not present

## 2023-03-23 DIAGNOSIS — Z1331 Encounter for screening for depression: Secondary | ICD-10-CM | POA: Diagnosis not present

## 2023-03-23 DIAGNOSIS — R5383 Other fatigue: Secondary | ICD-10-CM | POA: Diagnosis not present

## 2023-03-23 DIAGNOSIS — Z1339 Encounter for screening examination for other mental health and behavioral disorders: Secondary | ICD-10-CM | POA: Diagnosis not present

## 2023-03-23 DIAGNOSIS — Z7189 Other specified counseling: Secondary | ICD-10-CM | POA: Diagnosis not present

## 2023-03-23 DIAGNOSIS — Z299 Encounter for prophylactic measures, unspecified: Secondary | ICD-10-CM | POA: Diagnosis not present

## 2023-03-23 DIAGNOSIS — E7800 Pure hypercholesterolemia, unspecified: Secondary | ICD-10-CM | POA: Diagnosis not present

## 2023-04-06 ENCOUNTER — Ambulatory Visit
Admission: EM | Admit: 2023-04-06 | Discharge: 2023-04-06 | Disposition: A | Discharge status: Home / Self Care | Payer: Medicare HMO | Attending: Nurse Practitioner | Admitting: Nurse Practitioner

## 2023-04-06 ENCOUNTER — Other Ambulatory Visit: Payer: Self-pay

## 2023-04-06 ENCOUNTER — Encounter: Payer: Self-pay | Admitting: Emergency Medicine

## 2023-04-06 DIAGNOSIS — H2513 Age-related nuclear cataract, bilateral: Secondary | ICD-10-CM | POA: Diagnosis not present

## 2023-04-06 DIAGNOSIS — H401130 Primary open-angle glaucoma, bilateral, stage unspecified: Secondary | ICD-10-CM | POA: Diagnosis not present

## 2023-04-06 DIAGNOSIS — R0982 Postnasal drip: Secondary | ICD-10-CM | POA: Diagnosis not present

## 2023-04-06 DIAGNOSIS — J029 Acute pharyngitis, unspecified: Secondary | ICD-10-CM | POA: Diagnosis not present

## 2023-04-06 LAB — POCT RAPID STREP A (OFFICE): Rapid Strep A Screen: NEGATIVE

## 2023-04-06 MED ORDER — LIDOCAINE VISCOUS HCL 2 % MT SOLN: 10.0000 mL | Freq: Three times a day (TID) | OROMUCOSAL | 0 refills | Status: DC | PRN

## 2023-04-06 NOTE — Discharge Instructions (Addendum)
The rapid strep test was negative.  A throat culture is pending.  You will be contacted if the pending test result is abnormal.  You also have access to the results via MyChart. Increase fluids and allow for plenty of rest. Continue over-the-counter Tylenol as needed for pain, fever, or general discomfort. Warm salt water gargles 3-4 times daily or as needed for throat pain or discomfort. Recommend a soft diet to include soup, broth, yogurt, pudding, Jell-O, or applesauce. Recommend drinking warm teas with honey or lemon, or eating a teaspoon of honey as needed for throat pain or discomfort.  You may also try over-the-counter Chloraseptic throat spray and throat lozenges for throat pain. If symptoms have not improved over the next 7 to 10 days, or appear to be worsening, develop a worsening cough with shortness of breath or difficulty breathing, you may follow-up in this clinic or with his pediatrician for further evaluation.
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
## 2023-04-06 NOTE — ED Triage Notes (Signed)
Pt reports cough for several days and reports intense throat pain since last night. Denies any known fevers.

## 2023-04-08 ENCOUNTER — Inpatient Hospital Stay: Payer: Medicare HMO

## 2023-04-08 DIAGNOSIS — R5383 Other fatigue: Secondary | ICD-10-CM | POA: Diagnosis not present

## 2023-04-08 DIAGNOSIS — J029 Acute pharyngitis, unspecified: Secondary | ICD-10-CM | POA: Diagnosis not present

## 2023-04-08 DIAGNOSIS — I700 Atherosclerosis of aorta: Secondary | ICD-10-CM | POA: Diagnosis not present

## 2023-04-09 LAB — CULTURE, GROUP A STREP (THRC)

## 2023-04-18 ENCOUNTER — Inpatient Hospital Stay: Payer: Medicare HMO | Attending: Hematology

## 2023-04-18 DIAGNOSIS — Z452 Encounter for adjustment and management of vascular access device: Secondary | ICD-10-CM | POA: Diagnosis not present

## 2023-04-18 DIAGNOSIS — Z85038 Personal history of other malignant neoplasm of large intestine: Secondary | ICD-10-CM | POA: Diagnosis not present

## 2023-04-18 MED ORDER — HEPARIN SOD (PORK) LOCK FLUSH 100 UNIT/ML IV SOLN
500.0000 [IU] | Freq: Once | INTRAVENOUS | Status: AC
  Administered 2023-04-18: 500 [IU] via INTRAVENOUS

## 2023-04-18 MED ORDER — SODIUM CHLORIDE 0.9% FLUSH
10.0000 mL | INTRAVENOUS | Status: AC
  Administered 2023-04-18: 10 mL

## 2023-04-18 NOTE — Progress Notes (Signed)
Dorthea Cove presented for Portacath access and flush.  Portacath located left chest wall accessed with  H 20 needle.  No blood return and    Portacath flushed with 20ml NS and 500U/56ml Heparin and needle removed intact.  Procedure tolerated well. Vital signs stable. No complaints at this time. Discharged from clinic ambulatory in stable condition. Alert and oriented x 3. F/U with [facility] as scheduled.

## 2023-04-18 NOTE — Patient Instructions (Signed)
[facility] [place] - A DEPT OF [facility]  Discharge Instructions: Thank you for choosing [facility] to provide your oncology and hematology care.  If you have a lab appointment with the Cancer Center - please note that after April 8th, 2024, all labs will be drawn in the cancer center.  You do not have to check in or register with the main entrance as you have in the past but will complete your check-in in the cancer center.  Wear comfortable clothing and clothing appropriate for easy access to any Portacath or PICC line.   We strive to give you quality time with your provider. You may need to reschedule your appointment if you arrive late (15 or more minutes).  Arriving late affects you and other patients whose appointments are after yours.  Also, if you miss three or more appointments without notifying the office, you may be dismissed from the clinic at the provider's discretion.      For prescription refill requests, have your pharmacy contact our office and allow 72 hours for refills to be completed.    Today you received the following chemotherapy and/or immunotherapy agents port flush.       To help prevent nausea and vomiting after your treatment, we encourage you to take your nausea medication as directed.  BELOW ARE SYMPTOMS THAT SHOULD BE REPORTED IMMEDIATELY: *FEVER GREATER THAN 100.4 F (38 C) OR HIGHER *CHILLS OR SWEATING *NAUSEA AND VOMITING THAT IS NOT CONTROLLED WITH YOUR NAUSEA MEDICATION *UNUSUAL SHORTNESS OF BREATH *UNUSUAL BRUISING OR BLEEDING *URINARY PROBLEMS (pain or burning when urinating, or frequent urination) *BOWEL PROBLEMS (unusual diarrhea, constipation, pain near the anus) TENDERNESS IN MOUTH AND THROAT WITH OR WITHOUT PRESENCE OF ULCERS (sore throat, sores in mouth, or a toothache) UNUSUAL RASH, SWELLING OR PAIN  UNUSUAL VAGINAL DISCHARGE OR ITCHING   Items with * indicate a potential emergency and should be  followed up as soon as possible or go to the Emergency Department if any problems should occur.  Please show the CHEMOTHERAPY ALERT CARD or IMMUNOTHERAPY ALERT CARD at check-in to the Emergency Department and triage nurse.  Should you have questions after your visit or need to cancel or reschedule your appointment, please contact [facility] [place] - A DEPT OF [facility] [phone]  and follow the prompts.  Office hours are 8:00 a.m. to 4:30 p.m. Monday - Friday. Please note that voicemails left after 4:00 p.m. may not be returned until the following business day.  We are closed weekends and major holidays. You have access to a nurse at all times for urgent questions. Please call the main number to the clinic [phone] and follow the prompts.  For any non-urgent questions, you may also contact your provider using MyChart. We now offer e-Visits for anyone 74 and older to request care online for non-urgent symptoms. For details visit [email or web address].   Also download the MyChart app! Go to the app store, search "MyChart", open the app, select [place], and log in with your MyChart username and password.

## 2023-04-22 DIAGNOSIS — H401130 Primary open-angle glaucoma, bilateral, stage unspecified: Secondary | ICD-10-CM | POA: Diagnosis not present

## 2023-04-22 DIAGNOSIS — H2513 Age-related nuclear cataract, bilateral: Secondary | ICD-10-CM | POA: Diagnosis not present

## 2023-06-08 DIAGNOSIS — M549 Dorsalgia, unspecified: Secondary | ICD-10-CM | POA: Diagnosis not present

## 2023-06-08 DIAGNOSIS — Z299 Encounter for prophylactic measures, unspecified: Secondary | ICD-10-CM | POA: Diagnosis not present

## 2023-06-08 DIAGNOSIS — R1032 Left lower quadrant pain: Secondary | ICD-10-CM | POA: Diagnosis not present

## 2023-06-08 DIAGNOSIS — I700 Atherosclerosis of aorta: Secondary | ICD-10-CM | POA: Diagnosis not present

## 2023-06-08 DIAGNOSIS — N183 Chronic kidney disease, stage 3 unspecified: Secondary | ICD-10-CM | POA: Diagnosis not present

## 2023-06-13 DIAGNOSIS — K449 Diaphragmatic hernia without obstruction or gangrene: Secondary | ICD-10-CM | POA: Diagnosis not present

## 2023-06-13 DIAGNOSIS — R160 Hepatomegaly, not elsewhere classified: Secondary | ICD-10-CM | POA: Diagnosis not present

## 2023-06-13 DIAGNOSIS — N200 Calculus of kidney: Secondary | ICD-10-CM | POA: Diagnosis not present

## 2023-06-13 DIAGNOSIS — C189 Malignant neoplasm of colon, unspecified: Secondary | ICD-10-CM | POA: Diagnosis not present

## 2023-06-13 DIAGNOSIS — R1032 Left lower quadrant pain: Secondary | ICD-10-CM | POA: Diagnosis not present

## 2023-06-19 ENCOUNTER — Encounter: Payer: Self-pay | Admitting: Hematology

## 2023-06-19 ENCOUNTER — Encounter (HOSPITAL_COMMUNITY): Payer: Self-pay | Admitting: Hematology

## 2023-06-27 ENCOUNTER — Encounter (HOSPITAL_COMMUNITY): Payer: Self-pay | Admitting: Hematology

## 2023-06-27 ENCOUNTER — Encounter: Payer: Self-pay | Admitting: Hematology

## 2023-06-29 DIAGNOSIS — I700 Atherosclerosis of aorta: Secondary | ICD-10-CM | POA: Diagnosis not present

## 2023-06-29 DIAGNOSIS — Z299 Encounter for prophylactic measures, unspecified: Secondary | ICD-10-CM | POA: Diagnosis not present

## 2023-06-29 DIAGNOSIS — R918 Other nonspecific abnormal finding of lung field: Secondary | ICD-10-CM | POA: Diagnosis not present

## 2023-06-29 DIAGNOSIS — N183 Chronic kidney disease, stage 3 unspecified: Secondary | ICD-10-CM | POA: Diagnosis not present

## 2023-06-29 DIAGNOSIS — R160 Hepatomegaly, not elsewhere classified: Secondary | ICD-10-CM | POA: Diagnosis not present

## 2023-07-01 ENCOUNTER — Other Ambulatory Visit: Payer: Self-pay

## 2023-07-04 ENCOUNTER — Inpatient Hospital Stay: Payer: Medicare HMO | Attending: Hematology

## 2023-07-04 ENCOUNTER — Ambulatory Visit: Payer: Medicare HMO

## 2023-07-07 ENCOUNTER — Encounter (HOSPITAL_COMMUNITY)
Admission: RE | Admit: 2023-07-07 | Discharge: 2023-07-07 | Disposition: A | Discharge status: Home / Self Care | Source: Ambulatory Visit | Attending: Hematology | Admitting: Hematology

## 2023-07-07 ENCOUNTER — Other Ambulatory Visit

## 2023-07-07 ENCOUNTER — Inpatient Hospital Stay: Attending: Hematology

## 2023-07-07 DIAGNOSIS — C183 Malignant neoplasm of hepatic flexure: Secondary | ICD-10-CM

## 2023-07-07 DIAGNOSIS — Z85038 Personal history of other malignant neoplasm of large intestine: Secondary | ICD-10-CM | POA: Diagnosis not present

## 2023-07-07 DIAGNOSIS — R918 Other nonspecific abnormal finding of lung field: Secondary | ICD-10-CM | POA: Diagnosis not present

## 2023-07-07 DIAGNOSIS — K769 Liver disease, unspecified: Secondary | ICD-10-CM | POA: Insufficient documentation

## 2023-07-07 LAB — CBC WITH DIFFERENTIAL/PLATELET
Abs Immature Granulocytes: 0.01 10*3/uL (ref 0.00–0.07)
Basophils Absolute: 0 10*3/uL (ref 0.0–0.1)
Basophils Relative: 1 %
Eosinophils Absolute: 0.3 10*3/uL (ref 0.0–0.5)
Eosinophils Relative: 5 %
HCT: 36 % (ref 36.0–46.0)
Hemoglobin: 12.7 g/dL (ref 12.0–15.0)
Immature Granulocytes: 0 %
Lymphocytes Relative: 26 %
Lymphs Abs: 1.3 10*3/uL (ref 0.7–4.0)
MCH: 32 pg (ref 26.0–34.0)
MCHC: 35.3 g/dL (ref 30.0–36.0)
MCV: 90.7 fL (ref 80.0–100.0)
Monocytes Absolute: 0.5 10*3/uL (ref 0.1–1.0)
Monocytes Relative: 10 %
Neutro Abs: 2.9 10*3/uL (ref 1.7–7.7)
Neutrophils Relative %: 58 %
Platelets: 166 10*3/uL (ref 150–400)
RBC: 3.97 MIL/uL (ref 3.87–5.11)
RDW: 11.8 % (ref 11.5–15.5)
WBC: 5 10*3/uL (ref 4.0–10.5)
nRBC: 0 % (ref 0.0–0.2)

## 2023-07-07 LAB — COMPREHENSIVE METABOLIC PANEL
ALT: 15 U/L (ref 0–44)
AST: 24 U/L (ref 15–41)
Albumin: 4.4 g/dL (ref 3.5–5.0)
Alkaline Phosphatase: 37 U/L — ABNORMAL LOW (ref 38–126)
Anion gap: 10 (ref 5–15)
BUN: 16 mg/dL (ref 8–23)
CO2: 26 mmol/L (ref 22–32)
Calcium: 9.9 mg/dL (ref 8.9–10.3)
Chloride: 103 mmol/L (ref 98–111)
Creatinine, Ser: 1.03 mg/dL — ABNORMAL HIGH (ref 0.44–1.00)
GFR, Estimated: 56 mL/min — ABNORMAL LOW (ref >60)
Glucose, Bld: 102 mg/dL — ABNORMAL HIGH (ref 70–99)
Potassium: 4.4 mmol/L (ref 3.5–5.1)
Sodium: 139 mmol/L (ref 135–145)
Total Bilirubin: 2.5 mg/dL — ABNORMAL HIGH (ref 0.0–1.2)
Total Protein: 7.3 g/dL (ref 6.5–8.1)

## 2023-07-07 MED ORDER — FLUDEOXYGLUCOSE F - 18 (FDG) INJECTION
6.0200 | Freq: Once | INTRAVENOUS | Status: AC | PRN
  Administered 2023-07-07: 6.02 via INTRAVENOUS

## 2023-07-08 LAB — CEA: CEA: 1.8 ng/mL (ref 0.0–4.7)

## 2023-07-11 ENCOUNTER — Inpatient Hospital Stay: Payer: Medicare HMO

## 2023-07-11 ENCOUNTER — Inpatient Hospital Stay (HOSPITAL_BASED_OUTPATIENT_CLINIC_OR_DEPARTMENT_OTHER): Admitting: Hematology

## 2023-07-11 ENCOUNTER — Inpatient Hospital Stay

## 2023-07-11 ENCOUNTER — Inpatient Hospital Stay: Payer: Medicare HMO | Admitting: Hematology

## 2023-07-11 VITALS — BP 143/98 | HR 80 | Temp 97.8°F | Resp 16 | Wt 131.4 lb

## 2023-07-11 DIAGNOSIS — K769 Liver disease, unspecified: Secondary | ICD-10-CM | POA: Diagnosis not present

## 2023-07-11 DIAGNOSIS — Z85038 Personal history of other malignant neoplasm of large intestine: Secondary | ICD-10-CM | POA: Diagnosis not present

## 2023-07-11 DIAGNOSIS — C183 Malignant neoplasm of hepatic flexure: Secondary | ICD-10-CM | POA: Diagnosis not present

## 2023-07-11 DIAGNOSIS — R918 Other nonspecific abnormal finding of lung field: Secondary | ICD-10-CM | POA: Diagnosis not present

## 2023-07-11 MED ORDER — HEPARIN SOD (PORK) LOCK FLUSH 100 UNIT/ML IV SOLN
500.0000 [IU] | Freq: Once | INTRAVENOUS | Status: AC
  Administered 2023-07-11: 500 [IU] via INTRAVENOUS

## 2023-07-11 MED ORDER — SODIUM CHLORIDE 0.9% FLUSH
10.0000 mL | INTRAVENOUS | Status: DC | PRN
  Administered 2023-07-11: 10 mL via INTRAVENOUS

## 2023-07-11 NOTE — Progress Notes (Signed)
 Patricia Hurst presented for Portacath access and flush.  Portacath located left chest wall accessed with  H 20 needle.  No blood return noted. Portacath flushed with 20ml NS and 500U/65ml Heparin and needle removed intact and no bruising or swelling noted at the site. Procedure tolerated well and without incident.

## 2023-07-11 NOTE — Patient Instructions (Addendum)
 [facility] at [facility] Discharge Instructions   You were seen and examined today by Dr. Ellin Saba.  He reviewed the results of your lab work which are mostly normal/stable. Your bilirubin is elevated at 2.5. All other results are normal.   He reviewed the results of your PET scan. 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   We will need to obtain a new biopsy of the mass in the liver. This would be done in interventional radiology with CT guidance.   We would send special testing on the liver tissue once biopsied to see what treatment options are.   Return as scheduled.    Thank you for choosing [facility] at [facility] to provide your oncology and hematology care.  To afford each patient quality time with our provider, please arrive at least 15 minutes before your scheduled appointment time.   If you have a lab appointment with the Cancer Center please come in thru the Main Entrance and check in at the main information desk.  You need to re-schedule your appointment should you arrive 10 or more minutes late.  We strive to give you quality time with our providers, and arriving late affects you and other patients whose appointments are after yours.  Also, if you no show three or more times for appointments you may be dismissed from the clinic at the providers discretion.     Again, thank you for choosing [facility].  Our hope is that these requests will decrease the amount of time that you wait before being seen by our physicians.       _____________________________________________________________  Should you have questions after your visit to [facility], please contact our office at [phone] and follow the prompts.  Our office hours are 8:00 a.m. and 4:30 p.m. Monday - Friday.  Please note that voicemails left after 4:00 p.m. may not be  returned until the following business day.  We are closed weekends and major holidays.  You do have access to a nurse 24-7, just call the main number to the clinic [phone] and do not press any options, hold on the line and a nurse will answer the phone.    For prescription refill requests, have your pharmacy contact our office and allow 72 hours.    Due to Covid, you will need to wear a mask upon entering the hospital. If you do not have a mask, a mask will be given to you at the Main Entrance upon arrival. For doctor visits, patients may have 1 support person age 34 or older with them. For treatment visits, patients can not have anyone with them due to social distancing guidelines and our immunocompromised population.
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
## 2023-07-12 NOTE — Progress Notes (Signed)
 Irish Lack, MD  Claudean Kinds PROCEDURE / BIOPSY REVIEW Date: 07/12/23  Requested Biopsy site: Liver Reason for request: Liver lesion, right lobe Imaging review: PET  Decision: Approved Imaging modality to perform: Ultrasound Schedule with: Moderate Sedation Schedule for: Any VIR  Additional comments:   Please contact me with questions, concerns, or if issue pertaining to this request arise.  Reola Calkins, MD [facility]       Previous Messages    ----- Message ----- From: Claudean Kinds Sent: 07/12/2023   3:49 PM EDT To: Ir Procedure Requests Subject: FW: CT US Guided Biopsy                         ----- Message ----- From: Claudean Kinds Sent: 07/11/2023   3:58 PM EDT To: Claudean Kinds; Ir Procedure Requests Subject: CT US Guided Biopsy                            Procedure : CT US Guided Biopsy  Reason : liver lesion Dx: Liver lesion [K76.9 (ICD-10-CM)]; Malignant neoplasm of hepatic flexure (HCC) [C18.3 (ICD-10-CM)]    History : NM PET skull base to thigh , CT abd pelv w.  Provider : Doreatha Massed, MD  Provider contact : [phone]

## 2023-07-13 ENCOUNTER — Other Ambulatory Visit: Payer: Self-pay

## 2023-07-13 DIAGNOSIS — Z1231 Encounter for screening mammogram for malignant neoplasm of breast: Secondary | ICD-10-CM | POA: Diagnosis not present

## 2023-07-20 ENCOUNTER — Other Ambulatory Visit: Payer: Self-pay | Admitting: Radiology

## 2023-07-20 DIAGNOSIS — K769 Liver disease, unspecified: Secondary | ICD-10-CM

## 2023-07-20 DIAGNOSIS — C189 Malignant neoplasm of colon, unspecified: Secondary | ICD-10-CM | POA: Diagnosis not present
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
## 2023-07-21 ENCOUNTER — Ambulatory Visit: Admitting: Hematology

## 2023-07-21 ENCOUNTER — Other Ambulatory Visit (HOSPITAL_COMMUNITY): Admission: RE | Admit: 2023-07-21 | Source: Ambulatory Visit

## 2023-07-21 ENCOUNTER — Encounter

## 2023-07-21 ENCOUNTER — Ambulatory Visit (HOSPITAL_COMMUNITY)
Admission: RE | Admit: 2023-07-21 | Discharge: 2023-07-21 | Disposition: A | Discharge status: Home / Self Care | Source: Ambulatory Visit | Attending: Hematology | Admitting: Hematology

## 2023-07-21 ENCOUNTER — Other Ambulatory Visit: Payer: Self-pay

## 2023-07-21 DIAGNOSIS — K769 Liver disease, unspecified: Secondary | ICD-10-CM | POA: Diagnosis present

## 2023-07-21 DIAGNOSIS — Z7982 Long term (current) use of aspirin: Secondary | ICD-10-CM | POA: Diagnosis not present

## 2023-07-21 DIAGNOSIS — E039 Hypothyroidism, unspecified: Secondary | ICD-10-CM | POA: Diagnosis not present

## 2023-07-21 DIAGNOSIS — C183 Malignant neoplasm of hepatic flexure: Secondary | ICD-10-CM | POA: Insufficient documentation

## 2023-07-21 DIAGNOSIS — C787 Secondary malignant neoplasm of liver and intrahepatic bile duct: Secondary | ICD-10-CM | POA: Insufficient documentation

## 2023-07-21 DIAGNOSIS — Z9049 Acquired absence of other specified parts of digestive tract: Secondary | ICD-10-CM | POA: Diagnosis not present

## 2023-07-21 DIAGNOSIS — R160 Hepatomegaly, not elsewhere classified: Secondary | ICD-10-CM | POA: Diagnosis not present

## 2023-07-21 DIAGNOSIS — I340 Nonrheumatic mitral (valve) insufficiency: Secondary | ICD-10-CM | POA: Insufficient documentation

## 2023-07-21 DIAGNOSIS — E7800 Pure hypercholesterolemia, unspecified: Secondary | ICD-10-CM | POA: Diagnosis not present

## 2023-07-21 DIAGNOSIS — C189 Malignant neoplasm of colon, unspecified: Secondary | ICD-10-CM | POA: Diagnosis not present

## 2023-07-21 LAB — CBC
HCT: 34 % — ABNORMAL LOW (ref 36.0–46.0)
Hemoglobin: 12.2 g/dL (ref 12.0–15.0)
MCH: 32.3 pg (ref 26.0–34.0)
MCHC: 35.9 g/dL (ref 30.0–36.0)
MCV: 89.9 fL (ref 80.0–100.0)
Platelets: 162 10*3/uL (ref 150–400)
RBC: 3.78 MIL/uL — ABNORMAL LOW (ref 3.87–5.11)
RDW: 11.9 % (ref 11.5–15.5)
WBC: 5.8 10*3/uL (ref 4.0–10.5)
nRBC: 0 % (ref 0.0–0.2)

## 2023-07-21 LAB — PROTIME-INR
INR: 1 (ref 0.8–1.2)
Prothrombin Time: 13.4 s (ref 11.4–15.2)

## 2023-07-21 MED ORDER — MIDAZOLAM HCL 2 MG/2ML IJ SOLN
INTRAMUSCULAR | Status: AC | PRN
  Administered 2023-07-21: .5 mg via INTRAVENOUS
  Administered 2023-07-21: 1 mg via INTRAVENOUS
  Administered 2023-07-21: .5 mg via INTRAVENOUS

## 2023-07-21 MED ORDER — LIDOCAINE HCL (PF) 1 % IJ SOLN
10.0000 mL | Freq: Once | INTRAMUSCULAR | Status: AC
  Administered 2023-07-21: 10 mL via INTRADERMAL

## 2023-07-21 MED ORDER — FENTANYL CITRATE (PF) 100 MCG/2ML IJ SOLN
INTRAMUSCULAR | Status: AC
  Filled 2023-07-21: qty 2

## 2023-07-21 MED ORDER — SODIUM CHLORIDE 0.9 % IV SOLN: INTRAVENOUS | Status: DC

## 2023-07-21 MED ORDER — FENTANYL CITRATE (PF) 100 MCG/2ML IJ SOLN
INTRAMUSCULAR | Status: AC | PRN
  Administered 2023-07-21: 50 ug via INTRAVENOUS
  Administered 2023-07-21: 25 ug via INTRAVENOUS

## 2023-07-21 MED ORDER — GELATIN ABSORBABLE 12-7 MM EX MISC
1.0000 | Freq: Once | CUTANEOUS | Status: AC
  Administered 2023-07-21: 1 via TOPICAL

## 2023-07-21 MED ORDER — MIDAZOLAM HCL 2 MG/2ML IJ SOLN
INTRAMUSCULAR | Status: AC
  Filled 2023-07-21: qty 2

## 2023-07-21 NOTE — Progress Notes (Signed)
 Patient walked to the bathroom without difficulties

## 2023-07-21 NOTE — Procedures (Signed)
 Interventional Radiology Procedure Note  Procedure: Ultrasound guided liver mass biopsy  Findings: Please refer to procedural dictation for full description. 18 ga core x3 from right lobe mass. Gelfoam slurry needle track embolization.  Complications: None immediate  Estimated Blood Loss: <5 ml  Recommendations: 3 hour bedrest. Follow Pathology results.   Marliss Coots, MD
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
## 2023-07-28 ENCOUNTER — Inpatient Hospital Stay: Attending: Hematology | Admitting: Hematology

## 2023-07-28 ENCOUNTER — Other Ambulatory Visit: Payer: Self-pay | Admitting: *Deleted

## 2023-07-28 VITALS — BP 169/54 | HR 76 | Temp 97.9°F | Resp 18 | Wt 130.5 lb

## 2023-07-28 DIAGNOSIS — R918 Other nonspecific abnormal finding of lung field: Secondary | ICD-10-CM | POA: Diagnosis not present

## 2023-07-28 DIAGNOSIS — F419 Anxiety disorder, unspecified: Secondary | ICD-10-CM | POA: Insufficient documentation

## 2023-07-28 DIAGNOSIS — C787 Secondary malignant neoplasm of liver and intrahepatic bile duct: Secondary | ICD-10-CM | POA: Diagnosis not present

## 2023-07-28 DIAGNOSIS — I340 Nonrheumatic mitral (valve) insufficiency: Secondary | ICD-10-CM | POA: Diagnosis not present

## 2023-07-28 DIAGNOSIS — C183 Malignant neoplasm of hepatic flexure: Secondary | ICD-10-CM | POA: Diagnosis not present

## 2023-07-28 MED ORDER — ALPRAZOLAM 0.25 MG PO TABS: 0.2500 mg | ORAL_TABLET | Freq: Every day | ORAL | 2 refills | Status: DC | PRN

## 2023-07-28 NOTE — Patient Instructions (Addendum)
 [facility] at [facility] Discharge Instructions   You were seen and examined today by Dr. Ellin Saba.  He reviewed the results of your biopsy. 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 This means the cancer is not curable, but it is treatable.    He reviewed the results of the special blood test we sent looking for mutations to target. This shows a BRAF mutation, as well as several other mutations.   We also sent special testing (NGS) on the tissue of the biopsy. These results will take 2-3 weeks to result. This test will help guide treatment options, as well as confirm where this cancer is coming from.   He discussed treatment options with you to include a pill, an antibody, and chemotherapy.   We will see you back after the NGS results are back to review those.   Return as scheduled.    Thank you for choosing [facility] at [facility] to provide your oncology and hematology care.  To afford each patient quality time with our provider, please arrive at least 15 minutes before your scheduled appointment time.   If you have a lab appointment with the Cancer Center please come in thru the Main Entrance and check in at the main information desk.  You need to re-schedule your appointment should you arrive 10 or more minutes late.  We strive to give you quality time with our providers, and arriving late affects you and other patients whose appointments are after yours.  Also, if you no show three or more times for appointments you may be dismissed from the clinic at the providers discretion.     Again, thank you for choosing [facility].  Our hope is that these requests will decrease the amount of time that you wait before being seen by our physicians.       _____________________________________________________________  Should you have questions after your visit to [facility], please  contact our office at [phone] and follow the prompts.  Our office hours are 8:00 a.m. and 4:30 p.m. Monday - Friday.  Please note that voicemails left after 4:00 p.m. may not be returned until the following business day.  We are closed weekends and major holidays.  You do have access to a nurse 24-7, just call the main number to the clinic [phone] and do not press any options, hold on the line and a nurse will answer the phone.    For prescription refill requests, have your pharmacy contact our office and allow 72 hours.    Due to Covid, you will need to wear a mask upon entering the hospital. If you do not have a mask, a mask will be given to you at the Main Entrance upon arrival. For doctor visits, patients may have 1 support person age 27 or older with them. For treatment visits, patients can not have anyone with them due to social distancing guidelines and our immunocompromised population.

## 2023-07-29 ENCOUNTER — Other Ambulatory Visit: Payer: Self-pay

## 2023-08-09 LAB — SURGICAL PATHOLOGY

## 2023-08-10 ENCOUNTER — Inpatient Hospital Stay: Admitting: Hematology

## 2023-08-14 DIAGNOSIS — C189 Malignant neoplasm of colon, unspecified: Secondary | ICD-10-CM | POA: Diagnosis not present

## 2023-08-14 DIAGNOSIS — C787 Secondary malignant neoplasm of liver and intrahepatic bile duct: Secondary | ICD-10-CM | POA: Diagnosis not present

## 2023-08-16 ENCOUNTER — Ambulatory Visit (HOSPITAL_COMMUNITY)
Admission: RE | Admit: 2023-08-16 | Discharge: 2023-08-16 | Disposition: A | Discharge status: Home / Self Care | Source: Ambulatory Visit | Attending: Hematology | Admitting: Hematology

## 2023-08-16 ENCOUNTER — Encounter (HOSPITAL_COMMUNITY): Payer: Self-pay

## 2023-08-16 DIAGNOSIS — I340 Nonrheumatic mitral (valve) insufficiency: Secondary | ICD-10-CM | POA: Diagnosis not present

## 2023-08-16 LAB — ECHOCARDIOGRAM COMPLETE
AR max vel: 2.29 cm2
AV Area VTI: 2.13 cm2
AV Area mean vel: 2.22 cm2
AV Mean grad: 3.2 mmHg
AV Peak grad: 6.7 mmHg
Ao pk vel: 1.29 m/s
Area-P 1/2: 4.39 cm2
S' Lateral: 2.4 cm

## 2023-08-16 NOTE — Progress Notes (Signed)
*  PRELIMINARY RESULTS* Echocardiogram 2D Echocardiogram has been performed.  Bernis Brisker 08/16/2023, 10:32 AM

## 2023-08-18 ENCOUNTER — Ambulatory Visit: Admitting: Hematology
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
## 2023-08-23 ENCOUNTER — Telehealth: Payer: Self-pay | Admitting: Pharmacy Technician

## 2023-08-23 ENCOUNTER — Other Ambulatory Visit (HOSPITAL_COMMUNITY): Payer: Self-pay

## 2023-08-23 ENCOUNTER — Inpatient Hospital Stay (HOSPITAL_BASED_OUTPATIENT_CLINIC_OR_DEPARTMENT_OTHER): Admitting: Hematology

## 2023-08-23 ENCOUNTER — Encounter: Payer: Self-pay | Admitting: Hematology

## 2023-08-23 ENCOUNTER — Encounter (HOSPITAL_COMMUNITY): Payer: Self-pay | Admitting: Hematology

## 2023-08-23 ENCOUNTER — Telehealth: Payer: Self-pay | Admitting: Pharmacist

## 2023-08-23 VITALS — BP 134/89 | HR 70 | Temp 96.6°F | Resp 18 | Wt 130.3 lb

## 2023-08-23 DIAGNOSIS — F419 Anxiety disorder, unspecified: Secondary | ICD-10-CM | POA: Diagnosis not present

## 2023-08-23 DIAGNOSIS — C182 Malignant neoplasm of ascending colon: Secondary | ICD-10-CM

## 2023-08-23 DIAGNOSIS — C189 Malignant neoplasm of colon, unspecified: Secondary | ICD-10-CM

## 2023-08-23 DIAGNOSIS — C787 Secondary malignant neoplasm of liver and intrahepatic bile duct: Secondary | ICD-10-CM | POA: Diagnosis not present

## 2023-08-23 DIAGNOSIS — R918 Other nonspecific abnormal finding of lung field: Secondary | ICD-10-CM | POA: Diagnosis not present

## 2023-08-23 DIAGNOSIS — C183 Malignant neoplasm of hepatic flexure: Secondary | ICD-10-CM | POA: Diagnosis not present

## 2023-08-23 DIAGNOSIS — Z95828 Presence of other vascular implants and grafts: Secondary | ICD-10-CM

## 2023-08-23 MED ORDER — LIDOCAINE-PRILOCAINE 2.5-2.5 % EX CREA: TOPICAL_CREAM | CUTANEOUS | 3 refills | Status: AC

## 2023-08-23 MED ORDER — PROCHLORPERAZINE MALEATE 10 MG PO TABS: 10.0000 mg | ORAL_TABLET | Freq: Four times a day (QID) | ORAL | 3 refills | Status: AC | PRN

## 2023-08-23 MED ORDER — ENCORAFENIB 75 MG PO CAPS
225.0000 mg | ORAL_CAPSULE | Freq: Every day | ORAL | 0 refills | Status: DC
  Filled 2023-08-24: qty 90, 30d supply, fill #0

## 2023-08-23 NOTE — Patient Instructions (Signed)
 [facility] at [facility] Discharge Instructions   You were seen and examined today by Dr. Cheree Cords.  He reviewed the results of your molecular testing. It is showing you would benefit from a combination of chemotherapy, a monoclonal antibody, and a pill. The pills you take daily at home. The infusions you will come to the clinic to receive every 2 weeks.   Return as scheduled.    Thank you for choosing [facility] at [facility] to provide your oncology and hematology care.  To afford each patient quality time with our provider, please arrive at least 15 minutes before your scheduled appointment time.   If you have a lab appointment with the Cancer Center please come in thru the Main Entrance and check in at the main information desk.  You need to re-schedule your appointment should you arrive 10 or more minutes late.  We strive to give you quality time with our providers, and arriving late affects you and other patients whose appointments are after yours.  Also, if you no show three or more times for appointments you may be dismissed from the clinic at the providers discretion.     Again, thank you for choosing [facility].  Our hope is that these requests will decrease the amount of time that you wait before being seen by our physicians.       _____________________________________________________________  Should you have questions after your visit to [facility], please contact our office at [phone] and follow the prompts.  Our office hours are 8:00 a.m. and 4:30 p.m. Monday - Friday.  Please note that voicemails left after 4:00 p.m. may not be returned until the following business day.  We are closed weekends and major holidays.  You do have access to a nurse 24-7, just call the main number to the clinic [phone] and do not press any options, hold on the line and a nurse will answer the phone.    For  prescription refill requests, have your pharmacy contact our office and allow 72 hours.    Due to Covid, you will need to wear a mask upon entering the hospital. If you do not have a mask, a mask will be given to you at the Main Entrance upon arrival. For doctor visits, patients may have 1 support person age 71 or older with them. For treatment visits, patients can not have anyone with them due to social distancing guidelines and our immunocompromised population.

## 2023-08-23 NOTE — Patient Instructions (Signed)
 [facility] Chemotherapy Teaching   You have been diagnosed with Stage 4 Colon cancer.  You will receive the treatments ERBITUX, Oxaliplatin , and adrucil  every 2 weeks.  The intent of treatment is Palliative meaning to keep the cancer under control and prevent any further spread.   You will see the doctor regularly throughout treatment.  We will obtain blood work from you prior to every treatment and monitor your results to make sure it is safe to give your treatment. The doctor monitors your response to treatment by the way you are feeling, your blood work, and by obtaining scans periodically.  There will be wait times while you are here for treatment.  It will take about 30 minutes to 1 hour for your lab work to result.  Then there will be wait times while pharmacy mixes your medications.   Medications you will receive in the clinic prior to your chemotherapy medications:   Aloxi :  ALOXI  is used in adults to help prevent the nausea and vomiting that happens with certain chemotherapy drugs.  Aloxi  is a long acting medication, and will remain in your system for about 2 days.    Dexamethasone :  This is a steroid given prior to chemotherapy to help prevent allergic reactions; it may also help prevent and control nausea and diarrhea.    Pepcid :  This medication is a histamine blocker that helps prevent and allergic reaction to your chemotherapy.    Benadryl :  This is a histamine blocker (different from the Pepcid ) that helps prevent allergic/infusion reactions to your chemotherapy. This medication may cause dizziness/drowsiness.   Singulair-same as Benadryl .   About Cetuximab (Erbitux)  Monoclonal antibodies are created in a lab to attach to the targets found on specific types of cancer cells. The antibody "calls" the immune system to attack the cell it is attached to, resulting in the immune system killing the cell. These antibodies can work in different ways, including stimulating  the immune system to kill the cell, blocking cell growth or other functions necessary for cell growth.  Possible Side Effects  Infusion Reaction Some patients will develop a reaction to the medication. This most commonly occurs with the first dose. Reactions can cause chills, fever, shortness of breath, difficulty breathing, hoarseness, itching, or low blood pressure. Tell your nurse right away if you experience any of these. You will be given medication prior to the infusion to help prevent this reaction.   You will be monitored for at least 1 hour after the completion of your infusion.  Heart Problems Cetuximab can cause heart problems including cardiac arrest and heart attack. Patients with a prior history of coronary artery disease and/or receiving radiation therapy are at highest risk. Notify your healthcare team or go to the emergency room immediately if you experience chest pain, shortness of breath, or feel dizzy or faint.  Electrolyte Abnormalities This medication can impact the electrolyte levels in your blood; including magnesium , calcium , and potassium. This can even occur after the completion of treatment. Your healthcare team will monitor your electrolyte levels during treatment, and for at least 8 weeks following the completion of treatment.  Nail and Skin Changes Cetuximab has some unique nail and skin side effects that you may develop. Patients may develop a rash. While this rash may look like acne, it is not, and should not be treated with acne medications. The rash may appear red, swollen, crusty and dry, and feel sore. You may also develop very dry skin, which may  crack, be itchy, or become flaky or scaly. The rash may be the worst during the first few weeks of treatment but may continue until treatment is stopped. Tips for managing your skin include:  Use a thick, alcohol -free emollient lotion or cream on your skin at least twice a day, including right after bathing. Sun  exposure can worsen the rash. Use a sunscreen with an SPF of 30 or higher and wear a hat and sunglasses to protect your head and face from the sun. Use soaps, lotions, and laundry detergents without alcohol , perfumes, or dyes. Wear gloves to wash dishes or do housework or gardening. Drink plenty of water  and try not to scratch or rub your skin. Notify your healthcare team if you develop a rash as they may have management suggestions and/or prescribe a topical medication to apply to the rash or an oral medication. While receiving cetuximab, you may develop an inflammation of the skin around the nail bed/cuticle areas of toes or fingers, which is called paronychia. It can appear red, swollen or pus filled. Nails may develop "ridges" in them or fall off. You may also develop cuts or cracks that look like small paper cuts in the skin on your toes, fingers or knuckles. These side effects may appear several months after starting treatment but can last for many months after treatment stops.   Sun Sensitivity This medication can make your skin more sensitive to the sun, which can result in severe sunburn or rash. Paulene Boron sensitivity can last even after chemotherapy is completed. Limit sun exposure while receiving this medication, and for two months following the last dose. Avoid the sun between 10-2pm, when it is strongest. Wear sunscreen (at least SPF 15) everyday; wear sunglasses, a hat, and long sleeves/pants to protect your skin and seek out shade whenever possible.  Fatigue Fatigue is very common during cancer treatment and is an overwhelming feeling of exhaustion that is not usually relieved by rest. While on cancer treatment, and for a period after, you may need to adjust your schedule to manage fatigue. Plan times to rest during the day and conserve energy for more important activities. Exercise can help combat fatigue; a simple daily walk with a friend can help. Talk to your healthcare team for helpful tips  on dealing with this side effect.  Nausea and/or Vomiting Talk to your oncology care team so they can prescribe medications to help you manage nausea and vomiting. In addition, dietary changes may help. Avoid things that may worsen the symptoms, such as heavy or greasy/fatty, spicy, or acidic foods (lemons, tomatoes, oranges). Try saltines, or ginger ale to lessen symptoms.  Call your oncology care team if you are unable to keep fluids down for more than 12 hours or if you feel lightheaded or dizzy at any time.  Muscle or Joint Pain/Aches and Weakness Your healthcare provider can recommend medications and other strategies to help relieve pain.  Important but Less Common Side Effects Hair Changes: While receiving cetuximab, your eyelashes may grow very fast, become very long, and bother your eyes. Speak to your provider about how to best manage this side effect. The hair on your head may become curly, fine, or brittle. These changes tend to resolve once treatment is stopped.  Lung Problems: Cetuximab can cause interstitial lung disease (ILD), especially in those with pre-existing lung problems. You may have breathing tests (pulmonary function tests) performed periodically. Call your physician right away if you have shortness of breath, cough, wheezing, or difficulty  breathing.   Oxaliplatin  (Eloxatin )  About This Drug  Oxaliplatin  is used to treat cancer. It is given in the vein (IV).  It takes two hours to infuse.  Possible Side Effects   Bone marrow suppression. This is a decrease in the number of white blood cells, red blood cells, and platelets. This may raise your risk of infection, make you tired and weak (fatigue), and raise your risk of bleeding.   Tiredness   Soreness of the mouth and throat. You may have red areas, white patches, or sores that hurt.   Nausea and vomiting (throwing up)   Diarrhea (loose bowel movements)   Changes in your liver function   Effects on the  nerves called peripheral neuropathy. You may feel numbness, tingling, or pain in your hands and feet, and may be worse in cold temperatures. It may be hard for you to button your clothes, open jars, or walk as usual. The effect on the nerves may get worse with more doses of the drug. These effects get better in some people after the drug is stopped but it does not get better in all people  Note: Each of the side effects above was reported in 40% or greater of patients treated with oxaliplatin . Not all possible side effects are included above.   Warnings and Precautions   Allergic reactions, including anaphylaxis, which may be life-threatening are rare but may happen in some patients. Signs of allergic reaction to this drug may be swelling of the face, feeling like your tongue or throat are swelling, trouble breathing, rash, itching, fever, chills, feeling dizzy, and/or feeling that your heart is beating in a fast or not normal way. If this happens, do not take another dose of this drug. You should get urgent medical treatment.   Inflammation (swelling) of the lungs, which may be life-threatening. You may have a dry cough or trouble breathing.   Effects on the nerves (neuropathy) may resolve within 14 days, or it may persist beyond 14 days.   Severe decrease in white blood cells when combined with the chemotherapy agents 5-fluorouracil  and leucovorin . This may be life-threatening.   Severe changes in your liver function   Abnormal heart beat and/or EKG, which can be life-threatening   Rhabdomyolysis- damage to your muscles which may release proteins in your blood and affect how your kidneys work, which can be life-threatening. You may have severe muscle weakness and/or pain, or dark urine.  Important Information   This drug may impair your ability to drive or use machinery. Talk to your doctor and/or nurse about precautions you may need to take.   This drug may be present in the saliva,  tears, sweat, urine, stool, vomit, semen, and vaginal secretions. Talk to your doctor and/or your nurse about the necessary precautions to take during this time.  * The effects on the nerves can be aggravated by exposure to cold. Avoid cold beverages, use of ice and make sure you cover your skin and dress warmly prior to being exposed to cold temperatures while you are receiving treatment with oxaliplatin *   Treating Side Effects   Manage tiredness by pacing your activities for the day.   Be sure to include periods of rest between energy-draining activities.   To decrease the risk of infection, wash your hands regularly.   Avoid close contact with people who have a cold, the flu, or other infections.  Take your temperature as your doctor or nurse tells you, and whenever you feel  like you may have a fever.   To help decrease the risk of bleeding, use a soft toothbrush. Check with your nurse before using dental floss.   Be very careful when using knives or tools.   Use an electric shaver instead of a razor.   Drink plenty of fluids (a minimum of eight glasses per day is recommended).   Mouth care is very important. Your mouth care should consist of routine, gentle cleaning of your teeth or dentures and rinsing your mouth with a mixture of 1/2 teaspoon of salt in 8 ounces of water  or 1/2 teaspoon of baking soda in 8 ounces of water . This should be done at least after each meal and at bedtime.   If you have mouth sores, avoid mouthwash that has alcohol . Also avoid alcohol  and smoking because they can bother your mouth and throat.   To help with nausea and vomiting, eat small, frequent meals instead of three large meals a day. Choose foods and drinks that are at room temperature. Ask your nurse or doctor about other helpful tips and medicine that is available to help stop or lessen these symptoms.   If you throw up or have loose bowel movements, you should drink more fluids so that you do not  become dehydrated (lack of water  in the body from losing too much fluid).   If you have diarrhea, eat low-fiber foods that are high in protein and calories and avoid foods that can irritate your digestive tracts or lead to cramping.   Ask your nurse or doctor about medicine that can lessen or stop your diarrhea.   If you have numbness and tingling in your hands and feet, be careful when cooking, walking, and handling sharp objects and hot liquids.   Do not drink cold drinks or use ice in beverages. Drink fluids at room temperature or warmer, and drink through a straw.   Wear gloves to touch cold objects, and wear warm clothing and cover you skin during cold weather.   Food and Drug Interactions   There are no known interactions of oxaliplatin  with food and other medications.   This drug may interact with other medicines. Tell your doctor and pharmacist about all the prescription and over-the-counter medicines and dietary supplements (vitamins, minerals, herbs and others) that you are taking at this time. Also, check with your doctor or pharmacist before starting any new prescription or over-the-counter medicines, or dietary supplements to make sure that there are no interactions   When to Call the Doctor  Call your doctor or nurse if you have any of these symptoms and/or any new or unusual symptoms:   Fever of 100.4 F (38 C) or higher   Chills   Tiredness that interferes with your daily activities   Feeling dizzy or lightheaded   Easy bleeding or bruising   Feeling that your heart is beating in a fast or not normal way (palpitations)   Pain in your chest   Dry cough   Trouble breathing   Pain in your mouth or throat that makes it hard to eat or drink   Nausea that stops you from eating or drinking and/or is not relieved by prescribed medicines   Throwing up   Diarrhea, 4 times in one day or diarrhea with lack of strength or a feeling of being dizzy   Numbness,  tingling, or pain in your hands and feet   Signs of possible liver problems: dark urine, pale bowel movements, bad stomach  pain, feeling very tired and weak, unusual itching, or yellowing of the eyes or skin   Signs of rhabdomyolysis: decreased urine, very dark urine, muscle pain in the shoulders, thighs, or lower back; muscle weakness or trouble moving arms and legs   Signs of allergic reaction: swelling of the face, feeling like your tongue or throat are swelling, trouble breathing, rash, itching, fever, chills, feeling dizzy, and/or feeling that your heart is beating in a fast or not normal way. If this happens, call 911 for emergency care.   If you think you may be pregnant   Leucovorin  Calcium   About This Drug  Leucovorin  is a vitamin. It is used in combination with other cancer fighting drugs such as 5-fluorouracil  and methotrexate. Leucovorin  is given in the vein (IV).  This drug runs at the same time as the oxaliplatin  and takes 2 hours to infuse.   Possible Side Effects  Rash and itching  Note: Leucovorin  by itself has very few side effects. Other side effects you may have can be caused by the other drugs you are taking, such as 5-fluorouracil .   Warnings and Precautions   Allergic reactions, including anaphylaxis are rare but may happen in some patients. Signs of allergic reaction to this drug may be swelling of the face, feeling like your tongue or throat are swelling, trouble breathing, rash, itching, fever, chills, feeling dizzy, and/or feeling that your heart is beating in a fast or not normal way. If this happens, do not take another dose of this drug. You should get urgent medical treatment.  Food and Drug Interactions   There are no known interactions of leucovorin  with food.   This drug may interact with other medicines. Tell your doctor and pharmacist about all the prescription and over-the-counter medicines and dietary supplements (vitamins, minerals, herbs and  others) that you are taking at this time.   Also, check with your doctor or pharmacist before starting any new prescription or over-the-counter medicines, or dietary supplements to make sure that there are no interactions.    5-Fluorouracil  (Adrucil ; 5FU)  About This Drug  Fluorouracil  is used to treat cancer. It is given in the vein (IV). It is given as an IV push from a syringe and also as a continuous infusion given via an ambulatory pump (a pump you take home and wear for a specified amount of time).  Possible Side Effects   Bone marrow suppression. This is a decrease in the number of white blood cells, red blood cells, and platelets. This may raise your risk of infection, make you tired and weak (fatigue), and raise your risk of bleeding   Changes in the tissue of the heart and/or heart attack. Some changes may happen that can cause your heart to have less ability to pump blood.   Blurred vision or other changes in eyesight   Nausea and throwing up (vomiting)   Diarrhea (loose bowel movements)   Ulcers - sores that may cause pain or bleeding in your digestive tract, which includes your mouth, esophagus, stomach, small/large intestines and rectum   Soreness of the mouth and throat. You may have red areas, white patches, or sores that hurt.   Allergic reactions, including anaphylaxis are rare but may happen in some patients. Signs of allergic reaction to this drug may be swelling of the face, feeling like your tongue or throat are swelling, trouble breathing, rash, itching, fever, chills, feeling dizzy, and/or feeling that your heart is beating in a fast or not  normal way. If this happens, do not take another dose of this drug. You should get urgent medical treatment.   Sensitivity to light (photosensitivity). Photosensitivity means that you may become more sensitive to the sun and/or light. You may get a skin rash/reaction if you are in the sun or are exposed to sun lamps and  tanning beds. Your eyes may water  more, mostly in bright light.   Changes in your nail color, nail loss and/or brittle nail   Darkening of the skin, or changes to the color of your skin and/or veins used for infusion   Rash, dry skin, or itching  Note: Not all possible side effects are included above.  Warnings and Precautions   Hand-and-foot syndrome. The palms of your hands or soles of your feet may tingle, become numb, painful, swollen, or red.   Changes in your central nervous system can happen. The central nervous system is made up of your brain and spinal cord. You could feel extreme tiredness, agitation, confusion, hallucinations (see or hear things that are not there), trouble understanding or speaking, loss of control of your bowels or bladder, eyesight changes, numbness or lack of strength to your arms, legs, face, or body, or coma. If you start to have any of these symptoms let your doctor know right away.   Side effects of this drug may be unexpectedly severe in some patients  Note: Some of the side effects above are very rare. If you have concerns and/or questions, please discuss them with your medical team.   Important Information   This drug may be present in the saliva, tears, sweat, urine, stool, vomit, semen, and vaginal secretions. Talk to your doctor and/or your nurse about the necessary precautions to take during this time.   Treating Side Effects   Manage tiredness by pacing your activities for the day.   Be sure to include periods of rest between energy-draining activities.   To help decrease the risk of infections, wash your hands regularly.   Avoid close contact with people who have a cold, the flu, or other infections.   Take your temperature as your doctor or nurse tells you, and whenever you feel like you may have a fever.   Use a soft toothbrush. Check with your nurse before using dental floss.   Be very careful when using knives or tools.   Use  an electric shaver instead of a razor.   If you have a nose bleed, sit with your head tipped slightly forward. Apply pressure by lightly pinching the bridge of your nose between your thumb and forefinger. Call your doctor if you feel dizzy or faint or if the bleeding doesn't stop after 10 to 15 minutes.   Drink plenty of fluids (a minimum of eight glasses per day is recommended).   If you throw up or have loose bowel movements, you should drink more fluids so that you do not  become dehydrated (lack of water  in the body from losing too much fluid).   To help with nausea and vomiting, eat small, frequent meals instead of three large meals a day. Choose foods and drinks that are at room temperature. Ask your nurse or doctor about other helpful tips and medicine that is available to help, stop, or lessen these symptoms.   If you have diarrhea, eat low-fiber foods that are high in protein and calories and avoid foods that can irritate your digestive tracts or lead to cramping.   Ask your nurse or  doctor about medicine that can lessen or stop your diarrhea.   Mouth care is very important. Your mouth care should consist of routine, gentle cleaning of your teeth or dentures and rinsing your mouth with a mixture of 1/2 teaspoon of salt in 8 ounces of water  or 1/2 teaspoon of baking soda in 8 ounces of water . This should be done at least after each meal and at bedtime.   If you have mouth sores, avoid mouthwash that has alcohol . Also avoid alcohol  and smoking because they can bother your mouth and throat.   Keeping your nails moisturized may help with brittleness.   To help with itching, moisturize your skin several times day.   Use sunscreen with SPF 30 or higher when you are outdoors even for a short time. Cover up when you are out in the sun. Wear wide-brimmed hats, long-sleeved shirts, and pants. Keep your neck, chest, and back covered. Wear dark sun glasses when in the sun or bright lights.    If you get a rash do not put anything on it unless your doctor or nurse says you may. Keep the area around the rash clean and dry. Ask your doctor for medicine if your rash bothers you.   Keeping your pain under control is important to your well-being. Please tell your doctor or nurse if you are experiencing pain.   Food and Drug Interactions   There are no known interactions of fluorouracil  with food.   Check with your doctor or pharmacist about all other prescription medicines and over-the-counter medicines and dietary supplements (vitamins, minerals, herbs and others) you are taking before starting this medicine as there are known drug interactions with 5-fluoroucacil. Also, check with your doctor or pharmacist before starting any new prescription or over-the-counter medicines, or dietary supplements to make sure that there are no interactions.  When to Call the Doctor  Call your doctor or nurse if you have any of these symptoms and/or any new or unusual symptoms:   Fever of 100.4 F (38 C) or higher   Chills   Easy bleeding or bruising   Nose bleed that doesn't stop bleeding after 10-15 minutes   Trouble breathing   Feeling dizzy or lightheaded   Feeling that your heart is beating in a fast or not normal way (palpitations)   Chest pain or symptoms of a heart attack. Most heart attacks involve pain in the center of the chest that lasts more than a few minutes. The pain may go away and come back or it can be constant. It can feel like pressure, squeezing, fullness, or pain. Sometimes pain is felt in one or both arms, the back, neck, jaw, or stomach. If any of these symptoms last 2 minutes, call 911.   Confusion and/or agitation   Hallucinations   Trouble understanding or speaking   Loss of control of bowels or bladder   Blurry vision or changes in your eyesight   Headache that does not go away   Numbness or lack of strength to your arms, legs, face, or body   Nausea  that stops you from eating or drinking and/or is not relieved by prescribed medicines   Throwing up more than 3 times a day   Diarrhea, 4 times in one day or diarrhea with lack of strength or a feeling of being dizzy   Pain in your mouth or throat that makes it hard to eat or drink   Pain along the digestive tract - especially if  worse after eating   Blood in your vomit (bright red or coffee-ground) and/or stools (bright red, or black/tarry)   Coughing up blood   Tiredness that interferes with your daily activities   Painful, red, or swollen areas on your hands or feet or around your nails   A new rash or a rash that is not relieved by prescribed medicines   Develop sensitivity to sunlight/light   Numbness and/or tingling of your hands and/or feet   Signs of allergic reaction: swelling of the face, feeling like your tongue or throat are swelling, trouble breathing, rash, itching, fever, chills, feeling dizzy, and/or feeling that your heart is beating in a fast or not normal way. If this happens, call 911 for emergency care.   If you think you are pregnant or may have impregnated your partner  Reproduction Warnings   Pregnancy warning: This drug may have harmful effects on the unborn baby. Women of child bearing potential should use effective methods of birth control during your cancer treatment and 3 months after treatment. Men with female partners of childbearing potential should use effective methods of birth control during your cancer treatment and for 3 months after your cancer treatment. Let your doctor know right away if you think you may be pregnant or may have impregnated your partner.   Breastfeeding warning: It is not known if this drug passes into breast milk. For this reason, Women should not breastfeed during treatment because this drug could enter the breast milk and cause harm to a breastfeeding baby.   Fertility warning: In men and women both, this drug may affect  your ability to have children in the future. Talk with your doctor or nurse if you plan to have children. Ask for information on sperm or egg banking.   SELF CARE ACTIVITIES WHILE RECEIVING CHEMOTHERAPY:  Hydration Increase your fluid intake 48 hours prior to treatment and drink at least 8 to 12 cups (64 ounces) of water /decaffeinated beverages per day after treatment. You can still have your cup of coffee or soda but these beverages do not count as part of your 8 to 12 cups that you need to drink daily. No alcohol  intake.  Medications Continue taking your normal prescription medication as prescribed.  If you start any new herbal or new supplements please let us  know first to make sure it is safe.  Mouth Care Have teeth cleaned professionally before starting treatment. Keep dentures and partial plates clean. Use soft toothbrush and do not use mouthwashes that contain alcohol . Biotene is a good mouthwash that is available at most pharmacies or may be ordered by calling [phone]. Use warm salt water  gargles (1 teaspoon salt per 1 quart warm water ) before and after meals and at bedtime. If you need dental work, please let the doctor know before you go for your appointment so that we can coordinate the best possible time for you in regards to your chemo regimen. You need to also let your dentist know that you are actively taking chemo. We may need to do labs prior to your dental appointment.  Skin Care Always use sunscreen that has not expired and with SPF (Sun Protection Factor) of 50 or higher. Wear hats to protect your head from the sun. Remember to use sunscreen on your hands, ears, face, & feet.  Use good moisturizing lotions such as udder cream, eucerin, or even Vaseline. Some chemotherapies can cause dry skin, color changes in your skin and nails.    Avoid  long, hot showers or baths. Use gentle, fragrance-free soaps and laundry detergent. Use moisturizers, preferably creams or ointments  rather than lotions because the thicker consistency is better at preventing skin dehydration. Apply the cream or ointment within 15 minutes of showering. Reapply moisturizer at night, and moisturize your hands every time after you wash them.  Hair Loss (if your doctor says your hair will fall out)  If your doctor says that your hair is likely to fall out, decide before you begin chemo whether you want to wear a wig. You may want to shop before treatment to match your hair color. Hats, turbans, and scarves can also camouflage hair loss, although some people prefer to leave their heads uncovered. If you go bare-headed outdoors, be sure to use sunscreen on your scalp. Cut your hair short. It eases the inconvenience of shedding lots of hair, but it also can reduce the emotional impact of watching your hair fall out. Don't perm or color your hair during chemotherapy. Those chemical treatments are already damaging to hair and can enhance hair loss. Once your chemo treatments are done and your hair has grown back, it's OK to resume dyeing or perming hair.  With chemotherapy, hair loss is almost always temporary. But when it grows back, it may be a different color or texture. In older adults who still had hair color before chemotherapy, the new growth may be completely gray.  Often, new hair is very fine and soft.  Infection Prevention Please wash your hands for at least 30 seconds using warm soapy water . Handwashing is the #1 way to prevent the spread of germs. Stay away from sick people or people who are getting over a cold. If you develop respiratory systems such as green/yellow mucus production or productive cough or persistent cough let us  know and we will see if you need an antibiotic. It is a good idea to keep a pair of gloves on when going into grocery stores/[employer] to decrease your risk of coming into contact with germs on the carts, etc. Carry alcohol  hand gel with you at all times and use it  frequently if out in public. If your temperature reaches 100.5 or higher please call the clinic and let us  know.  If it is after hours or on the weekend please go to the ER if your temperature is over 100.5.  Please have your own personal thermometer at home to use.    Sex and bodily fluids If you are going to have sex, a condom must be used to protect the person that isn't taking chemotherapy. Chemo can decrease your libido (sex drive). For a few days after chemotherapy, chemotherapy can be excreted through your bodily fluids.  When using the toilet please close the lid and flush the toilet twice.  Do this for a few day after you have had chemotherapy.   Effects of chemotherapy on your sex life Some changes are simple and won't last long. They won't affect your sex life permanently.  Sometimes you may feel: too tired not strong enough to be very active sick or sore  not in the mood anxious or low Your anxiety might not seem related to sex. For example, you may be worried about the cancer and how your treatment is going. Or you may be worried about money, or about how you family are coping with your illness.  These things can cause stress, which can affect your interest in sex. It's important to talk to your partner about  how you feel.  Remember - the changes to your sex life don't usually last long. There's usually no medical reason to stop having sex during chemo. The drugs won't have any long term physical effects on your performance or enjoyment of sex. Cancer can't be passed on to your partner during sex  Contraception It's important to use reliable contraception during treatment. Avoid getting pregnant while you or your partner are having chemotherapy. This is because the drugs may harm the baby. Sometimes chemotherapy drugs can leave a man or woman infertile.  This means you would not be able to have children in the future. You might want to talk to someone about permanent infertility. It can  be very difficult to learn that you may no longer be able to have children. Some people find counselling helpful. There might be ways to preserve your fertility, although this is easier for men than for women. You may want to speak to a fertility expert. You can talk about sperm banking or harvesting your eggs. You can also ask about other fertility options, such as donor eggs. If you have or have had breast cancer, your doctor might advise you not to take the contraceptive pill. This is because the hormones in it might affect the cancer. It is not known for sure whether or not chemotherapy drugs can be passed on through semen or secretions from the vagina. Because of this some doctors advise people to use a barrier method if you have sex during treatment. This applies to vaginal, anal or oral sex. Generally, doctors advise a barrier method only for the time you are actually having the treatment and for about a week after your treatment. Advice like this can be worrying, but this does not mean that you have to avoid being intimate with your partner. You can still have close contact with your partner and continue to enjoy sex.  Animals If you have cats or birds we just ask that you not change the litter or change the cage.  Please have someone else do this for you while you are on chemotherapy.   Food Safety During and After Cancer Treatment Food safety is important for people both during and after cancer treatment. Cancer and cancer treatments, such as chemotherapy, radiation therapy, and stem cell/bone marrow transplantation, often weaken the immune system. This makes it harder for your body to protect itself from foodborne illness, also called food poisoning. Foodborne illness is caused by eating food that contains harmful bacteria, parasites, or viruses.  Foods to avoid Some foods have a higher risk of becoming tainted with bacteria. These include: Unwashed fresh fruit and vegetables, especially leafy  vegetables that can hide dirt and other contaminants Raw sprouts, such as alfalfa sprouts Raw or undercooked beef, especially ground beef, or other raw or undercooked meat and poultry Fatty, fried, or spicy foods immediately before or after treatment.  These can sit heavy on your stomach and make you feel nauseous. Raw or undercooked shellfish, such as oysters. Sushi and sashimi, which often contain raw fish.  Unpasteurized beverages, such as unpasteurized fruit juices, raw milk, raw yogurt, or cider Undercooked eggs, such as soft boiled, over easy, and poached; raw, unpasteurized eggs; or foods made with raw egg, such as homemade raw cookie dough and homemade mayonnaise  Simple steps for food safety  Shop smart. Do not buy food stored or displayed in an unclean area. Do not buy bruised or damaged fruits or vegetables. Do not buy cans that have  cracks, dents, or bulges. Pick up foods that can spoil at the end of your shopping trip and store them in a cooler on the way home.  Prepare and clean up foods carefully. Rinse all fresh fruits and vegetables under running water , and dry them with a clean towel or paper towel. Clean the top of cans before opening them. After preparing food, wash your hands for 20 seconds with hot water  and soap. Pay special attention to areas between fingers and under nails. Clean your utensils and dishes with hot water  and soap. Disinfect your kitchen and cutting boards using 1 teaspoon of liquid, unscented bleach mixed into 1 quart of water .    Dispose of old food. Eat canned and packaged food before its expiration date (the "use by" or "best before" date). Consume refrigerated leftovers within 3 to 4 days. After that time, throw out the food. Even if the food does not smell or look spoiled, it still may be unsafe. Some bacteria, such as Listeria, can grow even on foods stored in the refrigerator if they are kept for too long.  Take precautions when eating  out. At restaurants, avoid buffets and salad bars where food sits out for a long time and comes in contact with many people. Food can become contaminated when someone with a virus, often a norovirus, or another "bug" handles it. Put any leftover food in a "to-go" container yourself, rather than having the server do it. And, refrigerate leftovers as soon as you get home. Choose restaurants that are clean and that are willing to prepare your food as you order it cooked.    When to call the doctor:   SYMPTOMS TO REPORT AS SOON AS POSSIBLE AFTER TREATMENT:  FEVER GREATER THAN 100.5 F  CHILLS WITH OR WITHOUT FEVER  NAUSEA AND VOMITING THAT IS NOT CONTROLLED WITH YOUR NAUSEA MEDICATION  UNUSUAL SHORTNESS OF BREATH  UNUSUAL BRUISING OR BLEEDING  TENDERNESS IN MOUTH AND THROAT WITH OR WITHOUT   PRESENCE OF ULCERS  URINARY PROBLEMS  BOWEL PROBLEMS  UNUSUAL RASH

## 2023-08-23 NOTE — Telephone Encounter (Signed)
 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 Planned start 08/30/23.   CMP from 07/07/23 assessed, no relevant lab abnormalities. Prescription dose and frequency assessed. MD starting at a reduced dose and plans to increase dose if tolerated.   Current medication list in Epic reviewed, a few DDIs with encorafenib identified: Alprazolam : encorafenib may decrease the serum concentration of alprazolam . Monitor for reduced alprazolam  efficacy. No baseline dose adjustment needed.  Rosuvastatin: encorafenib may increase the serum concentration of rosuvastatin. Monitor for increased rosuvastatin toxicities (eg, myalgias, rhabdomyolysis). No baseline dose adjustment needed.  Evaluated chart and no patient barriers to medication adherence identified.   Prescription has been e-scribed to the [facility] for benefits analysis and approval.  Oral Oncology Clinic will continue to follow for insurance authorization, copayment issues, initial counseling and start date.   Patricia Hurst N. Patricia Hurst, PharmD, BCOP, CPP Hematology/Oncology Clinical Pharmacist [facility] Oral Chemotherapy Navigation Clinic [phone]  08/23/2023 10:46 AM

## 2023-08-23 NOTE — Telephone Encounter (Signed)
 Oral Oncology Patient Advocate Encounter   Received notification that prior authorization for Braftovi is required.   PA submitted on 08/23/2023 Key [id number] Status is pending     Patty Benjaman Branch, CPhT Oncology Pharmacy Patient Advocate [facility] Direct Number: [phone] Fax: [phone]

## 2023-08-23 NOTE — Telephone Encounter (Signed)
 Oral Oncology Patient Advocate Encounter  Prior Authorization for Wendee Halon has been approved.    PA# [id number] Effective dates: 08/23/2023 through 02/19/2024  Patients co-pay is $4.80.    Patty Benjaman Branch, CPhT Oncology Pharmacy Patient Advocate [facility] Direct Number: [phone] Fax: [phone]

## 2023-08-23 NOTE — Progress Notes (Signed)
 DISCONTINUE ON PATHWAY REGIMEN - Colorectal     A cycle is every 14 days:     Oxaliplatin       Leucovorin       Fluorouracil       Fluorouracil    **Always confirm dose/schedule in your pharmacy ordering system**  PRIOR TREATMENT: COS83: mFOLFOX6 q14 Days x 3 Months  START ON PATHWAY REGIMEN - Colorectal     A cycle is every 14 days:     Encorafenib      Cetuximab      Oxaliplatin       Leucovorin       Fluorouracil       Fluorouracil    **Always confirm dose/schedule in your pharmacy ordering system**  Patient Characteristics: Distant Metastases, Nonsurgical Candidate, BRAF V600 Mutation Positive (KRAS/NRAS Wild-Type), Standard Cytotoxic/Targeted Therapy, First Line Standard Cytotoxic/Targeted Therapy Tumor Location: Colon Therapeutic Status: Distant Metastases Microsatellite/Mismatch Repair Status: MSS/pMMR BRAF Mutation Status: Mutation Positive KRAS/NRAS Mutation Status: Wild-Type (no mutation) Standard Cytotoxic/Targeted Line of Therapy: First Line Standard Cytotoxic/Targeted Therapy Intent of Therapy: Non-Curative / Palliative Intent, Discussed with Patient

## 2023-08-24 ENCOUNTER — Other Ambulatory Visit: Payer: Self-pay

## 2023-08-24 ENCOUNTER — Inpatient Hospital Stay: Admitting: Licensed Clinical Social Worker

## 2023-08-24 ENCOUNTER — Other Ambulatory Visit: Payer: Self-pay | Admitting: Pharmacy Technician

## 2023-08-24 DIAGNOSIS — C189 Malignant neoplasm of colon, unspecified: Secondary | ICD-10-CM

## 2023-08-24 NOTE — Progress Notes (Signed)
 [facility] Clinical Social Work  Initial Assessment   Patricia Hurst is a 78 y.o. year old female contacted by phone. Clinical Social Work was referred by medical provider for assessment of psychosocial needs.   SDOH (Social Determinants of Health) assessments performed: Yes   SDOH Screenings   Food Insecurity: No Food Insecurity (06/26/2019)  Transportation Needs: No Transportation Needs (06/26/2019)  Financial Resource Strain: Medium Risk (06/26/2019)  Physical Activity: Insufficiently Active (06/26/2019)  Social Connections: Moderately Integrated (06/26/2019)  Stress: No Stress Concern Present (06/26/2019)  Tobacco Use: Low Risk  (04/18/2023)     Distress Screen completed: No     No data to display            Family/Social Information:  Housing Arrangement: patient lives with her husband.  Pt states when she was originally diagnosed w/ cancer in 2021 her husband underwent a double transplant of his liver and kidney.  Pt's spouse recovered well and he has not had issues since. Family members/support persons in your life? Pt has 3 adult children who reside close by and are able to provide support as needed.   Transportation concerns: no  Employment: Retired .  Income source: Actor concerns: No Type of concern: None Food access concerns: no Religious or spiritual practice: Yes-Pent[employer] Advanced directives: Not known Services Currently in place:  none  Coping/ Adjustment to diagnosis: Patient understands treatment plan and what happens next? yes Concerns about diagnosis and/or treatment: Quality of life Patient reported stressors: Adjusting to my illness Hopes and/or priorities: pt's priority is to start treatment w/ the hope of positive results Patient enjoys time with family/ friends Current coping skills/ strengths: Capable of independent living , Motivation for treatment/growth , Physical Health , Religious Affiliation , and Supportive  family/friends     SUMMARY: Current SDOH Barriers:  No barriers identified at this time.  Clinical Social Work Clinical Goal(s):  No clinical social work goals at this time  Interventions: Discussed common feeling and emotions when being diagnosed with cancer, and the importance of support during treatment Informed patient of the support team roles and support services at [facility] Provided CSW contact information and encouraged patient to call with any questions or concerns Referred patient to [facility] for supportive services   Follow Up Plan: Patient will contact CSW with any support or resource needs Patient verbalizes understanding of plan: Yes    Quenton Bruns, LCSW Clinical Social Worker [facility]

## 2023-08-24 NOTE — Progress Notes (Signed)
 Specialty Pharmacy Initial Fill Coordination Note  Patricia Hurst is a 78 y.o. female contacted today regarding refills of specialty medication(s) Encorafenib (BRAFTOVI) .  Patient requested Delivery  on 08/26/23  to verified address [address] [place] [address]   Medication will be filled on 08/25/2023.   Patient is aware of $4.80 copayment.   Patty Benjaman Branch, CPhT Oncology Pharmacy Patient Advocate [facility] Direct Number: [phone] Fax: [phone]

## 2023-08-24 NOTE — Progress Notes (Signed)
 Patient education documented in EPIC note on 08/24/23.

## 2023-08-24 NOTE — Telephone Encounter (Signed)
 Clinical Pharmacist Practitioner Encounter   [facility] (Specialty) to deliver medication to patient on 08/26/23. She knows not to start until 08/30/23.   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 Planned start 08/30/23.   Counseled patient on administration, dosing, side effects, monitoring, drug-food interactions, safe handling, storage, and disposal. Patient will take 3 capsules (225 mg total) by mouth daily.   Side effects include but not limited to: diarrhea, acne-like rash, nausea, fatigue, decreased hgb. Diarrhea: patient knows to use loperamide as needed and call the office if they are having four or more loose stool per day Acne-like rash: patient knows to report a rash if she notices a rash   Reviewed with patient importance of keeping a medication schedule and plan for any missed doses.  After discussion with patient no patient barriers to medication adherence identified.   Ms. Askey voiced understanding and appreciation. All questions answered. Medication handout provided.  Provided patient with Oral Chemotherapy Navigation Clinic phone number. Patient knows to call the office with questions or concerns. Oral Chemotherapy Navigation Clinic will continue to follow.  Lafaye Mcelmurry N. Preslynn Bier, PharmD, BCOP, CPP Hematology/Oncology Clinical Pharmacist [facility] Oral Chemotherapy Navigation Clinic [phone]  08/24/2023 10:10 AM

## 2023-08-25 ENCOUNTER — Other Ambulatory Visit: Payer: Self-pay

## 2023-08-25 ENCOUNTER — Inpatient Hospital Stay: Attending: Hematology

## 2023-08-25 ENCOUNTER — Inpatient Hospital Stay

## 2023-08-25 DIAGNOSIS — F419 Anxiety disorder, unspecified: Secondary | ICD-10-CM | POA: Diagnosis not present

## 2023-08-25 DIAGNOSIS — Z95828 Presence of other vascular implants and grafts: Secondary | ICD-10-CM

## 2023-08-25 DIAGNOSIS — M546 Pain in thoracic spine: Secondary | ICD-10-CM | POA: Insufficient documentation

## 2023-08-25 DIAGNOSIS — C182 Malignant neoplasm of ascending colon: Secondary | ICD-10-CM

## 2023-08-25 DIAGNOSIS — R7989 Other specified abnormal findings of blood chemistry: Secondary | ICD-10-CM | POA: Insufficient documentation

## 2023-08-25 DIAGNOSIS — C787 Secondary malignant neoplasm of liver and intrahepatic bile duct: Secondary | ICD-10-CM | POA: Insufficient documentation

## 2023-08-25 DIAGNOSIS — Z452 Encounter for adjustment and management of vascular access device: Secondary | ICD-10-CM | POA: Insufficient documentation

## 2023-08-25 DIAGNOSIS — Z79899 Other long term (current) drug therapy: Secondary | ICD-10-CM | POA: Insufficient documentation

## 2023-08-25 DIAGNOSIS — E878 Other disorders of electrolyte and fluid balance, not elsewhere classified: Secondary | ICD-10-CM | POA: Insufficient documentation

## 2023-08-25 DIAGNOSIS — Z5112 Encounter for antineoplastic immunotherapy: Secondary | ICD-10-CM | POA: Insufficient documentation

## 2023-08-25 DIAGNOSIS — Z5111 Encounter for antineoplastic chemotherapy: Secondary | ICD-10-CM | POA: Insufficient documentation

## 2023-08-25 DIAGNOSIS — C183 Malignant neoplasm of hepatic flexure: Secondary | ICD-10-CM | POA: Insufficient documentation

## 2023-08-25 LAB — COMPREHENSIVE METABOLIC PANEL WITH GFR
ALT: 14 U/L (ref 0–44)
AST: 23 U/L (ref 15–41)
Albumin: 4.2 g/dL (ref 3.5–5.0)
Alkaline Phosphatase: 41 U/L (ref 38–126)
Anion gap: 9 (ref 5–15)
BUN: 13 mg/dL (ref 8–23)
CO2: 26 mmol/L (ref 22–32)
Calcium: 9.8 mg/dL (ref 8.9–10.3)
Chloride: 102 mmol/L (ref 98–111)
Creatinine, Ser: 0.97 mg/dL (ref 0.44–1.00)
GFR, Estimated: >60 mL/min (ref >60)
Glucose, Bld: 95 mg/dL (ref 70–99)
Potassium: 4.4 mmol/L (ref 3.5–5.1)
Sodium: 137 mmol/L (ref 135–145)
Total Bilirubin: 1.5 mg/dL — ABNORMAL HIGH (ref 0.0–1.2)
Total Protein: 7.2 g/dL (ref 6.5–8.1)

## 2023-08-25 LAB — CBC WITH DIFFERENTIAL/PLATELET
Abs Immature Granulocytes: 0.02 10*3/uL (ref 0.00–0.07)
Basophils Absolute: 0.1 10*3/uL (ref 0.0–0.1)
Basophils Relative: 1 %
Eosinophils Absolute: 0.3 10*3/uL (ref 0.0–0.5)
Eosinophils Relative: 4 %
HCT: 36.2 % (ref 36.0–46.0)
Hemoglobin: 12.4 g/dL (ref 12.0–15.0)
Immature Granulocytes: 0 %
Lymphocytes Relative: 28 %
Lymphs Abs: 1.7 10*3/uL (ref 0.7–4.0)
MCH: 31.3 pg (ref 26.0–34.0)
MCHC: 34.3 g/dL (ref 30.0–36.0)
MCV: 91.4 fL (ref 80.0–100.0)
Monocytes Absolute: 0.6 10*3/uL (ref 0.1–1.0)
Monocytes Relative: 10 %
Neutro Abs: 3.5 10*3/uL (ref 1.7–7.7)
Neutrophils Relative %: 57 %
Platelets: 177 10*3/uL (ref 150–400)
RBC: 3.96 MIL/uL (ref 3.87–5.11)
RDW: 11.6 % (ref 11.5–15.5)
WBC: 6.1 10*3/uL (ref 4.0–10.5)
nRBC: 0 % (ref 0.0–0.2)

## 2023-08-25 LAB — MAGNESIUM: Magnesium: 2 mg/dL (ref 1.7–2.4)

## 2023-08-25 NOTE — Progress Notes (Signed)

## 2023-08-26 ENCOUNTER — Ambulatory Visit (HOSPITAL_COMMUNITY): Admission: RE | Admit: 2023-08-26 | Source: Ambulatory Visit

## 2023-08-26 ENCOUNTER — Inpatient Hospital Stay

## 2023-08-26 ENCOUNTER — Ambulatory Visit (HOSPITAL_COMMUNITY)
Admission: RE | Admit: 2023-08-26 | Discharge: 2023-08-26 | Disposition: A | Discharge status: Home / Self Care | Source: Ambulatory Visit | Attending: Hematology | Admitting: Hematology

## 2023-08-26 DIAGNOSIS — C787 Secondary malignant neoplasm of liver and intrahepatic bile duct: Secondary | ICD-10-CM | POA: Diagnosis not present

## 2023-08-26 DIAGNOSIS — R7989 Other specified abnormal findings of blood chemistry: Secondary | ICD-10-CM | POA: Diagnosis not present

## 2023-08-26 DIAGNOSIS — C183 Malignant neoplasm of hepatic flexure: Secondary | ICD-10-CM | POA: Diagnosis not present

## 2023-08-26 DIAGNOSIS — F419 Anxiety disorder, unspecified: Secondary | ICD-10-CM | POA: Diagnosis not present

## 2023-08-26 DIAGNOSIS — Z95828 Presence of other vascular implants and grafts: Secondary | ICD-10-CM | POA: Insufficient documentation

## 2023-08-26 DIAGNOSIS — M546 Pain in thoracic spine: Secondary | ICD-10-CM | POA: Diagnosis not present

## 2023-08-26 DIAGNOSIS — Z5111 Encounter for antineoplastic chemotherapy: Secondary | ICD-10-CM | POA: Diagnosis not present

## 2023-08-26 DIAGNOSIS — E878 Other disorders of electrolyte and fluid balance, not elsewhere classified: Secondary | ICD-10-CM | POA: Diagnosis not present

## 2023-08-26 DIAGNOSIS — Z5112 Encounter for antineoplastic immunotherapy: Secondary | ICD-10-CM | POA: Diagnosis not present

## 2023-08-26 DIAGNOSIS — T82528A Displacement of other cardiac and vascular devices and implants, initial encounter: Secondary | ICD-10-CM | POA: Diagnosis not present

## 2023-08-26 LAB — CEA: CEA: 2.1 ng/mL (ref 0.0–4.7)

## 2023-08-26 MED ORDER — HEPARIN SOD (PORK) LOCK FLUSH 100 UNIT/ML IV SOLN
500.0000 [IU] | Freq: Once | INTRAVENOUS | Status: AC
  Administered 2023-08-26: 500 [IU] via INTRAVENOUS

## 2023-08-26 MED ORDER — HEPARIN SOD (PORK) LOCK FLUSH 100 UNIT/ML IV SOLN: 500.0000 [IU] | Freq: Once | INTRAVENOUS | Status: DC

## 2023-08-26 MED ORDER — SODIUM CHLORIDE 0.9% FLUSH
10.0000 mL | INTRAVENOUS | Status: DC | PRN
  Administered 2023-08-26: 10 mL via INTRAVENOUS

## 2023-08-26 MED ORDER — HEPARIN SOD (PORK) LOCK FLUSH 100 UNIT/ML IV SOLN
INTRAVENOUS | Status: AC
  Filled 2023-08-26: qty 5

## 2023-08-26 MED ORDER — IOHEXOL 180 MG/ML  SOLN
20.0000 mL | Freq: Once | INTRAMUSCULAR | Status: AC
  Administered 2023-08-26: 20 mL via INTRAVENOUS

## 2023-08-26 NOTE — Progress Notes (Signed)
 Patricia Hurst presented for Portacath access and flush. Proper placement of portacath confirmed by CXR. Portacath located right chest wall accessed with  H 20 needle. No blood return, no resistance met.  Portacath flushed with 20ml NS-sent pt to xray for dye study.  Procedure without incident. Patient tolerated procedure well.

## 2023-08-29 NOTE — Progress Notes (Unsigned)
 Dye study report of port a cath reviewed with Dr.Katragadda this today, Per MD ok to use for treatment today. Will reach out to Dr. Collene Dawson for further evaluation per Dr. Cheree Cords.

## 2023-08-30 ENCOUNTER — Inpatient Hospital Stay

## 2023-08-30 VITALS — BP 144/80 | HR 75 | Temp 97.5°F | Resp 18 | Wt 131.6 lb

## 2023-08-30 DIAGNOSIS — C182 Malignant neoplasm of ascending colon: Secondary | ICD-10-CM

## 2023-08-30 DIAGNOSIS — R7989 Other specified abnormal findings of blood chemistry: Secondary | ICD-10-CM | POA: Diagnosis not present

## 2023-08-30 DIAGNOSIS — C189 Malignant neoplasm of colon, unspecified: Secondary | ICD-10-CM | POA: Diagnosis not present

## 2023-08-30 DIAGNOSIS — Z5111 Encounter for antineoplastic chemotherapy: Secondary | ICD-10-CM | POA: Diagnosis not present

## 2023-08-30 DIAGNOSIS — C183 Malignant neoplasm of hepatic flexure: Secondary | ICD-10-CM | POA: Diagnosis not present

## 2023-08-30 DIAGNOSIS — M546 Pain in thoracic spine: Secondary | ICD-10-CM | POA: Diagnosis not present

## 2023-08-30 DIAGNOSIS — F419 Anxiety disorder, unspecified: Secondary | ICD-10-CM | POA: Diagnosis not present

## 2023-08-30 DIAGNOSIS — Z5112 Encounter for antineoplastic immunotherapy: Secondary | ICD-10-CM | POA: Diagnosis not present

## 2023-08-30 DIAGNOSIS — C787 Secondary malignant neoplasm of liver and intrahepatic bile duct: Secondary | ICD-10-CM | POA: Diagnosis not present

## 2023-08-30 DIAGNOSIS — Z95828 Presence of other vascular implants and grafts: Secondary | ICD-10-CM

## 2023-08-30 DIAGNOSIS — E878 Other disorders of electrolyte and fluid balance, not elsewhere classified: Secondary | ICD-10-CM | POA: Diagnosis not present

## 2023-08-30 MED ORDER — DEXAMETHASONE SODIUM PHOSPHATE 10 MG/ML IJ SOLN
10.0000 mg | Freq: Once | INTRAMUSCULAR | Status: AC
  Administered 2023-08-30: 10 mg via INTRAVENOUS
  Filled 2023-08-30: qty 1

## 2023-08-30 MED ORDER — FLUOROURACIL CHEMO INJECTION 500 MG/10ML
240.0000 mg/m2 | Freq: Once | INTRAVENOUS | Status: AC
  Administered 2023-08-30: 400 mg via INTRAVENOUS
  Filled 2023-08-30: qty 8

## 2023-08-30 MED ORDER — PALONOSETRON HCL INJECTION 0.25 MG/5ML
0.2500 mg | Freq: Once | INTRAVENOUS | Status: AC
  Administered 2023-08-30: 0.25 mg via INTRAVENOUS
  Filled 2023-08-30: qty 5

## 2023-08-30 MED ORDER — DEXTROSE 5 % IV SOLN: INTRAVENOUS | Status: DC

## 2023-08-30 MED ORDER — OXALIPLATIN CHEMO INJECTION 100 MG/20ML
51.0000 mg/m2 | Freq: Once | INTRAVENOUS | Status: AC
  Administered 2023-08-30: 85 mg via INTRAVENOUS
  Filled 2023-08-30: qty 17

## 2023-08-30 MED ORDER — CETUXIMAB CHEMO IV INJECTION 200 MG/100ML
400.0000 mg/m2 | Freq: Once | INTRAVENOUS | Status: AC
Start: 2023-08-30 — End: 2023-08-30
  Administered 2023-08-30: 600 mg via INTRAVENOUS
  Filled 2023-08-30: qty 300

## 2023-08-30 MED ORDER — DIPHENHYDRAMINE HCL 50 MG/ML IJ SOLN
25.0000 mg | Freq: Once | INTRAMUSCULAR | Status: AC
  Administered 2023-08-30: 25 mg via INTRAVENOUS
  Filled 2023-08-30: qty 1

## 2023-08-30 MED ORDER — DEXTROSE 5 % IV SOLN
240.0000 mg/m2 | Freq: Once | INTRAVENOUS | Status: AC
  Administered 2023-08-30: 388 mg via INTRAVENOUS
  Filled 2023-08-30: qty 19.4

## 2023-08-30 MED ORDER — SODIUM CHLORIDE 0.9% FLUSH: 10.0000 mL | INTRAVENOUS | Status: DC | PRN

## 2023-08-30 MED ORDER — SODIUM CHLORIDE 0.9 % IV SOLN
1440.0000 mg/m2 | INTRAVENOUS | Status: DC
  Administered 2023-08-30: 2500 mg via INTRAVENOUS
  Filled 2023-08-30: qty 50

## 2023-08-30 MED ORDER — FAMOTIDINE IN NACL 20-0.9 MG/50ML-% IV SOLN
20.0000 mg | Freq: Once | INTRAVENOUS | Status: AC
Start: 2023-08-30 — End: 2023-08-30
  Administered 2023-08-30: 20 mg via INTRAVENOUS
  Filled 2023-08-30: qty 50

## 2023-08-30 MED ORDER — MONTELUKAST SODIUM 10 MG PO TABS
10.0000 mg | ORAL_TABLET | Freq: Once | ORAL | Status: AC
  Administered 2023-08-30: 10 mg via ORAL
  Filled 2023-08-30: qty 1

## 2023-08-30 NOTE — Progress Notes (Signed)
 Tip placement checked and ok to treat verbal order Dr. Cheree Cords.    Patient tolerated chemotherapy with no complaints voiced.  Side effects with management reviewed with understanding verbalized.  Port site clean and dry with no bruising or swelling noted at site.  Good blood return noted before and after administration of chemotherapy.  Chemo pump connected with no alarms noted.  Patient left in satisfactory condition with VSS and no s/s of distress noted.

## 2023-08-30 NOTE — Progress Notes (Signed)
 Pharmacist Chemotherapy Monitoring - Initial Assessment    Anticipated start date: 08/30/23   The following has been reviewed per standard work regarding the patient's treatment regimen: The patient's diagnosis, treatment plan and drug doses, and organ/hematologic function Lab orders and baseline tests specific to treatment regimen  The treatment plan start date, drug sequencing, and pre-medications Prior authorization status  Patient's documented medication list, including drug-drug interaction screen and prescriptions for anti-emetics and supportive care specific to the treatment regimen The drug concentrations, fluid compatibility, administration routes, and timing of the medications to be used The patient's access for treatment and lifetime cumulative dose history, if applicable  The patient's medication allergies and previous infusion related reactions, if applicable   Changes made to treatment plan:  N/A  Follow up needed:  N/A   Artie Laster, [facility] 08/30/2023  8:15 AM

## 2023-08-30 NOTE — Patient Instructions (Signed)
 [facility] [place] - A DEPT OF [facility]  Discharge Instructions: Thank you for choosing [facility] to provide your oncology and hematology care.  If you have a lab appointment with the Cancer Center - please note that after April 8th, 2024, all labs will be drawn in the cancer center.  You do not have to check in or register with the main entrance as you have in the past but will complete your check-in in the cancer center.  Wear comfortable clothing and clothing appropriate for easy access to any Portacath or PICC line.   We strive to give you quality time with your provider. You may need to reschedule your appointment if you arrive late (15 or more minutes).  Arriving late affects you and other patients whose appointments are after yours.  Also, if you miss three or more appointments without notifying the office, you may be dismissed from the clinic at the provider's discretion.      For prescription refill requests, have your pharmacy contact our office and allow 72 hours for refills to be completed.    Today you received the following chemotherapy and/or immunotherapy agents Oxaliplatin , erbitux, adruicil.       To help prevent nausea and vomiting after your treatment, we encourage you to take your nausea medication as directed.  BELOW ARE SYMPTOMS THAT SHOULD BE REPORTED IMMEDIATELY: *FEVER GREATER THAN 100.4 F (38 C) OR HIGHER *CHILLS OR SWEATING *NAUSEA AND VOMITING THAT IS NOT CONTROLLED WITH YOUR NAUSEA MEDICATION *UNUSUAL SHORTNESS OF BREATH *UNUSUAL BRUISING OR BLEEDING *URINARY PROBLEMS (pain or burning when urinating, or frequent urination) *BOWEL PROBLEMS (unusual diarrhea, constipation, pain near the anus) TENDERNESS IN MOUTH AND THROAT WITH OR WITHOUT PRESENCE OF ULCERS (sore throat, sores in mouth, or a toothache) UNUSUAL RASH, SWELLING OR PAIN  UNUSUAL VAGINAL DISCHARGE OR ITCHING   Items with * indicate a potential emergency  and should be followed up as soon as possible or go to the Emergency Department if any problems should occur.  Please show the CHEMOTHERAPY ALERT CARD or IMMUNOTHERAPY ALERT CARD at check-in to the Emergency Department and triage nurse.  Should you have questions after your visit or need to cancel or reschedule your appointment, please contact [facility]  - A DEPT OF [facility] [phone]  and follow the prompts.  Office hours are 8:00 a.m. to 4:30 p.m. Monday - Friday. Please note that voicemails left after 4:00 p.m. may not be returned until the following business day.  We are closed weekends and major holidays. You have access to a nurse at all times for urgent questions. Please call the main number to the clinic [phone] and follow the prompts.  For any non-urgent questions, you may also contact your provider using MyChart. We now offer e-Visits for anyone 33 and older to request care online for non-urgent symptoms. For details visit [email or web address].   Also download the MyChart app! Go to the app store, search "MyChart", open the app, select [place], and log in with your MyChart username and password.

## 2023-08-31 ENCOUNTER — Telehealth: Payer: Self-pay | Admitting: *Deleted

## 2023-08-31 NOTE — Telephone Encounter (Signed)
 Patient advised that she woke up with mild redness across nose and cheeks.  She denies any other associated symptoms and has been using Aquaphor.  Will assess when she returns for pump d/c.  Patient will call us  if anything additional develops.

## 2023-09-01 ENCOUNTER — Inpatient Hospital Stay

## 2023-09-01 VITALS — BP 123/50 | HR 85 | Temp 98.4°F | Resp 18

## 2023-09-01 DIAGNOSIS — M546 Pain in thoracic spine: Secondary | ICD-10-CM | POA: Diagnosis not present

## 2023-09-01 DIAGNOSIS — E878 Other disorders of electrolyte and fluid balance, not elsewhere classified: Secondary | ICD-10-CM | POA: Diagnosis not present

## 2023-09-01 DIAGNOSIS — Z5112 Encounter for antineoplastic immunotherapy: Secondary | ICD-10-CM | POA: Diagnosis not present

## 2023-09-01 DIAGNOSIS — C787 Secondary malignant neoplasm of liver and intrahepatic bile duct: Secondary | ICD-10-CM | POA: Diagnosis not present

## 2023-09-01 DIAGNOSIS — R7989 Other specified abnormal findings of blood chemistry: Secondary | ICD-10-CM | POA: Diagnosis not present

## 2023-09-01 DIAGNOSIS — Z95828 Presence of other vascular implants and grafts: Secondary | ICD-10-CM

## 2023-09-01 DIAGNOSIS — F419 Anxiety disorder, unspecified: Secondary | ICD-10-CM | POA: Diagnosis not present

## 2023-09-01 DIAGNOSIS — Z5111 Encounter for antineoplastic chemotherapy: Secondary | ICD-10-CM | POA: Diagnosis not present

## 2023-09-01 DIAGNOSIS — C182 Malignant neoplasm of ascending colon: Secondary | ICD-10-CM

## 2023-09-01 DIAGNOSIS — C183 Malignant neoplasm of hepatic flexure: Secondary | ICD-10-CM | POA: Diagnosis not present

## 2023-09-01 MED ORDER — HEPARIN SOD (PORK) LOCK FLUSH 100 UNIT/ML IV SOLN
500.0000 [IU] | Freq: Once | INTRAVENOUS | Status: AC | PRN
  Administered 2023-09-01: 500 [IU]

## 2023-09-01 MED ORDER — SODIUM CHLORIDE 0.9% FLUSH
10.0000 mL | INTRAVENOUS | Status: DC | PRN
  Administered 2023-09-01: 10 mL

## 2023-09-01 NOTE — Progress Notes (Signed)
 Patient presents today for 5FU chemotherapy pump disconnection per provider's order. Vital signs stable and patient voiced no new complaints at this time.   Port flushed easily with 10 mL of normal saline and 5 mL of heparin . No blood return noted and needle removed intact. No bruising or swelling noted at the site.  Discharged from clinic ambulatory in stable condition. Alert and oriented x 3. F/U with [facility] as scheduled.

## 2023-09-01 NOTE — Patient Instructions (Signed)
 [facility] [place] - A DEPT OF [facility]  Discharge Instructions: Thank you for choosing [facility] to provide your oncology and hematology care.  If you have a lab appointment with the Cancer Center - please note that after April 8th, 2024, all labs will be drawn in the cancer center.  You do not have to check in or register with the main entrance as you have in the past but will complete your check-in in the cancer center.  Wear comfortable clothing and clothing appropriate for easy access to any Portacath or PICC line.   We strive to give you quality time with your provider. You may need to reschedule your appointment if you arrive late (15 or more minutes).  Arriving late affects you and other patients whose appointments are after yours.  Also, if you miss three or more appointments without notifying the office, you may be dismissed from the clinic at the provider's discretion.      For prescription refill requests, have your pharmacy contact our office and allow 72 hours for refills to be completed.    Today you received the following chemotherapy and/or immunotherapy agent pump disconnection   To help prevent nausea and vomiting after your treatment, we encourage you to take your nausea medication as directed.  BELOW ARE SYMPTOMS THAT SHOULD BE REPORTED IMMEDIATELY: *FEVER GREATER THAN 100.4 F (38 C) OR HIGHER *CHILLS OR SWEATING *NAUSEA AND VOMITING THAT IS NOT CONTROLLED WITH YOUR NAUSEA MEDICATION *UNUSUAL SHORTNESS OF BREATH *UNUSUAL BRUISING OR BLEEDING *URINARY PROBLEMS (pain or burning when urinating, or frequent urination) *BOWEL PROBLEMS (unusual diarrhea, constipation, pain near the anus) TENDERNESS IN MOUTH AND THROAT WITH OR WITHOUT PRESENCE OF ULCERS (sore throat, sores in mouth, or a toothache) UNUSUAL RASH, SWELLING OR PAIN  UNUSUAL VAGINAL DISCHARGE OR ITCHING   Items with * indicate a potential emergency and should be  followed up as soon as possible or go to the Emergency Department if any problems should occur.  Please show the CHEMOTHERAPY ALERT CARD or IMMUNOTHERAPY ALERT CARD at check-in to the Emergency Department and triage nurse.  Should you have questions after your visit or need to cancel or reschedule your appointment, please contact [facility] [place] - A DEPT OF [facility] [phone]  and follow the prompts.  Office hours are 8:00 a.m. to 4:30 p.m. Monday - Friday. Please note that voicemails left after 4:00 p.m. may not be returned until the following business day.  We are closed weekends and major holidays. You have access to a nurse at all times for urgent questions. Please call the main number to the clinic [phone] and follow the prompts.  For any non-urgent questions, you may also contact your provider using MyChart. We now offer e-Visits for anyone 27 and older to request care online for non-urgent symptoms. For details visit [email or web address].   Also download the MyChart app! Go to the app store, search "MyChart", open the app, select [place], and log in with your MyChart username and password.

## 2023-09-02 ENCOUNTER — Telehealth: Payer: Self-pay

## 2023-09-02 NOTE — Telephone Encounter (Signed)
 Patient called today for 24-hour follow-up. Patient reports she feels weak and tired. Reports diarrhea last night, she did take imodium and diarrhea has gotten better. Patient reports decreased appetite although she has been able to eat some and did take nausea medication this morning. Patient encouraged to rest and drink plenty of fluids and advised to call service line over the weekend if symptoms get worse. Patient verbalized understanding.

## 2023-09-05 ENCOUNTER — Inpatient Hospital Stay: Admitting: Dietician

## 2023-09-05 ENCOUNTER — Telehealth: Payer: Self-pay | Admitting: Dietician

## 2023-09-05 NOTE — Telephone Encounter (Signed)
 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 She is currently receiving reduced dose Folfox/Erbitux  + oral Braftovi  q14d. Patient is under the care of Dr. Cheree Cords.   Past medical history includes GERD, peptic ulcer, herpes zoster, myelpathy, DDD, osteoarthritis, anxiety, HLD, glaucoma  Spoke with patient via telephone. She is doing okay today. States she felt really good the previous 2 days. Patient endorses  3 days of poor appetite, upset stomach, fatigue following therapy. Patient is  having dysphagia with erbitux . The capsule is big, but she finally got this down. Patient eating 3 small meals and snacks. Recalls toast with bacon for breakfast. Had pack of nabs for lunch which is typical for her. Pt is  drinking Gatorade, water , and had a few sips of Coke this morning to settle her stomach. Dietra Fraction has bought Ensure, but she is nervous to try this. Her stomach has always been finicky.    Nutrition Focused Physical Exam:  unable to complete (telephone visit)   Medications: xanax , calcium  500 + D3, synthroid , compazine , MVI, crestor, metamucil    Labs: 5/1 - total bilirubin 1.5   Anthropometrics:   Height: 5'3" Weight: 131 lb 9.6 oz  UBW: 130-135 lb  BMI: 23.31    NUTRITION DIAGNOSIS: Food and nutrition related knowledge deficit related to cancer and associated treatment as evidenced by no prior need for associated nutrition information    INTERVENTION:  Educated on small frequent meals with adequate calories and protein to maintain strength, weights during treatment Discussed cold sensitivity - pt unaware she could try cold after ~4 days Encourage daily Ensure Plus/equivalent - suggested trying half and increase as tolerated  Will mail handouts + contact information    MONITORING, EVALUATION, GOAL: wt trends, intake    Next Visit: Monday June 9 via telephone

## 2023-09-06 ENCOUNTER — Other Ambulatory Visit: Payer: Self-pay

## 2023-09-07 ENCOUNTER — Telehealth: Payer: Self-pay | Admitting: *Deleted

## 2023-09-07 ENCOUNTER — Other Ambulatory Visit: Payer: Self-pay | Admitting: Hematology

## 2023-09-07 MED ORDER — TRAMADOL HCL 50 MG PO TABS: 50.0000 mg | ORAL_TABLET | Freq: Four times a day (QID) | ORAL | 0 refills | Status: DC | PRN

## 2023-09-07 NOTE — Telephone Encounter (Signed)
 Called c/o severe nerve pain in left shoulder blade. Has history of this, however it has gotten much worse. Per Dr. Katragadda, Tramadol was sent to her pharmacy.  She has a plethora of complaints, to include nerve pain in shoulder, not eating well due to pain, periodic rectal bleeding, and mouth ulcers.  Added to Charlton Cooler, NP schedule tomorrow for sx management.

## 2023-09-08 ENCOUNTER — Encounter: Payer: Self-pay | Admitting: Oncology

## 2023-09-08 ENCOUNTER — Inpatient Hospital Stay

## 2023-09-08 ENCOUNTER — Inpatient Hospital Stay (HOSPITAL_BASED_OUTPATIENT_CLINIC_OR_DEPARTMENT_OTHER): Admitting: Oncology

## 2023-09-08 VITALS — BP 147/59 | HR 78 | Temp 97.8°F | Resp 18 | Wt 126.6 lb

## 2023-09-08 DIAGNOSIS — C182 Malignant neoplasm of ascending colon: Secondary | ICD-10-CM

## 2023-09-08 DIAGNOSIS — K1379 Other lesions of oral mucosa: Secondary | ICD-10-CM

## 2023-09-08 DIAGNOSIS — E878 Other disorders of electrolyte and fluid balance, not elsewhere classified: Secondary | ICD-10-CM | POA: Diagnosis not present

## 2023-09-08 DIAGNOSIS — C787 Secondary malignant neoplasm of liver and intrahepatic bile duct: Secondary | ICD-10-CM | POA: Diagnosis not present

## 2023-09-08 DIAGNOSIS — Z95828 Presence of other vascular implants and grafts: Secondary | ICD-10-CM

## 2023-09-08 DIAGNOSIS — C183 Malignant neoplasm of hepatic flexure: Secondary | ICD-10-CM | POA: Diagnosis not present

## 2023-09-08 DIAGNOSIS — Z5112 Encounter for antineoplastic immunotherapy: Secondary | ICD-10-CM | POA: Diagnosis not present

## 2023-09-08 DIAGNOSIS — M546 Pain in thoracic spine: Secondary | ICD-10-CM

## 2023-09-08 DIAGNOSIS — Z5111 Encounter for antineoplastic chemotherapy: Secondary | ICD-10-CM | POA: Diagnosis not present

## 2023-09-08 DIAGNOSIS — F419 Anxiety disorder, unspecified: Secondary | ICD-10-CM | POA: Diagnosis not present

## 2023-09-08 DIAGNOSIS — R7989 Other specified abnormal findings of blood chemistry: Secondary | ICD-10-CM | POA: Diagnosis not present

## 2023-09-08 LAB — COMPREHENSIVE METABOLIC PANEL WITH GFR
ALT: 14 U/L (ref 0–44)
AST: 19 U/L (ref 15–41)
Albumin: 3.6 g/dL (ref 3.5–5.0)
Alkaline Phosphatase: 37 U/L — ABNORMAL LOW (ref 38–126)
Anion gap: 9 (ref 5–15)
BUN: 11 mg/dL (ref 8–23)
CO2: 20 mmol/L — ABNORMAL LOW (ref 22–32)
Calcium: 8.6 mg/dL — ABNORMAL LOW (ref 8.9–10.3)
Chloride: 104 mmol/L (ref 98–111)
Creatinine, Ser: 1.13 mg/dL — ABNORMAL HIGH (ref 0.44–1.00)
GFR, Estimated: 50 mL/min — ABNORMAL LOW (ref >60)
Glucose, Bld: 114 mg/dL — ABNORMAL HIGH (ref 70–99)
Potassium: 3.6 mmol/L (ref 3.5–5.1)
Sodium: 133 mmol/L — ABNORMAL LOW (ref 135–145)
Total Bilirubin: 1.4 mg/dL — ABNORMAL HIGH (ref 0.0–1.2)
Total Protein: 6.6 g/dL (ref 6.5–8.1)

## 2023-09-08 LAB — CBC WITH DIFFERENTIAL/PLATELET
Abs Immature Granulocytes: 0.03 10*3/uL (ref 0.00–0.07)
Basophils Absolute: 0 10*3/uL (ref 0.0–0.1)
Basophils Relative: 0 %
Eosinophils Absolute: 0.3 10*3/uL (ref 0.0–0.5)
Eosinophils Relative: 6 %
HCT: 33.7 % — ABNORMAL LOW (ref 36.0–46.0)
Hemoglobin: 12.1 g/dL (ref 12.0–15.0)
Immature Granulocytes: 1 %
Lymphocytes Relative: 26 %
Lymphs Abs: 1.4 10*3/uL (ref 0.7–4.0)
MCH: 31.8 pg (ref 26.0–34.0)
MCHC: 35.9 g/dL (ref 30.0–36.0)
MCV: 88.5 fL (ref 80.0–100.0)
Monocytes Absolute: 0.7 10*3/uL (ref 0.1–1.0)
Monocytes Relative: 12 %
Neutro Abs: 3 10*3/uL (ref 1.7–7.7)
Neutrophils Relative %: 55 %
Platelets: 171 10*3/uL (ref 150–400)
RBC: 3.81 MIL/uL — ABNORMAL LOW (ref 3.87–5.11)
RDW: 11.5 % (ref 11.5–15.5)
WBC: 5.4 10*3/uL (ref 4.0–10.5)
nRBC: 0 % (ref 0.0–0.2)

## 2023-09-08 LAB — MAGNESIUM: Magnesium: 1.6 mg/dL — ABNORMAL LOW (ref 1.7–2.4)

## 2023-09-08 NOTE — Progress Notes (Unsigned)
     [facility] [address], 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  She received capecitabine  oxaliplatin  on 07/19/2022 and tolerated this poorly so this was stopped.  She tried FOLFOX on 08/15/2022 but again she did not tolerate it well.  No additional chemotherapy.  Patient had PET scan on 07/07/2022 with hypermetabolic segment of the liver along with scattered small lung nodules and left hilar lymph nodes she had a liver biopsy on 07/21/2023 which revealed metastatic disease.  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  Given poor tolerance of other chemotherapies she was started on a low-dose of Symtuza Mab biweekly Braftovi  100 mg daily.  Given high risk for cardiac damage she had an echo on 08/16/2023 which revealed an EF of 65 to 70% she will have a repeat in 1 month.  She completed cycle 1 day 1 of about 10 days ago along with Braftovi .  Interval history- ***

## 2023-09-09 ENCOUNTER — Encounter: Payer: Self-pay | Admitting: Hematology

## 2023-09-09 ENCOUNTER — Encounter (HOSPITAL_COMMUNITY): Payer: Self-pay | Admitting: Hematology

## 2023-09-12 NOTE — Progress Notes (Signed)
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  Systemic Diam: 1.80 cm MV A velocity: 83.60 cm/s MV E/A ratio:  0.96 Armida Lander MD Electronically signed by Armida Lander MD Signature Date/Time: 08/16/2023/10:59:03 AM  Final

## 2023-09-13 ENCOUNTER — Inpatient Hospital Stay (HOSPITAL_BASED_OUTPATIENT_CLINIC_OR_DEPARTMENT_OTHER): Admitting: Hematology

## 2023-09-13 ENCOUNTER — Inpatient Hospital Stay

## 2023-09-13 VITALS — BP 134/60 | HR 75 | Temp 98.0°F | Resp 18 | Ht 62.0 in

## 2023-09-13 VITALS — BP 122/55 | HR 72 | Temp 97.6°F | Resp 18 | Wt 127.0 lb

## 2023-09-13 DIAGNOSIS — M546 Pain in thoracic spine: Secondary | ICD-10-CM | POA: Diagnosis not present

## 2023-09-13 DIAGNOSIS — R7989 Other specified abnormal findings of blood chemistry: Secondary | ICD-10-CM | POA: Diagnosis not present

## 2023-09-13 DIAGNOSIS — C182 Malignant neoplasm of ascending colon: Secondary | ICD-10-CM

## 2023-09-13 DIAGNOSIS — Z95828 Presence of other vascular implants and grafts: Secondary | ICD-10-CM | POA: Diagnosis not present

## 2023-09-13 DIAGNOSIS — C787 Secondary malignant neoplasm of liver and intrahepatic bile duct: Secondary | ICD-10-CM | POA: Diagnosis not present

## 2023-09-13 DIAGNOSIS — Z79899 Other long term (current) drug therapy: Secondary | ICD-10-CM

## 2023-09-13 DIAGNOSIS — Z5111 Encounter for antineoplastic chemotherapy: Secondary | ICD-10-CM | POA: Diagnosis not present

## 2023-09-13 DIAGNOSIS — Z5112 Encounter for antineoplastic immunotherapy: Secondary | ICD-10-CM | POA: Diagnosis not present

## 2023-09-13 DIAGNOSIS — C189 Malignant neoplasm of colon, unspecified: Secondary | ICD-10-CM | POA: Diagnosis not present

## 2023-09-13 DIAGNOSIS — E878 Other disorders of electrolyte and fluid balance, not elsewhere classified: Secondary | ICD-10-CM | POA: Diagnosis not present

## 2023-09-13 DIAGNOSIS — C183 Malignant neoplasm of hepatic flexure: Secondary | ICD-10-CM | POA: Diagnosis not present

## 2023-09-13 DIAGNOSIS — F419 Anxiety disorder, unspecified: Secondary | ICD-10-CM | POA: Diagnosis not present

## 2023-09-13 LAB — CBC WITH DIFFERENTIAL/PLATELET
Abs Immature Granulocytes: 0.02 10*3/uL (ref 0.00–0.07)
Basophils Absolute: 0.1 10*3/uL (ref 0.0–0.1)
Basophils Relative: 2 %
Eosinophils Absolute: 0.6 10*3/uL — ABNORMAL HIGH (ref 0.0–0.5)
Eosinophils Relative: 12 %
HCT: 31.1 % — ABNORMAL LOW (ref 36.0–46.0)
Hemoglobin: 11.3 g/dL — ABNORMAL LOW (ref 12.0–15.0)
Immature Granulocytes: 0 %
Lymphocytes Relative: 25 %
Lymphs Abs: 1.2 10*3/uL (ref 0.7–4.0)
MCH: 32.6 pg (ref 26.0–34.0)
MCHC: 36.3 g/dL — ABNORMAL HIGH (ref 30.0–36.0)
MCV: 89.6 fL (ref 80.0–100.0)
Monocytes Absolute: 0.9 10*3/uL (ref 0.1–1.0)
Monocytes Relative: 20 %
Neutro Abs: 2 10*3/uL (ref 1.7–7.7)
Neutrophils Relative %: 41 %
Platelets: 200 10*3/uL (ref 150–400)
RBC: 3.47 MIL/uL — ABNORMAL LOW (ref 3.87–5.11)
RDW: 12.4 % (ref 11.5–15.5)
WBC: 4.8 10*3/uL (ref 4.0–10.5)
nRBC: 0 % (ref 0.0–0.2)

## 2023-09-13 LAB — COMPREHENSIVE METABOLIC PANEL WITH GFR
ALT: 15 U/L (ref 0–44)
AST: 19 U/L (ref 15–41)
Albumin: 3.4 g/dL — ABNORMAL LOW (ref 3.5–5.0)
Alkaline Phosphatase: 37 U/L — ABNORMAL LOW (ref 38–126)
Anion gap: 6 (ref 5–15)
BUN: 9 mg/dL (ref 8–23)
CO2: 25 mmol/L (ref 22–32)
Calcium: 8.5 mg/dL — ABNORMAL LOW (ref 8.9–10.3)
Chloride: 100 mmol/L (ref 98–111)
Creatinine, Ser: 1.03 mg/dL — ABNORMAL HIGH (ref 0.44–1.00)
GFR, Estimated: 56 mL/min — ABNORMAL LOW (ref >60)
Glucose, Bld: 99 mg/dL (ref 70–99)
Potassium: 3.7 mmol/L (ref 3.5–5.1)
Sodium: 131 mmol/L — ABNORMAL LOW (ref 135–145)
Total Bilirubin: 1.1 mg/dL (ref 0.0–1.2)
Total Protein: 6.1 g/dL — ABNORMAL LOW (ref 6.5–8.1)

## 2023-09-13 LAB — MAGNESIUM: Magnesium: 2 mg/dL (ref 1.7–2.4)

## 2023-09-13 MED ORDER — SODIUM CHLORIDE 0.9% FLUSH
10.0000 mL | Freq: Once | INTRAVENOUS | Status: AC
  Administered 2023-09-13: 10 mL via INTRAVENOUS

## 2023-09-13 MED ORDER — DEXTROSE 5 % IV SOLN: INTRAVENOUS | Status: DC

## 2023-09-13 MED ORDER — MONTELUKAST SODIUM 10 MG PO TABS
10.0000 mg | ORAL_TABLET | Freq: Once | ORAL | Status: AC
Start: 2023-09-13 — End: 2023-09-13
  Administered 2023-09-13: 10 mg via ORAL
  Filled 2023-09-13: qty 1

## 2023-09-13 MED ORDER — PALONOSETRON HCL INJECTION 0.25 MG/5ML
0.2500 mg | Freq: Once | INTRAVENOUS | Status: AC
  Administered 2023-09-13: 0.25 mg via INTRAVENOUS
  Filled 2023-09-13: qty 5

## 2023-09-13 MED ORDER — SODIUM CHLORIDE 0.9 % IV SOLN: Freq: Once | INTRAVENOUS | Status: AC

## 2023-09-13 MED ORDER — DEXAMETHASONE SODIUM PHOSPHATE 10 MG/ML IJ SOLN
10.0000 mg | Freq: Once | INTRAMUSCULAR | Status: AC
  Administered 2023-09-13: 10 mg via INTRAVENOUS
  Filled 2023-09-13: qty 1

## 2023-09-13 MED ORDER — CETUXIMAB CHEMO IV INJECTION 200 MG/100ML
500.0000 mg/m2 | Freq: Once | INTRAVENOUS | Status: AC
  Administered 2023-09-13: 800 mg via INTRAVENOUS
  Filled 2023-09-13: qty 400

## 2023-09-13 MED ORDER — LEUCOVORIN CALCIUM INJECTION 350 MG
240.0000 mg/m2 | Freq: Once | INTRAVENOUS | Status: AC
  Administered 2023-09-13: 388 mg via INTRAVENOUS
  Filled 2023-09-13: qty 19.4

## 2023-09-13 MED ORDER — OXALIPLATIN CHEMO INJECTION 100 MG/20ML
51.0000 mg/m2 | Freq: Once | INTRAVENOUS | Status: AC
  Administered 2023-09-13: 85 mg via INTRAVENOUS
  Filled 2023-09-13: qty 17

## 2023-09-13 MED ORDER — FLUOROURACIL CHEMO INJECTION 500 MG/10ML
240.0000 mg/m2 | Freq: Once | INTRAVENOUS | Status: AC
  Administered 2023-09-13: 400 mg via INTRAVENOUS
  Filled 2023-09-13: qty 8

## 2023-09-13 MED ORDER — DIPHENHYDRAMINE HCL 50 MG/ML IJ SOLN
25.0000 mg | Freq: Once | INTRAMUSCULAR | Status: AC
  Administered 2023-09-13: 25 mg via INTRAVENOUS
  Filled 2023-09-13: qty 1

## 2023-09-13 MED ORDER — SODIUM CHLORIDE 0.9 % IV SOLN
1440.0000 mg/m2 | INTRAVENOUS | Status: DC
  Administered 2023-09-13: 2500 mg via INTRAVENOUS
  Filled 2023-09-13: qty 50

## 2023-09-13 MED ORDER — FAMOTIDINE IN NACL 20-0.9 MG/50ML-% IV SOLN
20.0000 mg | Freq: Once | INTRAVENOUS | Status: AC
Start: 2023-09-13 — End: 2023-09-13
  Administered 2023-09-13: 20 mg via INTRAVENOUS
  Filled 2023-09-13: qty 50

## 2023-09-13 NOTE — Progress Notes (Signed)
 Clarified dose of Erbitux  today to be at 500 mg/m2  V.O. Dr Davina Ester, PharmD

## 2023-09-13 NOTE — Patient Instructions (Signed)

## 2023-09-13 NOTE — Addendum Note (Signed)
 Addended by: Alen Amy on: 09/13/2023 01:37 PM   Modules accepted: Orders

## 2023-09-13 NOTE — Progress Notes (Signed)
 Patient is taking Braftovi  as prescribed. She has not missed any doses and reports no side effects at this time.    Patient has been examined by Dr. Cheree Cords. Vital signs and labs have been reviewed by MD - ANC, Creatinine, LFTs, hemoglobin, and platelets are within treatment parameters per M.D. - pt may proceed with treatment.  Primary RN and pharmacy notified.

## 2023-09-13 NOTE — Patient Instructions (Signed)
 CH [facility] CTR [place] - A DEPT OF [facility]  Discharge Instructions: Thank you for choosing [facility] to provide your oncology and hematology care.  If you have a lab appointment with the Cancer Center - please note that after April 8th, 2024, all labs will be drawn in the cancer center.  You do not have to check in or register with the main entrance as you have in the past but will complete your check-in in the cancer center.  Wear comfortable clothing and clothing appropriate for easy access to any Portacath or PICC line.   We strive to give you quality time with your provider. You may need to reschedule your appointment if you arrive late (15 or more minutes).  Arriving late affects you and other patients whose appointments are after yours.  Also, if you miss three or more appointments without notifying the office, you may be dismissed from the clinic at the provider's discretion.      For prescription refill requests, have your pharmacy contact our office and allow 72 hours for refills to be completed.    Today you received the following chemotherapy and/or immunotherapy agents Erbitux , oxaplatin, leucovorin , adrucil     To help prevent nausea and vomiting after your treatment, we encourage you to take your nausea medication as directed.  BELOW ARE SYMPTOMS THAT SHOULD BE REPORTED IMMEDIATELY: *FEVER GREATER THAN 100.4 F (38 C) OR HIGHER *CHILLS OR SWEATING *NAUSEA AND VOMITING THAT IS NOT CONTROLLED WITH YOUR NAUSEA MEDICATION *UNUSUAL SHORTNESS OF BREATH *UNUSUAL BRUISING OR BLEEDING *URINARY PROBLEMS (pain or burning when urinating, or frequent urination) *BOWEL PROBLEMS (unusual diarrhea, constipation, pain near the anus) TENDERNESS IN MOUTH AND THROAT WITH OR WITHOUT PRESENCE OF ULCERS (sore throat, sores in mouth, or a toothache) UNUSUAL RASH, SWELLING OR PAIN  UNUSUAL VAGINAL DISCHARGE OR ITCHING   Items with * indicate a potential  emergency and should be followed up as soon as possible or go to the Emergency Department if any problems should occur.  Please show the CHEMOTHERAPY ALERT CARD or IMMUNOTHERAPY ALERT CARD at check-in to the Emergency Department and triage nurse.  Should you have questions after your visit or need to cancel or reschedule your appointment, please contact [facility] CANCER CTR [place] - A DEPT OF [facility] [phone]  and follow the prompts.  Office hours are 8:00 a.m. to 4:30 p.m. Monday - Friday. Please note that voicemails left after 4:00 p.m. may not be returned until the following business day.  We are closed weekends and major holidays. You have access to a nurse at all times for urgent questions. Please call the main number to the clinic [phone] and follow the prompts.  For any non-urgent questions, you may also contact your provider using MyChart. We now offer e-Visits for anyone 26 and older to request care online for non-urgent symptoms. For details visit [email or web address].   Also download the MyChart app! Go to the app store, search "MyChart", open the app, select [place], and log in with your MyChart username and password.

## 2023-09-13 NOTE — Progress Notes (Signed)
 Dye study performed on 08/26/23. Report reviewed by MD. Per Dr Cheree Cords and Dr Collene Dawson okay to proceed with treatment and if port begins to stop flushing well then port placement will need to be reassessed by Dr Collene Dawson. Pt updated and agrees to plans.  Port flushes with ease. Per pt no pain or discomfort when port is being flushed. No swelling noted. No blood return noted. Will continue with chemotherapy treatment per MD.   Patient tolerated chemotherapy with no complaints voiced.  Side effects with management reviewed with understanding verbalized.  Port site clean and dry with no bruising or swelling noted at site. No blood return noted before and after administration of chemotherapy. Port flushed prior to chemotherapy pump placement. No pain, no swelling, and no discomfort noted. No resistance felt when flushing. Chemotherapy pump started for home use per MD orders. Pt due to return to clinic 09/15/23 for pump stop. Patient left in satisfactory condition with VSS and no s/s of distress noted. All follow ups as scheduled.   Ronette Hank

## 2023-09-14 ENCOUNTER — Other Ambulatory Visit: Payer: Self-pay

## 2023-09-14 ENCOUNTER — Telehealth: Payer: Self-pay | Admitting: *Deleted

## 2023-09-14 NOTE — Telephone Encounter (Signed)
 Patient has rash above her breasts and spiked a fever of 100.2 last night. Low grade at this point. Not associated with any other symptoms. Has been taking benadryl . She comes tomorrow for pump stop. Advised that she go to the ER if temp >=100.5. Patient will be assessed by nursing staff at appointment.

## 2023-09-15 ENCOUNTER — Other Ambulatory Visit (HOSPITAL_COMMUNITY): Payer: Self-pay

## 2023-09-15 ENCOUNTER — Other Ambulatory Visit: Payer: Self-pay | Admitting: *Deleted

## 2023-09-15 ENCOUNTER — Other Ambulatory Visit: Payer: Self-pay

## 2023-09-15 ENCOUNTER — Encounter: Payer: Self-pay | Admitting: Hematology

## 2023-09-15 ENCOUNTER — Other Ambulatory Visit: Payer: Self-pay | Admitting: Hematology

## 2023-09-15 ENCOUNTER — Inpatient Hospital Stay

## 2023-09-15 VITALS — BP 142/56 | HR 82 | Temp 96.6°F | Resp 18

## 2023-09-15 DIAGNOSIS — R7989 Other specified abnormal findings of blood chemistry: Secondary | ICD-10-CM | POA: Diagnosis not present

## 2023-09-15 DIAGNOSIS — E878 Other disorders of electrolyte and fluid balance, not elsewhere classified: Secondary | ICD-10-CM | POA: Diagnosis not present

## 2023-09-15 DIAGNOSIS — F419 Anxiety disorder, unspecified: Secondary | ICD-10-CM | POA: Diagnosis not present

## 2023-09-15 DIAGNOSIS — C183 Malignant neoplasm of hepatic flexure: Secondary | ICD-10-CM | POA: Diagnosis not present

## 2023-09-15 DIAGNOSIS — C189 Malignant neoplasm of colon, unspecified: Secondary | ICD-10-CM

## 2023-09-15 DIAGNOSIS — C182 Malignant neoplasm of ascending colon: Secondary | ICD-10-CM

## 2023-09-15 DIAGNOSIS — M546 Pain in thoracic spine: Secondary | ICD-10-CM | POA: Diagnosis not present

## 2023-09-15 DIAGNOSIS — Z5112 Encounter for antineoplastic immunotherapy: Secondary | ICD-10-CM | POA: Diagnosis not present

## 2023-09-15 DIAGNOSIS — Z95828 Presence of other vascular implants and grafts: Secondary | ICD-10-CM

## 2023-09-15 DIAGNOSIS — C787 Secondary malignant neoplasm of liver and intrahepatic bile duct: Secondary | ICD-10-CM | POA: Diagnosis not present

## 2023-09-15 DIAGNOSIS — Z5111 Encounter for antineoplastic chemotherapy: Secondary | ICD-10-CM | POA: Diagnosis not present

## 2023-09-15 MED ORDER — SODIUM CHLORIDE 0.9% FLUSH
10.0000 mL | INTRAVENOUS | Status: DC | PRN
  Administered 2023-09-15: 10 mL

## 2023-09-15 MED ORDER — ONDANSETRON HCL 8 MG PO TABS
8.0000 mg | ORAL_TABLET | Freq: Three times a day (TID) | ORAL | 3 refills | Status: AC | PRN
  Filled 2023-09-15: qty 20, 7d supply, fill #0

## 2023-09-15 MED ORDER — BRAFTOVI 75 MG PO CAPS
225.0000 mg | ORAL_CAPSULE | Freq: Every day | ORAL | 0 refills | Status: DC
Start: 2023-09-15 — End: 2023-09-27
  Filled 2023-09-15: qty 90, 30d supply, fill #0

## 2023-09-15 MED ORDER — HEPARIN SOD (PORK) LOCK FLUSH 100 UNIT/ML IV SOLN
500.0000 [IU] | Freq: Once | INTRAVENOUS | Status: AC | PRN
  Administered 2023-09-15: 500 [IU]

## 2023-09-15 NOTE — Progress Notes (Signed)
Patient presents today for 5FU pump stop and disconnection after 46 hour continous infusion.   5FU pump deaccessed.  Patients port flushed without difficulty.  Good blood return noted with no bruising or swelling noted at site.  Needle removed intact.  Band aid applied.  VSS with discharge and left in satisfactory condition with no s/s of distress noted.

## 2023-09-15 NOTE — Progress Notes (Signed)
 Specialty Pharmacy Refill Coordination Note  Patricia Hurst is a 78 y.o. female contacted today regarding refills of specialty medication(s) Encorafenib  (BRAFTOVI )   Patient requested Delivery   Delivery date: 09/22/23   Verified address: [address] [place] [address]   Medication will be filled on 09/21/23.  This fill date is pending response to refill request from provider. Patient is aware and if they have not received fill by intended date they must follow up with pharmacy.

## 2023-09-15 NOTE — Telephone Encounter (Signed)
 Refill for Braftovi  approved.  Patient is tolerating and is to continue therapy.

## 2023-09-15 NOTE — Progress Notes (Signed)
 Specialty Pharmacy Ongoing Clinical Assessment Note  Patricia Hurst is a 78 y.o. female who is being followed by the specialty pharmacy service for RxSp Oncology   Patient's specialty medication(s) reviewed today: Encorafenib  (BRAFTOVI )   Missed doses in the last 4 weeks: 0   Patient/Caregiver did not have any additional questions or concerns.   Therapeutic benefit summary: Patient is achieving benefit   Adverse events/side effects summary: Experienced adverse events/side effects (Patient reports a decrease in appetite (pt is drinking Ensure shakes to maintain proper intake), she reports her tongue feeling dry (she is using Biotene mouthwash which helps); all side effects are tolerable at this time.)   Patient's therapy is appropriate to: Continue    Goals Addressed             This Visit's Progress    Slow Disease Progression   No change    Patient is initiating therapy. Patient will maintain adherence         Follow up: 3 months  Malachi Screws Specialty Pharmacist

## 2023-09-15 NOTE — Patient Instructions (Signed)
 [facility] [place] - A DEPT OF [facility]  Discharge Instructions: Thank you for choosing [facility] to provide your oncology and hematology care.  If you have a lab appointment with the Cancer Center - please note that after April 8th, 2024, all labs will be drawn in the cancer center.  You do not have to check in or register with the main entrance as you have in the past but will complete your check-in in the cancer center.  Wear comfortable clothing and clothing appropriate for easy access to any Portacath or PICC line.   We strive to give you quality time with your provider. You may need to reschedule your appointment if you arrive late (15 or more minutes).  Arriving late affects you and other patients whose appointments are after yours.  Also, if you miss three or more appointments without notifying the office, you may be dismissed from the clinic at the provider's discretion.      For prescription refill requests, have your pharmacy contact our office and allow 72 hours for refills to be completed.    Today you received the following chemotherapy and/or immunotherapy agents pump stop      To help prevent nausea and vomiting after your treatment, we encourage you to take your nausea medication as directed.  BELOW ARE SYMPTOMS THAT SHOULD BE REPORTED IMMEDIATELY: *FEVER GREATER THAN 100.4 F (38 C) OR HIGHER *CHILLS OR SWEATING *NAUSEA AND VOMITING THAT IS NOT CONTROLLED WITH YOUR NAUSEA MEDICATION *UNUSUAL SHORTNESS OF BREATH *UNUSUAL BRUISING OR BLEEDING *URINARY PROBLEMS (pain or burning when urinating, or frequent urination) *BOWEL PROBLEMS (unusual diarrhea, constipation, pain near the anus) TENDERNESS IN MOUTH AND THROAT WITH OR WITHOUT PRESENCE OF ULCERS (sore throat, sores in mouth, or a toothache) UNUSUAL RASH, SWELLING OR PAIN  UNUSUAL VAGINAL DISCHARGE OR ITCHING   Items with * indicate a potential emergency and should be followed up  as soon as possible or go to the Emergency Department if any problems should occur.  Please show the CHEMOTHERAPY ALERT CARD or IMMUNOTHERAPY ALERT CARD at check-in to the Emergency Department and triage nurse.  Should you have questions after your visit or need to cancel or reschedule your appointment, please contact [facility] CANCER CTR [place] - A DEPT OF [facility] [phone]  and follow the prompts.  Office hours are 8:00 a.m. to 4:30 p.m. Monday - Friday. Please note that voicemails left after 4:00 p.m. may not be returned until the following business day.  We are closed weekends and major holidays. You have access to a nurse at all times for urgent questions. Please call the main number to the clinic [phone] and follow the prompts.  For any non-urgent questions, you may also contact your provider using MyChart. We now offer e-Visits for anyone 60 and older to request care online for non-urgent symptoms. For details visit [email or web address].   Also download the MyChart app! Go to the app store, search "MyChart", open the app, select [place], and log in with your MyChart username and password.

## 2023-09-16 ENCOUNTER — Other Ambulatory Visit (HOSPITAL_COMMUNITY): Payer: Self-pay

## 2023-09-16 ENCOUNTER — Emergency Department (HOSPITAL_COMMUNITY)
Admission: EM | Admit: 2023-09-16 | Discharge: 2023-09-17 | Disposition: A | Discharge status: Home / Self Care | Attending: Emergency Medicine | Admitting: Emergency Medicine

## 2023-09-16 ENCOUNTER — Encounter (HOSPITAL_COMMUNITY): Payer: Self-pay

## 2023-09-16 ENCOUNTER — Other Ambulatory Visit: Payer: Self-pay

## 2023-09-16 DIAGNOSIS — C189 Malignant neoplasm of colon, unspecified: Secondary | ICD-10-CM | POA: Insufficient documentation

## 2023-09-16 DIAGNOSIS — K140 Glossitis: Secondary | ICD-10-CM | POA: Insufficient documentation

## 2023-09-16 DIAGNOSIS — Z7982 Long term (current) use of aspirin: Secondary | ICD-10-CM | POA: Diagnosis not present

## 2023-09-16 DIAGNOSIS — K1230 Oral mucositis (ulcerative), unspecified: Secondary | ICD-10-CM | POA: Diagnosis not present

## 2023-09-16 DIAGNOSIS — R220 Localized swelling, mass and lump, head: Secondary | ICD-10-CM | POA: Diagnosis present

## 2023-09-16 DIAGNOSIS — K121 Other forms of stomatitis: Secondary | ICD-10-CM | POA: Insufficient documentation

## 2023-09-16 NOTE — ED Triage Notes (Signed)
 Pt currently going through cancer tx and recently had tx on Tuesday, woke up today to having slightly swollen, red and sore mouth and lips. No breathing issues at this time.

## 2023-09-17 NOTE — ED Notes (Signed)
 ED Provider at bedside.

## 2023-09-17 NOTE — ED Provider Notes (Signed)
 [place] EMERGENCY DEPARTMENT AT [facility] Provider Note   CSN: [id number] Arrival date & time: 09/16/23  1746     History  Chief Complaint  Patient presents with   Oral Swelling    Patricia Hurst is a 78 y.o. female.  The history is provided by the patient.  She has history of hyperlipidemia, colon cancer on chemotherapy and comes in because of pain and swelling of her lower lip and tongue which started following chemotherapy 2 days ago and has been getting worse.  She states that it is sore to the point where it hurts to even brush her teeth.  She has been able to take fluids in spite of the pain and swelling.   Home Medications Prior to Admission medications   Medication Sig Start Date End Date Taking? Authorizing Provider  acetaminophen  (TYLENOL ) 500 MG tablet Take 500 mg by mouth See admin instructions. Take 500 mg at night, may take a second 500 mg dose as needed for pain    [provider]  ALPRAZolam  (XANAX ) 0.25 MG tablet Take 1 tablet (0.25 mg total) by mouth daily as needed for anxiety. 07/28/23   Paulett Boros, MD  aspirin EC 81 MG tablet Take 1 tablet (81 mg total) by mouth daily. 05/18/19   Rehman, Mathews Solomons, MD  brimonidine (ALPHAGAN) 0.2 % ophthalmic solution Place 1 drop into both eyes 2 (two) times daily. 01/13/23   [provider]  Calcium  Carb-Cholecalciferol (CALCIUM  500 + D3 PO) Take 1 tablet by mouth at bedtime.     [provider]  Cholecalciferol (VITAMIN D3 PO) Take by mouth daily at 6 (six) AM.    [provider]  encorafenib  (BRAFTOVI ) 75 MG capsule Take 3 capsules (225 mg total) by mouth daily. 09/15/23   Paulett Boros, MD  ketorolac  (ACULAR ) 0.5 % ophthalmic solution Place 1 drop into the right eye 4 (four) times daily. 04/22/23   [provider]  latanoprost (XALATAN) 0.005 % ophthalmic solution Place 1 drop into both eyes at bedtime. 04/03/20   [provider]  levothyroxine   (SYNTHROID ) 25 MCG tablet Take 25 mcg by mouth daily before breakfast.    [provider]  lidocaine -prilocaine  (EMLA ) cream Apply to affected area once 08/23/23   Katragadda, Sreedhar, MD  Menthol, Topical Analgesic, (BLUE-EMU MAXIMUM STRENGTH EX) Apply 1 application topically daily as needed (joint pain).    [provider]  Multiple Vitamin (MULTIVITAMIN WITH MINERALS) TABS tablet Take 1 tablet by mouth at bedtime.    [provider]  ondansetron  (ZOFRAN ) 8 MG tablet Take 1 tablet (8 mg total) by mouth every 8 (eight) hours as needed for nausea or vomiting. 09/15/23   Paulett Boros, MD  prochlorperazine  (COMPAZINE ) 10 MG tablet Take 1 tablet (10 mg total) by mouth every 6 (six) hours as needed for nausea or vomiting. 08/23/23   Paulett Boros, MD  psyllium (METAMUCIL) 58.6 % powder Take 1 packet by mouth daily as needed (for constipation). Mixed with OJ    [provider]  rosuvastatin (CRESTOR) 5 MG tablet Take 5 mg by mouth at bedtime. 06/27/23   [provider]  timolol  (TIMOPTIC ) 0.5 % ophthalmic solution Place 1 drop into both eyes daily. 02/05/19   [provider]  traMADol  (ULTRAM ) 50 MG tablet Take 1 tablet (50 mg total) by mouth every 6 (six) hours as needed. 09/07/23   Paulett Boros, MD      Allergies    Morphine sulfate  Review of Systems   Review of Systems  All other systems reviewed and are negative.   Physical Exam Updated Vital Signs BP (!) 160/61 (BP Location: Right Arm)   Pulse 71   Temp 98 F (36.7 C) (Oral)   Resp 19   Ht 5\' 2"  (1.575 m)   Wt 58 kg   SpO2 100%   BMI 23.39 kg/m  Physical Exam Vitals and nursing note reviewed.   78 year old female, resting comfortably and in no acute distress. Vital signs are significant for elevated blood pressure. Oxygen  saturation is 100%, which is normal. Head is normocephalic and atraumatic. PERRLA, EOMI. there is mild erythema and swelling of the lower  lip and mild to moderate erythema and swelling of the tongue.  There is no swelling of the sublingual tissue or pharynx.  She has no difficulty with secretions.  No oral ulcers or lesions of Candida are noted. Neck is nontender and supple without adenopathy. Lungs are clear without rales, wheezes, or rhonchi. Chest is nontender. Heart has regular rate and rhythm without murmur. Skin is warm and dry without other rash. Neurologic: Mental status is normal, moves all extremities equally.  ED Results / Procedures / Treatments    Procedures Procedures    Medications Ordered in ED Medications - No data to display  ED Course/ Medical Decision Making/ A&P                                 Medical Decision Making  Glossitis and show it is likely related to chemotherapy.  I have reviewed her past records, and she received her second course of oxaliplatin , fluorouracil , cetuximab  on 5/20 with fluorouracil  pump disconnected on 5/22.  Colitis and glossitis are known side effects of fluorouracil  and this is the most likely cause of her pain and swelling.  No evidence of oral thrush.  He has been taking tramadol  for pain which has been giving her adequate relief.  I have advised her to continue using tramadol  and supplement with acetaminophen  and ibuprofen as needed.  Also, consider pretreatment with allopurinol mouthwash before next chemotherapy session.  Final Clinical Impression(s) / ED Diagnoses Final diagnoses:  Glossitis  Stomatitis    Rx / DC Orders ED Discharge Orders     None         Alissa April, MD 09/17/23 [phone]

## 2023-09-17 NOTE — ED Notes (Signed)
 Patient verbalizes understanding of discharge instructions. Opportunity for questioning and answers were provided. Armband removed by staff, pt discharged from ED. Ambulated out to lobby with husband and son

## 2023-09-17 NOTE — Discharge Instructions (Signed)
 Try sucking on ice or and drinking cool beverages.  Make sure to drink plenty of fluids.  You may take tramadol  as needed for pain.  If you need additional pain relief, you may add acetaminophen  and/or ibuprofen.  Tramadol , acetaminophen , ibuprofen all work on pain in different ways and when combined with each other give you better pain relief than any of the medications by themselves.  Please make sure that your oncologist is aware that you had this problem.  He may be able to give you some treatment to prevent it from occurring with your next round of chemotherapy.

## 2023-09-20 ENCOUNTER — Inpatient Hospital Stay

## 2023-09-20 ENCOUNTER — Telehealth: Payer: Self-pay | Admitting: *Deleted

## 2023-09-20 ENCOUNTER — Other Ambulatory Visit: Payer: Self-pay | Admitting: *Deleted

## 2023-09-20 VITALS — BP 138/54 | HR 76 | Temp 98.0°F | Resp 18

## 2023-09-20 DIAGNOSIS — C787 Secondary malignant neoplasm of liver and intrahepatic bile duct: Secondary | ICD-10-CM | POA: Diagnosis not present

## 2023-09-20 DIAGNOSIS — C183 Malignant neoplasm of hepatic flexure: Secondary | ICD-10-CM | POA: Diagnosis not present

## 2023-09-20 DIAGNOSIS — Z5112 Encounter for antineoplastic immunotherapy: Secondary | ICD-10-CM | POA: Diagnosis not present

## 2023-09-20 DIAGNOSIS — C182 Malignant neoplasm of ascending colon: Secondary | ICD-10-CM

## 2023-09-20 DIAGNOSIS — F419 Anxiety disorder, unspecified: Secondary | ICD-10-CM | POA: Diagnosis not present

## 2023-09-20 DIAGNOSIS — E878 Other disorders of electrolyte and fluid balance, not elsewhere classified: Secondary | ICD-10-CM | POA: Diagnosis not present

## 2023-09-20 DIAGNOSIS — Z5111 Encounter for antineoplastic chemotherapy: Secondary | ICD-10-CM | POA: Diagnosis not present

## 2023-09-20 DIAGNOSIS — M546 Pain in thoracic spine: Secondary | ICD-10-CM | POA: Diagnosis not present

## 2023-09-20 DIAGNOSIS — R7989 Other specified abnormal findings of blood chemistry: Secondary | ICD-10-CM | POA: Diagnosis not present

## 2023-09-20 LAB — CBC WITH DIFFERENTIAL/PLATELET
Abs Immature Granulocytes: 0.05 10*3/uL (ref 0.00–0.07)
Basophils Absolute: 0.1 10*3/uL (ref 0.0–0.1)
Basophils Relative: 1 %
Eosinophils Absolute: 0.8 10*3/uL — ABNORMAL HIGH (ref 0.0–0.5)
Eosinophils Relative: 16 %
HCT: 32.4 % — ABNORMAL LOW (ref 36.0–46.0)
Hemoglobin: 11.8 g/dL — ABNORMAL LOW (ref 12.0–15.0)
Immature Granulocytes: 1 %
Lymphocytes Relative: 16 %
Lymphs Abs: 0.8 10*3/uL (ref 0.7–4.0)
MCH: 32.1 pg (ref 26.0–34.0)
MCHC: 36.4 g/dL — ABNORMAL HIGH (ref 30.0–36.0)
MCV: 88 fL (ref 80.0–100.0)
Monocytes Absolute: 0.6 10*3/uL (ref 0.1–1.0)
Monocytes Relative: 11 %
Neutro Abs: 2.8 10*3/uL (ref 1.7–7.7)
Neutrophils Relative %: 55 %
Platelets: 184 10*3/uL (ref 150–400)
RBC: 3.68 MIL/uL — ABNORMAL LOW (ref 3.87–5.11)
RDW: 12.1 % (ref 11.5–15.5)
WBC: 5 10*3/uL (ref 4.0–10.5)
nRBC: 0 % (ref 0.0–0.2)

## 2023-09-20 LAB — COMPREHENSIVE METABOLIC PANEL WITH GFR
ALT: 48 U/L — ABNORMAL HIGH (ref 0–44)
AST: 117 U/L — ABNORMAL HIGH (ref 15–41)
Albumin: 3.2 g/dL — ABNORMAL LOW (ref 3.5–5.0)
Alkaline Phosphatase: 49 U/L (ref 38–126)
Anion gap: 9 (ref 5–15)
BUN: 8 mg/dL (ref 8–23)
CO2: 23 mmol/L (ref 22–32)
Calcium: 8.4 mg/dL — ABNORMAL LOW (ref 8.9–10.3)
Chloride: 102 mmol/L (ref 98–111)
Creatinine, Ser: 1.06 mg/dL — ABNORMAL HIGH (ref 0.44–1.00)
GFR, Estimated: 54 mL/min — ABNORMAL LOW (ref >60)
Glucose, Bld: 117 mg/dL — ABNORMAL HIGH (ref 70–99)
Potassium: 4 mmol/L (ref 3.5–5.1)
Sodium: 134 mmol/L — ABNORMAL LOW (ref 135–145)
Total Bilirubin: 1.8 mg/dL — ABNORMAL HIGH (ref 0.0–1.2)
Total Protein: 5.9 g/dL — ABNORMAL LOW (ref 6.5–8.1)

## 2023-09-20 LAB — MAGNESIUM: Magnesium: 1.4 mg/dL — ABNORMAL LOW (ref 1.7–2.4)

## 2023-09-20 MED ORDER — LIDOCAINE VISCOUS HCL 2 % MT SOLN: 15.0000 mL | Freq: Four times a day (QID) | OROMUCOSAL | 2 refills | Status: AC | PRN

## 2023-09-20 MED ORDER — MAGNESIUM SULFATE 2 GM/50ML IV SOLN
2.0000 g | Freq: Once | INTRAVENOUS | Status: AC
  Administered 2023-09-20: 2 g via INTRAVENOUS
  Filled 2023-09-20: qty 50

## 2023-09-20 MED ORDER — HEPARIN SOD (PORK) LOCK FLUSH 100 UNIT/ML IV SOLN
500.0000 [IU] | Freq: Once | INTRAVENOUS | Status: AC
  Administered 2023-09-20: 500 [IU] via INTRAVENOUS

## 2023-09-20 MED ORDER — ALPRAZOLAM 0.25 MG PO TABS
0.2500 mg | ORAL_TABLET | Freq: Once | ORAL | Status: AC
  Administered 2023-09-20: 0.25 mg via ORAL
  Filled 2023-09-20: qty 1

## 2023-09-20 MED ORDER — LOPERAMIDE HCL 2 MG PO CAPS: ORAL_CAPSULE | ORAL | 0 refills | Status: AC

## 2023-09-20 MED ORDER — LOPERAMIDE HCL 2 MG PO CAPS
2.0000 mg | ORAL_CAPSULE | ORAL | Status: AC | PRN
  Administered 2023-09-20: 4 mg via ORAL
  Filled 2023-09-20: qty 1

## 2023-09-20 MED ORDER — ALUMINUM & MAGNESIUM HYDROXIDE 200-200 MG/5ML PO SUSP: 15.0000 mL | Freq: Four times a day (QID) | ORAL | 2 refills | Status: AC | PRN

## 2023-09-20 MED ORDER — LIDOCAINE VISCOUS HCL 2 % MT SOLN
15.0000 mL | Freq: Once | OROMUCOSAL | Status: AC
  Administered 2023-09-20: 15 mL via OROMUCOSAL
  Filled 2023-09-20: qty 15

## 2023-09-20 MED ORDER — SODIUM CHLORIDE 0.9% FLUSH
10.0000 mL | INTRAVENOUS | Status: AC | PRN
  Administered 2023-09-20: 10 mL via INTRAVENOUS

## 2023-09-20 MED ORDER — DIPHENOXYLATE-ATROPINE 2.5-0.025 MG PO TABS: 2.0000 | ORAL_TABLET | Freq: Four times a day (QID) | ORAL | 1 refills | Status: AC | PRN

## 2023-09-20 MED ORDER — POTASSIUM CHLORIDE IN NACL 20-0.9 MEQ/L-% IV SOLN
Freq: Once | INTRAVENOUS | Status: AC
  Filled 2023-09-20: qty 1000

## 2023-09-20 NOTE — Telephone Encounter (Signed)
 Patient called stating that she went to the ER over the weekend.  Today she states she has not eaten in several days and is very weak.  Will bring in for fluids today.  Dr. Katragadda made aware.

## 2023-09-21 ENCOUNTER — Inpatient Hospital Stay: Admitting: Licensed Clinical Social Worker

## 2023-09-21 ENCOUNTER — Telehealth: Payer: Self-pay | Admitting: *Deleted

## 2023-09-21 NOTE — Telephone Encounter (Signed)
 Received call from son Patricia Hurst stating that patient would like to cancel any future treatment appointments, as she does not wish to proceed with treatment.  Will be here Friday for fluids and Tuesday for follow up discussion with Dr. Cheree Cords.  MD aware.

## 2023-09-23 ENCOUNTER — Inpatient Hospital Stay

## 2023-09-23 DIAGNOSIS — F419 Anxiety disorder, unspecified: Secondary | ICD-10-CM | POA: Diagnosis not present

## 2023-09-23 DIAGNOSIS — Z5112 Encounter for antineoplastic immunotherapy: Secondary | ICD-10-CM | POA: Diagnosis not present

## 2023-09-23 DIAGNOSIS — M546 Pain in thoracic spine: Secondary | ICD-10-CM | POA: Diagnosis not present

## 2023-09-23 DIAGNOSIS — C183 Malignant neoplasm of hepatic flexure: Secondary | ICD-10-CM | POA: Diagnosis not present

## 2023-09-23 DIAGNOSIS — Z5111 Encounter for antineoplastic chemotherapy: Secondary | ICD-10-CM | POA: Diagnosis not present

## 2023-09-23 DIAGNOSIS — R7989 Other specified abnormal findings of blood chemistry: Secondary | ICD-10-CM | POA: Diagnosis not present

## 2023-09-23 DIAGNOSIS — C787 Secondary malignant neoplasm of liver and intrahepatic bile duct: Secondary | ICD-10-CM | POA: Diagnosis not present

## 2023-09-23 DIAGNOSIS — E878 Other disorders of electrolyte and fluid balance, not elsewhere classified: Secondary | ICD-10-CM | POA: Diagnosis not present

## 2023-09-23 MED ORDER — HEPARIN SOD (PORK) LOCK FLUSH 100 UNIT/ML IV SOLN
500.0000 [IU] | Freq: Once | INTRAVENOUS | Status: AC
  Administered 2023-09-23: 500 [IU] via INTRAVENOUS

## 2023-09-23 MED ORDER — POTASSIUM CHLORIDE IN NACL 20-0.9 MEQ/L-% IV SOLN
INTRAVENOUS | Status: DC
  Filled 2023-09-23 (×2): qty 1000

## 2023-09-23 MED ORDER — MAGNESIUM SULFATE 2 GM/50ML IV SOLN
2.0000 g | Freq: Once | INTRAVENOUS | Status: AC
  Administered 2023-09-23: 2 g via INTRAVENOUS
  Filled 2023-09-23: qty 50

## 2023-09-23 NOTE — Progress Notes (Signed)
 Patient presents to [facility] for house fluids. Orders placed per MD standing orders. Pt has no complaints and states that "she is able to eat a little bit and is feeling better."  Patient tolerated hydration fluids with no complaints voiced.  Port site clean and dry with good blood return noted before and after hydration.  No bruising or swelling noted with port.  Band aid applied.  VSS with discharge and left ambulatory with no s/s of distress noted.  All follow ups as scheduled.   Patricia Hurst

## 2023-09-26 ENCOUNTER — Other Ambulatory Visit (HOSPITAL_COMMUNITY): Payer: Self-pay

## 2023-09-27 ENCOUNTER — Inpatient Hospital Stay: Attending: Hematology | Admitting: Hematology

## 2023-09-27 ENCOUNTER — Other Ambulatory Visit: Payer: Self-pay | Admitting: *Deleted

## 2023-09-27 ENCOUNTER — Inpatient Hospital Stay

## 2023-09-27 VITALS — BP 138/77 | HR 81 | Temp 97.6°F | Resp 18 | Wt 120.1 lb

## 2023-09-27 DIAGNOSIS — C183 Malignant neoplasm of hepatic flexure: Secondary | ICD-10-CM | POA: Diagnosis not present

## 2023-09-27 DIAGNOSIS — R0781 Pleurodynia: Secondary | ICD-10-CM | POA: Insufficient documentation

## 2023-09-27 DIAGNOSIS — E860 Dehydration: Secondary | ICD-10-CM

## 2023-09-27 DIAGNOSIS — R110 Nausea: Secondary | ICD-10-CM | POA: Diagnosis not present

## 2023-09-27 DIAGNOSIS — F419 Anxiety disorder, unspecified: Secondary | ICD-10-CM | POA: Insufficient documentation

## 2023-09-27 DIAGNOSIS — C182 Malignant neoplasm of ascending colon: Secondary | ICD-10-CM

## 2023-09-27 DIAGNOSIS — Z79899 Other long term (current) drug therapy: Secondary | ICD-10-CM | POA: Diagnosis not present

## 2023-09-27 MED ORDER — MAGNESIUM SULFATE 2 GM/50ML IV SOLN
2.0000 g | Freq: Once | INTRAVENOUS | Status: AC
  Administered 2023-09-27: 2 g via INTRAVENOUS
  Filled 2023-09-27: qty 50

## 2023-09-27 MED ORDER — SODIUM CHLORIDE 0.9% FLUSH
10.0000 mL | INTRAVENOUS | Status: DC | PRN
  Administered 2023-09-27: 10 mL via INTRAVENOUS

## 2023-09-27 MED ORDER — POTASSIUM CHLORIDE IN NACL 20-0.9 MEQ/L-% IV SOLN
Freq: Once | INTRAVENOUS | Status: AC
  Filled 2023-09-27: qty 1000

## 2023-09-27 MED ORDER — HEPARIN SOD (PORK) LOCK FLUSH 100 UNIT/ML IV SOLN
500.0000 [IU] | Freq: Once | INTRAVENOUS | Status: AC
  Administered 2023-09-27: 500 [IU] via INTRAVENOUS

## 2023-09-27 MED ORDER — MEGESTROL ACETATE 400 MG/10ML PO SUSP: 400.0000 mg | Freq: Two times a day (BID) | ORAL | 3 refills | Status: AC

## 2023-09-27 MED ORDER — ALPRAZOLAM 0.25 MG PO TABS
0.2500 mg | ORAL_TABLET | Freq: Once | ORAL | Status: AC
  Administered 2023-09-27: 0.25 mg via ORAL
  Filled 2023-09-27: qty 1

## 2023-09-27 NOTE — Patient Instructions (Signed)
 CH CANCER CTR [place] - A DEPT OF [place]  Discharge Instructions: Thank you for choosing [facility] to provide your oncology and hematology care.  If you have a lab appointment with the Cancer Center - please note that after April 8th, 2024, all labs will be drawn in the cancer center.  You do not have to check in or register with the main entrance as you have in the past but will complete your check-in in the cancer center.  Wear comfortable clothing and clothing appropriate for easy access to any Portacath or PICC line.   We strive to give you quality time with your provider. You may need to reschedule your appointment if you arrive late (15 or more minutes).  Arriving late affects you and other patients whose appointments are after yours.  Also, if you miss three or more appointments without notifying the office, you may be dismissed from the clinic at the provider's discretion.      For prescription refill requests, have your pharmacy contact our office and allow 72 hours for refills to be completed.    Today you received house fluids    BELOW ARE SYMPTOMS THAT SHOULD BE REPORTED IMMEDIATELY: *FEVER GREATER THAN 100.4 F (38 C) OR HIGHER *CHILLS OR SWEATING *NAUSEA AND VOMITING THAT IS NOT CONTROLLED WITH YOUR NAUSEA MEDICATION *UNUSUAL SHORTNESS OF BREATH *UNUSUAL BRUISING OR BLEEDING *URINARY PROBLEMS (pain or burning when urinating, or frequent urination) *BOWEL PROBLEMS (unusual diarrhea, constipation, pain near the anus) TENDERNESS IN MOUTH AND THROAT WITH OR WITHOUT PRESENCE OF ULCERS (sore throat, sores in mouth, or a toothache) UNUSUAL RASH, SWELLING OR PAIN  UNUSUAL VAGINAL DISCHARGE OR ITCHING   Items with * indicate a potential emergency and should be followed up as soon as possible or go to the Emergency Department if any problems should occur.  Please show the CHEMOTHERAPY ALERT CARD or IMMUNOTHERAPY ALERT CARD at check-in to the  Emergency Department and triage nurse.  Should you have questions after your visit or need to cancel or reschedule your appointment, please contact [facility] CANCER CTR [place] - A DEPT OF [facility] [phone]  and follow the prompts.  Office hours are 8:00 a.m. to 4:30 p.m. Monday - Friday. Please note that voicemails left after 4:00 p.m. may not be returned until the following business day.  We are closed weekends and major holidays. You have access to a nurse at all times for urgent questions. Please call the main number to the clinic [phone] and follow the prompts.  For any non-urgent questions, you may also contact your provider using MyChart. We now offer e-Visits for anyone 22 and older to request care online for non-urgent symptoms. For details visit [email or web address].   Also download the MyChart app! Go to the app store, search "MyChart", open the app, select [place], and log in with your MyChart username and password.

## 2023-09-27 NOTE — Progress Notes (Signed)
 Patient present today for house fluids per provider's order. Vital signs stable and patient voiced no new complaints at this time.   Treatment given today per MD orders. Tolerated infusion without adverse affects. Vital signs stable. No complaints at this time. Discharged from clinic ambulatory in stable condition. Alert and oriented x 3. F/U with [facility] as scheduled.
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
## 2023-09-28 ENCOUNTER — Encounter: Payer: Self-pay | Admitting: Hematology

## 2023-09-28 ENCOUNTER — Other Ambulatory Visit: Payer: Self-pay | Admitting: Hematology

## 2023-09-28 ENCOUNTER — Other Ambulatory Visit: Payer: Self-pay

## 2023-09-28 ENCOUNTER — Encounter (HOSPITAL_COMMUNITY): Payer: Self-pay | Admitting: Hematology

## 2023-09-28 MED ORDER — ALPRAZOLAM 0.25 MG PO TABS
0.2500 mg | ORAL_TABLET | Freq: Every day | ORAL | 2 refills | Status: DC | PRN
Start: 2023-09-28 — End: 2023-10-03

## 2023-09-29 ENCOUNTER — Encounter (HOSPITAL_COMMUNITY): Payer: Self-pay | Admitting: Hematology

## 2023-09-29 ENCOUNTER — Inpatient Hospital Stay

## 2023-09-29 ENCOUNTER — Encounter: Payer: Self-pay | Admitting: Hematology

## 2023-09-30 ENCOUNTER — Inpatient Hospital Stay

## 2023-09-30 VITALS — BP 151/69 | HR 71 | Temp 97.0°F | Resp 18

## 2023-09-30 DIAGNOSIS — R0781 Pleurodynia: Secondary | ICD-10-CM | POA: Diagnosis not present

## 2023-09-30 DIAGNOSIS — Z95828 Presence of other vascular implants and grafts: Secondary | ICD-10-CM

## 2023-09-30 DIAGNOSIS — R110 Nausea: Secondary | ICD-10-CM | POA: Diagnosis not present

## 2023-09-30 DIAGNOSIS — C183 Malignant neoplasm of hepatic flexure: Secondary | ICD-10-CM | POA: Diagnosis not present

## 2023-09-30 DIAGNOSIS — C182 Malignant neoplasm of ascending colon: Secondary | ICD-10-CM

## 2023-09-30 DIAGNOSIS — E860 Dehydration: Secondary | ICD-10-CM

## 2023-09-30 DIAGNOSIS — F419 Anxiety disorder, unspecified: Secondary | ICD-10-CM | POA: Diagnosis not present

## 2023-09-30 DIAGNOSIS — Z79899 Other long term (current) drug therapy: Secondary | ICD-10-CM | POA: Diagnosis not present

## 2023-09-30 LAB — COMPREHENSIVE METABOLIC PANEL WITH GFR
ALT: 34 U/L (ref 0–44)
AST: 41 U/L (ref 15–41)
Albumin: 3.2 g/dL — ABNORMAL LOW (ref 3.5–5.0)
Alkaline Phosphatase: 56 U/L (ref 38–126)
Anion gap: 9 (ref 5–15)
BUN: 7 mg/dL — ABNORMAL LOW (ref 8–23)
CO2: 26 mmol/L (ref 22–32)
Calcium: 8.7 mg/dL — ABNORMAL LOW (ref 8.9–10.3)
Chloride: 100 mmol/L (ref 98–111)
Creatinine, Ser: 0.98 mg/dL (ref 0.44–1.00)
GFR, Estimated: 59 mL/min — ABNORMAL LOW (ref >60)
Glucose, Bld: 112 mg/dL — ABNORMAL HIGH (ref 70–99)
Potassium: 4.1 mmol/L (ref 3.5–5.1)
Sodium: 135 mmol/L (ref 135–145)
Total Bilirubin: 1.2 mg/dL (ref 0.0–1.2)
Total Protein: 6 g/dL — ABNORMAL LOW (ref 6.5–8.1)

## 2023-09-30 LAB — MAGNESIUM: Magnesium: 1.8 mg/dL (ref 1.7–2.4)

## 2023-09-30 LAB — CBC WITH DIFFERENTIAL/PLATELET
Abs Immature Granulocytes: 0.01 10*3/uL (ref 0.00–0.07)
Basophils Absolute: 0.1 10*3/uL (ref 0.0–0.1)
Basophils Relative: 1 %
Eosinophils Absolute: 0.2 10*3/uL (ref 0.0–0.5)
Eosinophils Relative: 5 %
HCT: 31.3 % — ABNORMAL LOW (ref 36.0–46.0)
Hemoglobin: 10.8 g/dL — ABNORMAL LOW (ref 12.0–15.0)
Immature Granulocytes: 0 %
Lymphocytes Relative: 26 %
Lymphs Abs: 1.1 10*3/uL (ref 0.7–4.0)
MCH: 31.9 pg (ref 26.0–34.0)
MCHC: 34.5 g/dL (ref 30.0–36.0)
MCV: 92.3 fL (ref 80.0–100.0)
Monocytes Absolute: 0.7 10*3/uL (ref 0.1–1.0)
Monocytes Relative: 17 %
Neutro Abs: 2.2 10*3/uL (ref 1.7–7.7)
Neutrophils Relative %: 51 %
Platelets: 146 10*3/uL — ABNORMAL LOW (ref 150–400)
RBC: 3.39 MIL/uL — ABNORMAL LOW (ref 3.87–5.11)
RDW: 14.1 % (ref 11.5–15.5)
WBC: 4.4 10*3/uL (ref 4.0–10.5)
nRBC: 0 % (ref 0.0–0.2)

## 2023-09-30 MED ORDER — HEPARIN SOD (PORK) LOCK FLUSH 100 UNIT/ML IV SOLN
500.0000 [IU] | Freq: Once | INTRAVENOUS | Status: AC
  Administered 2023-09-30: 500 [IU] via INTRAVENOUS

## 2023-09-30 MED ORDER — MAGNESIUM SULFATE 2 GM/50ML IV SOLN
2.0000 g | Freq: Once | INTRAVENOUS | Status: AC
  Administered 2023-09-30: 2 g via INTRAVENOUS
  Filled 2023-09-30: qty 50

## 2023-09-30 MED ORDER — SODIUM CHLORIDE FLUSH 0.9 % IV SOLN
10.0000 mL | Freq: Once | INTRAVENOUS | Status: AC
  Administered 2023-09-30: 10 mL via INTRAVENOUS
  Filled 2023-09-30: qty 10

## 2023-09-30 MED ORDER — SODIUM CHLORIDE 0.9% FLUSH: 10.0000 mL | INTRAVENOUS | Status: DC

## 2023-09-30 MED ORDER — POTASSIUM CHLORIDE IN NACL 20-0.9 MEQ/L-% IV SOLN
Freq: Once | INTRAVENOUS | Status: AC
  Filled 2023-09-30: qty 1000

## 2023-09-30 NOTE — Patient Instructions (Signed)
 [facility] [place] - A DEPT OF [facility]  Discharge Instructions: Thank you for choosing [facility] to provide your oncology and hematology care.  If you have a lab appointment with the Cancer Center - please note that after April 8th, 2024, all labs will be drawn in the cancer center.  You do not have to check in or register with the main entrance as you have in the past but will complete your check-in in the cancer center.  Wear comfortable clothing and clothing appropriate for easy access to any Portacath or PICC line.   We strive to give you quality time with your provider. You may need to reschedule your appointment if you arrive late (15 or more minutes).  Arriving late affects you and other patients whose appointments are after yours.  Also, if you miss three or more appointments without notifying the office, you may be dismissed from the clinic at the provider's discretion.      For prescription refill requests, have your pharmacy contact our office and allow 72 hours for refills to be completed.    Today you received the following House fluids, return as scheduled.   To help prevent nausea and vomiting after your treatment, we encourage you to take your nausea medication as directed.  BELOW ARE SYMPTOMS THAT SHOULD BE REPORTED IMMEDIATELY: *FEVER GREATER THAN 100.4 F (38 C) OR HIGHER *CHILLS OR SWEATING *NAUSEA AND VOMITING THAT IS NOT CONTROLLED WITH YOUR NAUSEA MEDICATION *UNUSUAL SHORTNESS OF BREATH *UNUSUAL BRUISING OR BLEEDING *URINARY PROBLEMS (pain or burning when urinating, or frequent urination) *BOWEL PROBLEMS (unusual diarrhea, constipation, pain near the anus) TENDERNESS IN MOUTH AND THROAT WITH OR WITHOUT PRESENCE OF ULCERS (sore throat, sores in mouth, or a toothache) UNUSUAL RASH, SWELLING OR PAIN  UNUSUAL VAGINAL DISCHARGE OR ITCHING   Items with * indicate a potential emergency and should be followed up as soon as  possible or go to the Emergency Department if any problems should occur.  Please show the CHEMOTHERAPY ALERT CARD or IMMUNOTHERAPY ALERT CARD at check-in to the Emergency Department and triage nurse.  Should you have questions after your visit or need to cancel or reschedule your appointment, please contact [facility] [place] - A DEPT OF [facility] [phone]  and follow the prompts.  Office hours are 8:00 a.m. to 4:30 p.m. Monday - Friday. Please note that voicemails left after 4:00 p.m. may not be returned until the following business day.  We are closed weekends and major holidays. You have access to a nurse at all times for urgent questions. Please call the main number to the clinic [phone] and follow the prompts.  For any non-urgent questions, you may also contact your provider using MyChart. We now offer e-Visits for anyone 69 and older to request care online for non-urgent symptoms. For details visit [email or web address].   Also download the MyChart app! Go to the app store, search "MyChart", open the app, select [place], and log in with your MyChart username and password.

## 2023-09-30 NOTE — Progress Notes (Signed)
 Patient presents today for house fluids per Dr. Orvis Blare. Patient tolerated therapy with no complaints voiced. Side effects with management reviewed with understanding verbalized. Port site clean and dry with no bruising or swelling noted at site. No blood return noted before and after administration of therapy. Band aid applied. Patient left in satisfactory condition with VSS and no s/s of distress noted.

## 2023-09-30 NOTE — Patient Instructions (Signed)
 [facility] [place] - A DEPT OF [facility]  Discharge Instructions: Thank you for choosing [facility] to provide your oncology and hematology care.  If you have a lab appointment with the Cancer Center - please note that after April 8th, 2024, all labs will be drawn in the cancer center.  You do not have to check in or register with the main entrance as you have in the past but will complete your check-in in the cancer center.  Wear comfortable clothing and clothing appropriate for easy access to any Portacath or PICC line.   We strive to give you quality time with your provider. You may need to reschedule your appointment if you arrive late (15 or more minutes).  Arriving late affects you and other patients whose appointments are after yours.  Also, if you miss three or more appointments without notifying the office, you may be dismissed from the clinic at the provider's discretion.      For prescription refill requests, have your pharmacy contact our office and allow 72 hours for refills to be completed.    Today you received the following labs   To help prevent nausea and vomiting after your treatment, we encourage you to take your nausea medication as directed.  BELOW ARE SYMPTOMS THAT SHOULD BE REPORTED IMMEDIATELY: *FEVER GREATER THAN 100.4 F (38 C) OR HIGHER *CHILLS OR SWEATING *NAUSEA AND VOMITING THAT IS NOT CONTROLLED WITH YOUR NAUSEA MEDICATION *UNUSUAL SHORTNESS OF BREATH *UNUSUAL BRUISING OR BLEEDING *URINARY PROBLEMS (pain or burning when urinating, or frequent urination) *BOWEL PROBLEMS (unusual diarrhea, constipation, pain near the anus) TENDERNESS IN MOUTH AND THROAT WITH OR WITHOUT PRESENCE OF ULCERS (sore throat, sores in mouth, or a toothache) UNUSUAL RASH, SWELLING OR PAIN  UNUSUAL VAGINAL DISCHARGE OR ITCHING   Items with * indicate a potential emergency and should be followed up as soon as possible or go to the Emergency  Department if any problems should occur.  Please show the CHEMOTHERAPY ALERT CARD or IMMUNOTHERAPY ALERT CARD at check-in to the Emergency Department and triage nurse.  Should you have questions after your visit or need to cancel or reschedule your appointment, please contact [facility] [place] - A DEPT OF [facility] [phone]  and follow the prompts.  Office hours are 8:00 a.m. to 4:30 p.m. Monday - Friday. Please note that voicemails left after 4:00 p.m. may not be returned until the following business day.  We are closed weekends and major holidays. You have access to a nurse at all times for urgent questions. Please call the main number to the clinic [phone] and follow the prompts.  For any non-urgent questions, you may also contact your provider using MyChart. We now offer e-Visits for anyone 9 and older to request care online for non-urgent symptoms. For details visit [email or web address].   Also download the MyChart app! Go to the app store, search "MyChart", open the app, select [place], and log in with your MyChart username and password.    2024 Elsevier/Gold Standard (2021-09-15 00:00:00)

## 2023-10-03 ENCOUNTER — Telehealth: Payer: Self-pay | Admitting: Dietician

## 2023-10-03 ENCOUNTER — Other Ambulatory Visit: Payer: Self-pay | Admitting: *Deleted

## 2023-10-03 ENCOUNTER — Inpatient Hospital Stay: Admitting: Dietician

## 2023-10-03 MED ORDER — ALPRAZOLAM 0.25 MG PO TABS: 0.2500 mg | ORAL_TABLET | Freq: Two times a day (BID) | ORAL | 2 refills | Status: AC

## 2023-10-03 NOTE — Telephone Encounter (Signed)
 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 She is currently receiving reduced dose Folfox/Erbitux  + oral Braftovi  q14d. Patient is under the care of Dr. Cheree Cords.   5/23 - seen in ED for glossitis/stomatitis  6/3 - noted pt reported poor toleration to chemotherapy with wishes to discontinue  Spoke with patient via telephone. She reports ongoing anorexia. Pt forcing herself to eat. She is taking megace  twice daily as prescribed. This taste awful. Patient reports sore mouth and gums. It hurts to chew. She is doing lidocaine /mylanta rinses. Patient tolerating soft foods. Eating mostly mashed potatoes, mac/cheese, and jello. Reports getting down a piece of toast and slice of bacon this morning. This was uncomfortable, but sips of Coke help to get this along with oral medications down. Patient did try the Ensure. She does not care for the taste, but consumed a whole one yesterday.   Medications: reviewed   Labs: 6/6 - glucose 112, BUN 7, albumin 3.2  Anthropometrics: Wt 120 lb 1.6 oz on 6/3 decreased ~6% in 2 weeks - severe for time frame  5/23 - 127 lb 13.9 oz   NUTRITION DIAGNOSIS: Food and nutrition related knowledge deficit - ongoing    INTERVENTION:  Encourage small frequent meals, bites/sips q2-3h - recommend soft smooth textures for ease of intake (pudding, ice cream, creamed pots/gravy, creamy based soups) Continue appetite stimulant per MD Suggested trying baking soda salt water  rinses in addition to lidocaine  rinses Pt agreeable to daily Ensure  Support and encouragement     MONITORING, EVALUATION, GOAL: wt trends, intake, treatment plan    NEXT VISIT: Monday June 23 via telephone

## 2023-10-04 ENCOUNTER — Other Ambulatory Visit

## 2023-10-05 ENCOUNTER — Other Ambulatory Visit: Payer: Self-pay | Admitting: *Deleted

## 2023-10-05 ENCOUNTER — Telehealth: Payer: Self-pay | Admitting: *Deleted

## 2023-10-05 DIAGNOSIS — Z79899 Other long term (current) drug therapy: Secondary | ICD-10-CM

## 2023-10-05 DIAGNOSIS — E860 Dehydration: Secondary | ICD-10-CM

## 2023-10-05 DIAGNOSIS — C183 Malignant neoplasm of hepatic flexure: Secondary | ICD-10-CM

## 2023-10-05 DIAGNOSIS — C182 Malignant neoplasm of ascending colon: Secondary | ICD-10-CM

## 2023-10-05 DIAGNOSIS — M546 Pain in thoracic spine: Secondary | ICD-10-CM

## 2023-10-05 DIAGNOSIS — C189 Malignant neoplasm of colon, unspecified: Secondary | ICD-10-CM

## 2023-10-05 DIAGNOSIS — K1379 Other lesions of oral mucosa: Secondary | ICD-10-CM

## 2023-10-05 NOTE — Telephone Encounter (Signed)
 Received call from son Patricia Hurst with concerns for dehydration.  States she continues to lose weight and is not eating but about 400 cal daily, if that much.  Will bring in for labs and fluids tomorrow.  Dr. Cheree Cords aware.  Per Patricia Hurst, referral placed for palliative care with [facility].  They will reach out to him today and discuss Palliative vs Hospice for her.

## 2023-10-06 ENCOUNTER — Inpatient Hospital Stay

## 2023-10-06 ENCOUNTER — Other Ambulatory Visit: Payer: Self-pay

## 2023-10-06 VITALS — BP 141/67 | HR 73 | Temp 97.7°F | Resp 16 | Wt 120.9 lb

## 2023-10-06 DIAGNOSIS — E860 Dehydration: Secondary | ICD-10-CM

## 2023-10-06 DIAGNOSIS — R110 Nausea: Secondary | ICD-10-CM | POA: Diagnosis not present

## 2023-10-06 DIAGNOSIS — R0781 Pleurodynia: Secondary | ICD-10-CM | POA: Diagnosis not present

## 2023-10-06 DIAGNOSIS — C183 Malignant neoplasm of hepatic flexure: Secondary | ICD-10-CM | POA: Diagnosis not present

## 2023-10-06 DIAGNOSIS — Z79899 Other long term (current) drug therapy: Secondary | ICD-10-CM | POA: Diagnosis not present

## 2023-10-06 DIAGNOSIS — F419 Anxiety disorder, unspecified: Secondary | ICD-10-CM | POA: Diagnosis not present

## 2023-10-06 LAB — CBC
HCT: 34.2 % — ABNORMAL LOW (ref 36.0–46.0)
Hemoglobin: 11.8 g/dL — ABNORMAL LOW (ref 12.0–15.0)
MCH: 31.9 pg (ref 26.0–34.0)
MCHC: 34.5 g/dL (ref 30.0–36.0)
MCV: 92.4 fL (ref 80.0–100.0)
Platelets: 183 10*3/uL (ref 150–400)
RBC: 3.7 MIL/uL — ABNORMAL LOW (ref 3.87–5.11)
RDW: 14 % (ref 11.5–15.5)
WBC: 6.2 10*3/uL (ref 4.0–10.5)
nRBC: 0 % (ref 0.0–0.2)

## 2023-10-06 LAB — COMPREHENSIVE METABOLIC PANEL WITH GFR
ALT: 21 U/L (ref 0–44)
AST: 31 U/L (ref 15–41)
Albumin: 3.1 g/dL — ABNORMAL LOW (ref 3.5–5.0)
Alkaline Phosphatase: 51 U/L (ref 38–126)
Anion gap: 9 (ref 5–15)
BUN: 9 mg/dL (ref 8–23)
CO2: 25 mmol/L (ref 22–32)
Calcium: 8.9 mg/dL (ref 8.9–10.3)
Chloride: 103 mmol/L (ref 98–111)
Creatinine, Ser: 1.03 mg/dL — ABNORMAL HIGH (ref 0.44–1.00)
GFR, Estimated: 56 mL/min — ABNORMAL LOW (ref >60)
Glucose, Bld: 109 mg/dL — ABNORMAL HIGH (ref 70–99)
Potassium: 4 mmol/L (ref 3.5–5.1)
Sodium: 137 mmol/L (ref 135–145)
Total Bilirubin: 0.9 mg/dL (ref 0.0–1.2)
Total Protein: 6.3 g/dL — ABNORMAL LOW (ref 6.5–8.1)

## 2023-10-06 LAB — MAGNESIUM: Magnesium: 1.9 mg/dL (ref 1.7–2.4)

## 2023-10-06 MED ORDER — POTASSIUM CHLORIDE IN NACL 20-0.9 MEQ/L-% IV SOLN
Freq: Once | INTRAVENOUS | Status: AC
  Filled 2023-10-06: qty 1000

## 2023-10-06 MED ORDER — MAGNESIUM SULFATE 2 GM/50ML IV SOLN
2.0000 g | Freq: Once | INTRAVENOUS | Status: AC
  Administered 2023-10-06: 2 g via INTRAVENOUS
  Filled 2023-10-06: qty 50

## 2023-10-06 MED ORDER — ONDANSETRON HCL 4 MG/2ML IJ SOLN
8.0000 mg | Freq: Once | INTRAMUSCULAR | Status: AC
  Administered 2023-10-06: 8 mg via INTRAVENOUS
  Filled 2023-10-06: qty 4

## 2023-10-06 NOTE — Progress Notes (Signed)
 Patient tolerated hydration with no complaints voiced.  Port site clean and dry with good blood return noted before and after hydration.  No bruising or swelling noted with port.  Band aid applied.  VSS with discharge and left ambulatory with no s/s of distress noted.

## 2023-10-06 NOTE — Patient Instructions (Signed)

## 2023-10-06 NOTE — Progress Notes (Signed)
 Per MD note on 6/3 patient decided not to continue treatment, ok per Clinic [facility] to disenroll.

## 2023-10-10 ENCOUNTER — Other Ambulatory Visit: Payer: Self-pay

## 2023-10-14 ENCOUNTER — Other Ambulatory Visit: Payer: Self-pay

## 2023-10-17 ENCOUNTER — Inpatient Hospital Stay: Admitting: Dietician

## 2023-10-17 ENCOUNTER — Telehealth: Payer: Self-pay | Admitting: Dietician

## 2023-10-17 NOTE — Telephone Encounter (Signed)
 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 She is currently receiving reduced dose Folfox/Erbitux  + oral Braftovi  q14d. Patient is under the care of Dr. Rogers.    5/23 - seen in ED for glossitis/stomatitis   6/3 - noted pt reported poor toleration to chemotherapy with wishes to discontinue  Spoke with pt via telephone. She reports feeling much better. Pt refilled MMW as a new sore came up at the end of last week. Chewing is continuing to cause discomfort. She is eating soft foods (mac/cheese, baked pot, creamed pot). Pt is drinking one Ensure. She is drinking more water  even though she does not like it. She denies nausea, vomiting, diarrhea, constipation. Pt reports getting out of the house some. Has been going to grocery store with husband. Says she is not ready to drive yet.    Medications: reviewed   Labs: 6/12 - glucose 109, albumin 3.1, Cr 1.03  Anthropometrics: Last wt 120 lb 14.4 oz on 6/12  6/3 - 120 lb 1.6 oz 5/23 - 127 lb 13.9 oz 5/6 - 131 lb 9.6 oz     NUTRITION DIAGNOSIS: Food and nutrition related knowledge deficit - improving    INTERVENTION:  Continue strategies for increasing calories and protein with small frequent meals Reviewed importance of protein foods to promote healing - soft moist high protein foods list mailed  Continue Ensure Plus/equivalent - coupons mailed    MONITORING, EVALUATION, GOAL: wt trends, intake, GOC   NEXT VISIT: To be scheduled as needed. Pt has contact information

## 2023-10-19 DIAGNOSIS — H2513 Age-related nuclear cataract, bilateral: Secondary | ICD-10-CM | POA: Diagnosis not present

## 2023-10-19 DIAGNOSIS — H401131 Primary open-angle glaucoma, bilateral, mild stage: Secondary | ICD-10-CM | POA: Diagnosis not present

## 2023-10-27 ENCOUNTER — Inpatient Hospital Stay (HOSPITAL_BASED_OUTPATIENT_CLINIC_OR_DEPARTMENT_OTHER): Admitting: Hematology

## 2023-10-27 ENCOUNTER — Inpatient Hospital Stay

## 2023-10-27 ENCOUNTER — Inpatient Hospital Stay: Attending: Hematology

## 2023-10-27 VITALS — BP 125/59 | HR 64 | Temp 98.0°F | Resp 18 | Wt 120.1 lb

## 2023-10-27 DIAGNOSIS — R0781 Pleurodynia: Secondary | ICD-10-CM | POA: Insufficient documentation

## 2023-10-27 DIAGNOSIS — C183 Malignant neoplasm of hepatic flexure: Secondary | ICD-10-CM | POA: Insufficient documentation

## 2023-10-27 DIAGNOSIS — C182 Malignant neoplasm of ascending colon: Secondary | ICD-10-CM

## 2023-10-27 DIAGNOSIS — Z95828 Presence of other vascular implants and grafts: Secondary | ICD-10-CM

## 2023-10-27 DIAGNOSIS — C787 Secondary malignant neoplasm of liver and intrahepatic bile duct: Secondary | ICD-10-CM | POA: Diagnosis not present

## 2023-10-27 DIAGNOSIS — Z79899 Other long term (current) drug therapy: Secondary | ICD-10-CM | POA: Diagnosis not present

## 2023-10-27 DIAGNOSIS — F419 Anxiety disorder, unspecified: Secondary | ICD-10-CM | POA: Insufficient documentation

## 2023-10-27 LAB — CBC WITH DIFFERENTIAL/PLATELET
Abs Immature Granulocytes: 0.02 10*3/uL (ref 0.00–0.07)
Basophils Absolute: 0 10*3/uL (ref 0.0–0.1)
Basophils Relative: 1 %
Eosinophils Absolute: 0.3 10*3/uL (ref 0.0–0.5)
Eosinophils Relative: 5 %
HCT: 32.4 % — ABNORMAL LOW (ref 36.0–46.0)
Hemoglobin: 11.2 g/dL — ABNORMAL LOW (ref 12.0–15.0)
Immature Granulocytes: 0 %
Lymphocytes Relative: 25 %
Lymphs Abs: 1.5 10*3/uL (ref 0.7–4.0)
MCH: 32.5 pg (ref 26.0–34.0)
MCHC: 34.6 g/dL (ref 30.0–36.0)
MCV: 93.9 fL (ref 80.0–100.0)
Monocytes Absolute: 0.7 10*3/uL (ref 0.1–1.0)
Monocytes Relative: 11 %
Neutro Abs: 3.6 10*3/uL (ref 1.7–7.7)
Neutrophils Relative %: 58 %
Platelets: 155 10*3/uL (ref 150–400)
RBC: 3.45 MIL/uL — ABNORMAL LOW (ref 3.87–5.11)
RDW: 12.6 % (ref 11.5–15.5)
WBC: 6.1 10*3/uL (ref 4.0–10.5)
nRBC: 0 % (ref 0.0–0.2)

## 2023-10-27 LAB — COMPREHENSIVE METABOLIC PANEL WITH GFR
ALT: 37 U/L (ref 0–44)
AST: 31 U/L (ref 15–41)
Albumin: 3.5 g/dL (ref 3.5–5.0)
Alkaline Phosphatase: 39 U/L (ref 38–126)
Anion gap: 8 (ref 5–15)
BUN: 15 mg/dL (ref 8–23)
CO2: 24 mmol/L (ref 22–32)
Calcium: 9.2 mg/dL (ref 8.9–10.3)
Chloride: 103 mmol/L (ref 98–111)
Creatinine, Ser: 0.94 mg/dL (ref 0.44–1.00)
GFR, Estimated: >60 mL/min (ref >60)
Glucose, Bld: 106 mg/dL — ABNORMAL HIGH (ref 70–99)
Potassium: 3.9 mmol/L (ref 3.5–5.1)
Sodium: 135 mmol/L (ref 135–145)
Total Bilirubin: 1.4 mg/dL — ABNORMAL HIGH (ref 0.0–1.2)
Total Protein: 6.6 g/dL (ref 6.5–8.1)

## 2023-10-27 LAB — MAGNESIUM: Magnesium: 1.9 mg/dL (ref 1.7–2.4)
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
## 2023-10-27 NOTE — Progress Notes (Signed)
No fluids needed today.

## 2023-10-27 NOTE — Patient Instructions (Addendum)
 [facility] at [facility] Discharge Instructions   You were seen and examined today by Dr. Rogers.  He reviewed the results of your lab work which are normal/stable. You may stop taking magnesium  as your levels are normal.   We will see you back in 3 months.   Return as scheduled.    Thank you for choosing [facility] at [facility] to provide your oncology and hematology care.  To afford each patient quality time with our provider, please arrive at least 15 minutes before your scheduled appointment time.   If you have a lab appointment with the Cancer Center please come in thru the Main Entrance and check in at the main information desk.  You need to re-schedule your appointment should you arrive 10 or more minutes late.  We strive to give you quality time with our providers, and arriving late affects you and other patients whose appointments are after yours.  Also, if you no show three or more times for appointments you may be dismissed from the clinic at the providers discretion.     Again, thank you for choosing [facility].  Our hope is that these requests will decrease the amount of time that you wait before being seen by our physicians.       _____________________________________________________________  Should you have questions after your visit to [facility], please contact our office at [phone] and follow the prompts.  Our office hours are 8:00 a.m. and 4:30 p.m. Monday - Friday.  Please note that voicemails left after 4:00 p.m. may not be returned until the following business day.  We are closed weekends and major holidays.  You do have access to a nurse 24-7, just call the main number to the clinic [phone] and do not press any options, hold on the line and a nurse will answer the phone.    For prescription refill requests, have your pharmacy contact our office and allow 72 hours.    Due to  Covid, you will need to wear a mask upon entering the hospital. If you do not have a mask, a mask will be given to you at the Main Entrance upon arrival. For doctor visits, patients may have 1 support person age 66 or older with them. For treatment visits, patients can not have anyone with them due to social distancing guidelines and our immunocompromised population.

## 2023-10-28 ENCOUNTER — Other Ambulatory Visit: Payer: Self-pay

## 2023-10-28 LAB — CEA: CEA: 2.2 ng/mL (ref 0.0–4.7)

## 2023-11-05 DIAGNOSIS — Z90710 Acquired absence of both cervix and uterus: Secondary | ICD-10-CM | POA: Diagnosis not present

## 2023-11-05 DIAGNOSIS — R195 Other fecal abnormalities: Secondary | ICD-10-CM | POA: Diagnosis not present

## 2023-11-05 DIAGNOSIS — K5900 Constipation, unspecified: Secondary | ICD-10-CM | POA: Diagnosis not present

## 2023-11-05 DIAGNOSIS — K644 Residual hemorrhoidal skin tags: Secondary | ICD-10-CM | POA: Diagnosis not present

## 2023-11-09 ENCOUNTER — Encounter: Payer: Self-pay | Admitting: Hematology

## 2023-11-09 ENCOUNTER — Encounter (HOSPITAL_COMMUNITY): Payer: Self-pay | Admitting: Hematology

## 2023-11-10 ENCOUNTER — Encounter (HOSPITAL_COMMUNITY): Payer: Self-pay | Admitting: Hematology

## 2023-11-10 ENCOUNTER — Encounter: Payer: Self-pay | Admitting: Hematology

## 2023-11-10 NOTE — Progress Notes (Signed)
 Erroneous note

## 2023-11-23 DIAGNOSIS — I700 Atherosclerosis of aorta: Secondary | ICD-10-CM | POA: Diagnosis not present

## 2023-11-23 DIAGNOSIS — Z299 Encounter for prophylactic measures, unspecified: Secondary | ICD-10-CM | POA: Diagnosis not present

## 2023-11-23 DIAGNOSIS — Z7689 Persons encountering health services in other specified circumstances: Secondary | ICD-10-CM | POA: Diagnosis not present

## 2023-11-23 DIAGNOSIS — C183 Malignant neoplasm of hepatic flexure: Secondary | ICD-10-CM | POA: Diagnosis not present

## 2023-11-24 DIAGNOSIS — H5203 Hypermetropia, bilateral: Secondary | ICD-10-CM | POA: Diagnosis not present

## 2023-12-21 DIAGNOSIS — H401130 Primary open-angle glaucoma, bilateral, stage unspecified: Secondary | ICD-10-CM | POA: Diagnosis not present

## 2023-12-21 DIAGNOSIS — H2513 Age-related nuclear cataract, bilateral: Secondary | ICD-10-CM | POA: Diagnosis not present

## 2024-01-23 ENCOUNTER — Other Ambulatory Visit: Payer: Self-pay

## 2024-01-23 DIAGNOSIS — C182 Malignant neoplasm of ascending colon: Secondary | ICD-10-CM

## 2024-01-24 ENCOUNTER — Inpatient Hospital Stay: Attending: Hematology

## 2024-01-24 DIAGNOSIS — C183 Malignant neoplasm of hepatic flexure: Secondary | ICD-10-CM | POA: Insufficient documentation

## 2024-01-24 DIAGNOSIS — C182 Malignant neoplasm of ascending colon: Secondary | ICD-10-CM

## 2024-01-24 LAB — CBC WITH DIFFERENTIAL/PLATELET
Abs Immature Granulocytes: 0.01 K/uL (ref 0.00–0.07)
Basophils Absolute: 0.1 K/uL (ref 0.0–0.1)
Basophils Relative: 1 %
Eosinophils Absolute: 0.4 K/uL (ref 0.0–0.5)
Eosinophils Relative: 6 %
HCT: 32.2 % — ABNORMAL LOW (ref 36.0–46.0)
Hemoglobin: 11.1 g/dL — ABNORMAL LOW (ref 12.0–15.0)
Immature Granulocytes: 0 %
Lymphocytes Relative: 22 %
Lymphs Abs: 1.4 K/uL (ref 0.7–4.0)
MCH: 32 pg (ref 26.0–34.0)
MCHC: 34.5 g/dL (ref 30.0–36.0)
MCV: 92.8 fL (ref 80.0–100.0)
Monocytes Absolute: 0.7 K/uL (ref 0.1–1.0)
Monocytes Relative: 11 %
Neutro Abs: 3.9 K/uL (ref 1.7–7.7)
Neutrophils Relative %: 60 %
Platelets: 192 K/uL (ref 150–400)
RBC: 3.47 MIL/uL — ABNORMAL LOW (ref 3.87–5.11)
RDW: 12 % (ref 11.5–15.5)
WBC: 6.5 K/uL (ref 4.0–10.5)
nRBC: 0 % (ref 0.0–0.2)

## 2024-01-24 LAB — COMPREHENSIVE METABOLIC PANEL WITH GFR
ALT: 13 U/L (ref 0–44)
AST: 25 U/L (ref 15–41)
Albumin: 4.2 g/dL (ref 3.5–5.0)
Alkaline Phosphatase: 60 U/L (ref 38–126)
Anion gap: 11 (ref 5–15)
BUN: 11 mg/dL (ref 8–23)
CO2: 24 mmol/L (ref 22–32)
Calcium: 9.6 mg/dL (ref 8.9–10.3)
Chloride: 104 mmol/L (ref 98–111)
Creatinine, Ser: 1.09 mg/dL — ABNORMAL HIGH (ref 0.44–1.00)
GFR, Estimated: 52 mL/min — ABNORMAL LOW (ref >60)
Glucose, Bld: 89 mg/dL (ref 70–99)
Potassium: 4.3 mmol/L (ref 3.5–5.1)
Sodium: 140 mmol/L (ref 135–145)
Total Bilirubin: 1.2 mg/dL (ref 0.0–1.2)
Total Protein: 7 g/dL (ref 6.5–8.1)

## 2024-01-25 LAB — CEA: CEA: 2.2 ng/mL (ref 0.0–4.7)

## 2024-01-31 ENCOUNTER — Inpatient Hospital Stay: Admitting: Oncology

## 2024-01-31 NOTE — Progress Notes (Incomplete)
 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 female was transferred to my care after her prior physician has left.   ASSESSMENT & PLAN:   Assessment & Plan: Patricia Hurst  is a 78 y.o. female with ***  Assessment & Plan     The patient understands the plans discussed today and is in agreement with them.  She knows to contact our office if she develops concerns prior to her next appointment.  *** minutes of total time was spent for this patient encounter, including preparation,review of records,  face-to-face counseling with the patient and coordination of care, physical exam, and documentation of the encounter.    Patricia Hurst,[name], MD.,have documented all relevant documentation on the behalf of Mickiel Dry, MD,as directed by  Mickiel Dry, MD while in the presence of Mickiel Dry, MD.  ***  Patricia Hurst   CANCER CENTER [facility] [place] - A DEPT OF [name] [address] [place] [address] Dept: [phone] Dept Fax: [phone]   No orders of the defined types were placed in this encounter.    ONCOLOGY HISTORY:   I have reviewed her chart and materials related to her cancer extensively and collaborated history with the patient. 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 Biopsied.  Pathology: Invasive adenocarcinoma with a small focus of intramucosal carcinoma. MMR Preserved.  -05/24/2019: CT AP: Circumferential wall thickening in the ascending colon just proximal to the hepatic flexure,  compatible with tumor found at endoscopy. No findings of metastatic disease. Abnormal enhancement/wall thickening in the left kidney upper pole calyces/collecting system. Possibilities include urothelial tumor or local irritation from a tiny calculus. 1.2 by 0.6 by 0.7 cm fluid density lesion in the head of the pancreas. This could be a small postinflammatory cystic lesion or intraductal papillary mucinous neoplasm. -06/04/2019: Right hemicolectomy.  Pathology: Moderately differentiated adenocarcinoma, spanning 2 cm. Tumor invades pericolonic soft tissue. Negative margins. Three of fifteen lymph nodes positive for metastatic carcinoma (3/15). Staging: pT3, pN1b  -06/27/2019: CEA 1.3 -07/05/2019: CT chest: NED -07/19/2019: 1 cycle of Cap ox, discontinued due to poor toleration -08/15/2019: 1 cycle of FOLFOX dose reduced, discontinued secondary to diarrhea, tiredness, decreased eating and nausea -08/28/2019: MRI brain: NED -02/07/2020: CT AP: NED -2021-06/2023: Patient under surveillance with imaging showing NED -07/07/2023: Initial PET: The enlarging lesion in segment 5 of the liver adjacent to the cholecystectomy bed is markedly hypermetabolic, consistent with metastatic disease. Hypermetabolic upper abdominal lymph nodes consistent with metastatic disease. Scattered small pulmonary nodules in both lungs with low level metabolic activity, suspicious for metastatic disease although could reflect infection/inflammation. Small hypermetabolic left hilar lymph node or adjacent small pulmonary nodule. No evidence of osseous metastatic disease or peritoneal disease. -07/21/2023: Liver biopsy.  Pathology: Metastatic carcinoma consistent with upper GI primary.  -Guardant360: BRAF 600E, FANCA R435H, PTEN deletion, T p53 mutation -NGS: BRAF V600E, MS-stable, TMB-low, HER2 (0).  GPS AI suggestive of 66% colon cancer and 19% cholangiocarcinoma. -08/30/2023-current: EC+FOLFOX (BREAKWATER trial), FOLFOX at 60% dose  due to prior intolerance -08/30/2023-current: Braftovi  225 mg daily  Current Treatment:  ***  INTERVAL HISTORY:   Patricia Hurst is here today for follow up and to establish care with me for ***. Patient is accompanied by *** .     I have reviewed the past medical history, past surgical history, social history and family history with the patient and they are unchanged from previous note.  ALLERGIES:  is allergic to morphine sulfate.  MEDICATIONS:  Current Outpatient Medications  Medication Sig Dispense Refill   acetaminophen  (TYLENOL ) 500 MG tablet Take 500 mg by mouth See admin instructions. Take 500 mg at night, may take a second 500 mg dose as needed for pain     ALPRAZolam  (XANAX ) 0.25 MG tablet Take 1 tablet (0.25 mg total) by mouth 2 (two) times daily. 60 tablet 2   aluminum -magnesium  hydroxide 200-200 MG/5ML suspension Take 15 mLs by mouth every 6 (six) hours as needed for indigestion. Mix 1:1 with lidocaine  and swish and spit 355 mL 2   aspirin EC 81 MG tablet Take 1 tablet (81 mg total) by mouth daily.     brimonidine (ALPHAGAN) 0.2 % ophthalmic solution Place 1 drop into both eyes 2 (two) times daily.     Calcium  Carb-Cholecalciferol (CALCIUM  500 + D3 PO) Take 1 tablet by mouth at bedtime.      Cholecalciferol (VITAMIN D3 PO) Take by mouth daily at 6 (six) AM.     diphenoxylate -atropine  (LOMOTIL ) 2.5-0.025 MG tablet Take 2 tablets by mouth 4 (four) times daily as needed for diarrhea or loose stools. 60 tablet 1   ketorolac  (ACULAR ) 0.5 % ophthalmic solution Place 1 drop into the right eye 4 (four) times daily.     latanoprost (XALATAN) 0.005 % ophthalmic solution Place 1 drop into both eyes at bedtime.     levothyroxine  (SYNTHROID ) 25 MCG tablet Take 25 mcg by mouth daily before breakfast.     lidocaine  (XYLOCAINE ) 2 % solution Use as directed 15 mLs in the mouth or throat every 6 (six) hours as needed for mouth pain. Mix 1:1 with Maalox and swish and spit 100 mL 2    lidocaine -prilocaine  (EMLA ) cream Apply to affected area once 30 g 3   loperamide  (IMODIUM ) 2 MG capsule 2 capsules after 1st loose stool and 1 after each loose stool thereafter.  Do not exceed 8 capsules in a 24 hor period. 30 capsule 0   megestrol  (MEGACE ) 400 MG/10ML suspension Take 10 mLs (400 mg total) by mouth 2 (two) times daily. 480 mL 3   Menthol, Topical Analgesic, (BLUE-EMU MAXIMUM STRENGTH EX) Apply 1 application topically daily as needed (joint pain).     Multiple Vitamin (MULTIVITAMIN WITH MINERALS) TABS tablet Take 1 tablet by mouth at bedtime.     ondansetron  (ZOFRAN ) 8 MG tablet Take 1 tablet (8 mg total) by mouth every 8 (eight) hours as needed for nausea or vomiting. 20 tablet 3   prochlorperazine  (COMPAZINE ) 10 MG tablet Take 1 tablet (10 mg total) by mouth every 6 (six) hours as needed for nausea or vomiting. 60 tablet 3   psyllium (METAMUCIL) 58.6 % powder Take 1 packet by mouth daily as needed (for constipation). Mixed with OJ     rosuvastatin (CRESTOR) 5 MG tablet Take 5 mg by mouth at bedtime.     timolol  (TIMOPTIC ) 0.5 % ophthalmic solution Place 1 drop into both eyes daily.     traMADol  (ULTRAM ) 50 MG tablet TAKE 1 TABLET BY MOUTH EVERY 6 HOURS AS NEEDED 60 tablet 0   No  current facility-administered medications for this visit.   Facility-Administered Medications Ordered in Other Visits  Medication Dose Route Frequency Provider Last Rate Last Admin   loperamide  (IMODIUM ) capsule 2 mg  2 mg Oral PRN Rogers Hai, MD   4 mg at 09/20/23 1003   sodium chloride  flush (NS) 0.9 % injection 10 mL  10 mL Intravenous PRN Rogers Hai, MD   10 mL at 09/20/23 1220    REVIEW OF SYSTEMS:   Constitutional: Denies fevers, chills or abnormal weight loss Eyes: Denies blurriness of vision Ears, nose, mouth, throat, and face: Denies mucositis or sore throat Respiratory: Denies cough, dyspnea or wheezes Cardiovascular: Denies palpitation, chest discomfort or lower  extremity swelling Gastrointestinal:  Denies nausea, heartburn or change in bowel habits Skin: Denies abnormal skin rashes Lymphatics: Denies new lymphadenopathy or easy bruising Neurological:Denies numbness, tingling or new weaknesses Behavioral/Psych: Mood is stable, no new changes  All other systems were reviewed with the patient and are negative.   VITALS:  There were no vitals taken for this visit.  Wt Readings from Last 3 Encounters:  10/27/23 120 lb 1.6 oz (54.5 kg)  10/06/23 120 lb 14.4 oz (54.8 kg)  09/27/23 120 lb 1.6 oz (54.5 kg)    There is no height or weight on file to calculate BMI.  Performance status (ECOG): {CHL ONC [id number]  PHYSICAL EXAM:   GENERAL:alert, no distress and comfortable SKIN: skin color, texture, turgor are normal, no rashes or significant lesions EYES: normal, Conjunctiva are pink and non-injected, sclera clear OROPHARYNX:no exudate, no erythema and lips, buccal mucosa, and tongue normal  NECK: supple, thyroid  normal size, non-tender, without nodularity LYMPH:  no palpable lymphadenopathy in the cervical, axillary or inguinal LUNGS: clear to auscultation and percussion with normal breathing effort HEART: regular rate & rhythm and no murmurs and no lower extremity edema ABDOMEN:abdomen soft, non-tender and normal bowel sounds Musculoskeletal:no cyanosis of digits and no clubbing  NEURO: alert & oriented x 3 with fluent speech, no focal motor/sensory deficits  LABORATORY DATA:  I have reviewed the data as listed  No results found for: SPEP, UPEP  Lab Results  Component Value Date   WBC 6.5 01/24/2024   NEUTROABS 3.9 01/24/2024   HGB 11.1 (L) 01/24/2024   HCT 32.2 (L) 01/24/2024   MCV 92.8 01/24/2024   PLT 192 01/24/2024    @LASTCHEMISTRY @   RADIOGRAPHIC STUDIES: I have personally reviewed the radiological images as listed and agreed with the findings in the report. DG CV Line Injection CLINICAL DATA:  78 year old female  with history of Stage IV colon adenocarcinoma s/p left Port-A-Cath placed 07/04/2019 by General Surgery. Port has never been used but has been flushed regularly over the past 3 years without issue. Patient now with plans for chemotherapy, however upon assessment at cancer center Port-A-Cath flushes but does not aspirate. CV line injection requested.  EXAM: CV LINE INJECTION  TECHNIQUE: The procedure, risks, benefits, and alternatives were explained to patient. Questions regarding the procedure were encouraged and answered. The patient understands and consents to the procedure.  The left Port-A-Cath was accessed. Injected with 20mL Omnipaque  180. Images obtained by: Kacie Matthews PA-C  FINDINGS: Initial fluoroscopy demonstrated the left Port-A-Cath is looped within the left internal innomiate vein. Injection of contrast achieved with minimal resistance and no evidence of fibrin sheath or thrombus along the intracaval course of the catheter. The SVC is widely patent to the right atrium.  COMPLICATIONS: COMPLICATIONS None immediate  FLUOROSCOPY TIME:  2.7 mGy  COMPARISON:  None Available.  IMPRESSION: 1. Port catheter is malpositioned, tip in left innomiate vein. 2. No venous stenosis or thrombosis identified.  ACCESS: Consider catheter revision for optimal function.  Electronically Signed   By: JONETTA Faes M.D.   On: 08/26/2023 13:06

## 2024-02-02 ENCOUNTER — Other Ambulatory Visit: Payer: Self-pay

## 2024-02-08 ENCOUNTER — Encounter (INDEPENDENT_AMBULATORY_CARE_PROVIDER_SITE_OTHER): Payer: Self-pay | Admitting: Gastroenterology

## 2024-02-12 NOTE — Progress Notes (Signed)
 Patient Care Team: Hurst, Patricia C as PCP - General (Nurse Practitioner)  Clinic Day:  02/12/2024  Referring physician: Lori Thedora BROCKS, NP   CHIEF COMPLAINT:  CC: Stage IV(T3N1CM1) right colon adenocarcinoma, BRAF V600 E+   Patricia Hurst 78 y.o. female was transferred to my care after her prior physician has left.   ASSESSMENT & PLAN:   Assessment & Plan: Patricia Hurst  is a 78 y.o. female with metastatic colon adenocarcinoma   Metastatic colon adenocarcinoma, BRAF V600 positive Patient was initially diagnosed with stage III colon cancer s/p 2 cycles of adjuvant chemotherapy that were not tolerated well. Oncology history below Recurrence in 2025 with metastatic carcinoma and patient was started on chemotherapy along with BRAF inhibitor.  Patient did not tolerate this well either and discontinued treatment and was referred to palliative care  - Patient currently is doing well overall and is back to baseline functional capacity. - She was taken off from palliative care list at this time - Patient does not want to pursue treatment at this time. - Recommended patient to reach out to us  if she starts to feel weaker or has any symptoms.  At that time we offered to refer to hospice or bring her to the clinic for further evaluation. -Labs reviewed today.  CMP: Creatinine slightly elevated at 1.09, normal LFTs.  CBC: WBC: 6.5, hemoglobin: 11.1, platelets: 192.  CEA: 2.2 - Continue port flushes  Patient will call if she needs further evaluation in future.  Return to clinic as needed  AKI Likely secondary to dehydration creatinine: 1.09  - Recommended patient to stay hydrated and drink more water     The patient understands the plans discussed today and is in agreement with them.  She knows to contact our office if she develops concerns prior to her next appointment.  40 minutes of total time was spent for this patient encounter, including preparation,review of records,   face-to-face counseling with the patient and coordination of care, physical exam, and documentation of the encounter.    Patricia Hurst,acting as a Neurosurgeon for Patricia Dry, MD.,have documented all relevant documentation on the behalf of Patricia Dry, MD,as directed by  Patricia Dry, MD while in the presence of Patricia Dry, MD.  I, Patricia Dry MD, have reviewed the above documentation for accuracy and completeness, and I agree with the above.    Patricia Hurst  Patricia Hurst [facility] [place] - A DEPT OF Patricia Hurst Patricia Hurst [address] [place] Patricia Hurst   No orders of the defined types were placed in this encounter.    ONCOLOGY HISTORY:   I have reviewed her chart and materials related to her cancer extensively and collaborated history with the patient. 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 Biopsied.  Pathology: Invasive adenocarcinoma with a small focus of intramucosal carcinoma. MMR Preserved.  -05/24/2019: CT AP: Circumferential wall thickening in the ascending colon just proximal to the hepatic flexure, compatible with tumor found at endoscopy. No findings of metastatic disease. Abnormal enhancement/wall thickening in the left kidney upper pole calyces/collecting system. Possibilities include urothelial tumor or local irritation from a tiny calculus. 1.2 by 0.6 by 0.7 cm fluid density lesion in the head of the pancreas. This could be a small postinflammatory cystic lesion  or intraductal papillary mucinous neoplasm. -06/04/2019: Right hemicolectomy.  Pathology: Moderately differentiated adenocarcinoma, spanning 2 cm. Tumor invades pericolonic soft tissue. Negative margins. Three of fifteen lymph nodes positive  for metastatic carcinoma (3/15). Staging: pT3, pN1b  -06/27/2019: CEA 1.3 -07/05/2019: CT chest: NED -07/19/2019: 1 cycle of CAPEOX, discontinued due to poor toleration -08/15/2019: 1 cycle of FOLFOX dose reduced, discontinued secondary to diarrhea, tiredness, decreased eating and nausea -08/28/2019: MRI brain: NED -02/07/2020: CT AP: NED -2021-06/2023: Patient under surveillance with imaging showing NED -07/07/2023: Initial PET: The enlarging lesion in segment 5 of the liver adjacent to the cholecystectomy bed is markedly hypermetabolic, consistent with metastatic disease. Hypermetabolic upper abdominal lymph nodes consistent with metastatic disease. Scattered small pulmonary nodules in both lungs with low level metabolic activity, suspicious for metastatic disease although could reflect infection/inflammation. Small hypermetabolic left hilar lymph node or adjacent small pulmonary nodule. No evidence of osseous metastatic disease or peritoneal disease. -07/21/2023: Liver biopsy.  Pathology: Metastatic carcinoma consistent with upper GI primary.  -Guardant360: BRAF 600E, FANCA R435H, PTEN deletion, T p53 mutation - 08/14/2023: [facility] NGS: BRAF V600E, MSI-stable, TMB-low, HER2 (0).  GPS AI suggestive of 66% colon cancer and 19% cholangiocarcinoma.  -NTRK 1/2/3, RET, EGFR, PD-L1, KRAS, NF1, POLE: Negative -08/30/2023-09/13/2023: EC+FOLFOX (BREAKWATER trial), FOLFOX at 60% dose due to prior intolerance -08/30/2023-09/27/2023: Braftovi  225 mg daily - Discontinued chemotherapy for intolerance and was referred to palliative care  Current Treatment: Palliative care  INTERVAL HISTORY:   Patricia Hurst is here today for follow up and to establish care with me for metastatic colon cancer. Patient is accompanied by her husband and son.   Patient has been off treatment since June 2025 and did very poorly with chemotherapy.  She was on palliative care support until recently and was recently taken off the  list as she was doing much better.  Patient reported that she is doing very well and has occasional pain in her abdomen but no significant other complaints.  She reported that she has been babysitting her grandkids and helping with her son's dogs.  She is walking and driving and is overall doing very well.    I have reviewed the past medical history, past surgical history, social history and family history with the patient and they are unchanged from previous note.  ALLERGIES:  is allergic to morphine sulfate.  MEDICATIONS:  Current Outpatient Medications  Medication Sig Dispense Refill   acetaminophen  (TYLENOL ) 500 MG tablet Take 500 mg by mouth See admin instructions. Take 500 mg at night, may take a second 500 mg dose as needed for pain     ALPRAZolam  (XANAX ) 0.25 MG tablet Take 1 tablet (0.25 mg total) by mouth 2 (two) times daily. 60 tablet 2   aluminum -magnesium  hydroxide 200-200 MG/5ML suspension Take 15 mLs by mouth every 6 (six) hours as needed for indigestion. Mix 1:1 with lidocaine  and swish and spit 355 mL 2   aspirin EC 81 MG tablet Take 1 tablet (81 mg total) by mouth daily.     brimonidine (ALPHAGAN) 0.2 % ophthalmic solution Place 1 drop into both eyes 2 (two) times daily.     Calcium  Carb-Cholecalciferol (CALCIUM  500 + D3 PO) Take 1 tablet by mouth at bedtime.      Cholecalciferol (VITAMIN D3 PO) Take by mouth daily at 6 (six) AM.     diphenoxylate -atropine  (LOMOTIL ) 2.5-0.025 MG tablet Take 2 tablets by mouth 4 (four) times daily as needed for diarrhea or loose stools. 60 tablet 1  ketorolac  (ACULAR ) 0.5 % ophthalmic solution Place 1 drop into the right eye 4 (four) times daily.     latanoprost (XALATAN) 0.005 % ophthalmic solution Place 1 drop into both eyes at bedtime.     levothyroxine  (SYNTHROID ) 25 MCG tablet Take 25 mcg by mouth daily before breakfast.     lidocaine  (XYLOCAINE ) 2 % solution Use as directed 15 mLs in the mouth or throat every 6 (six) hours as needed for  mouth pain. Mix 1:1 with Maalox and swish and spit 100 mL 2   lidocaine -prilocaine  (EMLA ) cream Apply to affected area once 30 g 3   loperamide  (IMODIUM ) 2 MG capsule 2 capsules after 1st loose stool and 1 after each loose stool thereafter.  Do not exceed 8 capsules in a 24 hor period. 30 capsule 0   megestrol  (MEGACE ) 400 MG/10ML suspension Take 10 mLs (400 mg total) by mouth 2 (two) times daily. 480 mL 3   Menthol, Topical Analgesic, (BLUE-EMU MAXIMUM STRENGTH EX) Apply 1 application topically daily as needed (joint pain).     Multiple Vitamin (MULTIVITAMIN WITH MINERALS) TABS tablet Take 1 tablet by mouth at bedtime.     ondansetron  (ZOFRAN ) 8 MG tablet Take 1 tablet (8 mg total) by mouth every 8 (eight) hours as needed for nausea or vomiting. 20 tablet 3   prochlorperazine  (COMPAZINE ) 10 MG tablet Take 1 tablet (10 mg total) by mouth every 6 (six) hours as needed for nausea or vomiting. 60 tablet 3   psyllium (METAMUCIL) 58.6 % powder Take 1 packet by mouth daily as needed (for constipation). Mixed with OJ     rosuvastatin (CRESTOR) 5 MG tablet Take 5 mg by mouth at bedtime.     timolol  (TIMOPTIC ) 0.5 % ophthalmic solution Place 1 drop into both eyes daily.     traMADol  (ULTRAM ) 50 MG tablet TAKE 1 TABLET BY MOUTH EVERY 6 HOURS AS NEEDED 60 tablet 0   No current facility-administered medications for this visit.   Facility-Administered Medications Ordered in Other Visits  Medication Dose Route Frequency Provider Last Rate Last Admin   loperamide  (IMODIUM ) capsule 2 mg  2 mg Oral PRN Rogers Hai, MD   4 mg at 09/20/23 1003   sodium chloride  flush (NS) 0.9 % injection 10 mL  10 mL Intravenous PRN Rogers Hai, MD   10 mL at 09/20/23 1220    REVIEW OF SYSTEMS:   Constitutional: Denies fevers, chills or abnormal weight loss Eyes: Denies blurriness of vision Ears, nose, mouth, throat, and face: Denies mucositis or sore throat Respiratory: Denies cough, dyspnea or  wheezes Cardiovascular: Denies palpitation, chest discomfort or lower extremity swelling Gastrointestinal:  Denies nausea, heartburn or change in bowel habits Skin: Denies abnormal skin rashes Lymphatics: Denies new lymphadenopathy or easy bruising Neurological:Denies numbness, tingling or new weaknesses Behavioral/Psych: Mood is stable, no new changes  All other systems were reviewed with the patient and are negative.   VITALS:  There were no vitals taken for this visit.  Wt Readings from Last 3 Encounters:  10/27/23 120 lb 1.6 oz (54.5 kg)  10/06/23 120 lb 14.4 oz (54.8 kg)  09/27/23 120 lb 1.6 oz (54.5 kg)    There is no height or weight on file to calculate BMI.  Performance status (ECOG): 1 - Symptomatic but completely ambulatory  PHYSICAL EXAM:   GENERAL:alert, no distress and comfortable SKIN: skin color, texture, turgor are normal, no rashes or significant lesions LYMPH:  no palpable lymphadenopathy in the cervical, axillary or inguinal  LUNGS: clear to auscultation and percussion with normal breathing effort HEART: regular rate & rhythm and no murmurs and no lower extremity edema ABDOMEN:abdomen soft, non-tender and normal bowel sounds Musculoskeletal:no cyanosis of digits and no clubbing  NEURO: alert & oriented x 3 with fluent speech, no focal motor/sensory deficits  LABORATORY DATA:  I have reviewed the data as listed  Lab Results  Component Value Date   WBC 6.5 01/24/2024   NEUTROABS 3.9 01/24/2024   HGB 11.1 (L) 01/24/2024   HCT 32.2 (L) 01/24/2024   MCV 92.8 01/24/2024   PLT 192 01/24/2024     Chemistry      Component Value Date/Time   NA 140 01/24/2024 1039   K 4.3 01/24/2024 1039   CL 104 01/24/2024 1039   CO2 24 01/24/2024 1039   BUN 11 01/24/2024 1039   CREATININE 1.09 (H) 01/24/2024 1039   CREATININE 1.00 (H) 05/22/2019 0835      Component Value Date/Time   CALCIUM  9.6 01/24/2024 1039   ALKPHOS 60 01/24/2024 1039   AST 25 01/24/2024 1039    ALT 13 01/24/2024 1039   BILITOT 1.2 01/24/2024 1039       RADIOGRAPHIC STUDIES: I have personally reviewed the radiological images as listed and agreed with the findings in the report

## 2024-02-13 ENCOUNTER — Inpatient Hospital Stay: Attending: Hematology

## 2024-02-13 ENCOUNTER — Inpatient Hospital Stay: Attending: Hematology | Admitting: Oncology

## 2024-02-13 VITALS — BP 135/67 | HR 72 | Temp 98.1°F | Resp 18 | Wt 121.0 lb

## 2024-02-13 DIAGNOSIS — Z79890 Hormone replacement therapy (postmenopausal): Secondary | ICD-10-CM | POA: Diagnosis not present

## 2024-02-13 DIAGNOSIS — C182 Malignant neoplasm of ascending colon: Secondary | ICD-10-CM | POA: Diagnosis not present

## 2024-02-13 DIAGNOSIS — Z7982 Long term (current) use of aspirin: Secondary | ICD-10-CM | POA: Diagnosis not present

## 2024-02-13 DIAGNOSIS — Z79899 Other long term (current) drug therapy: Secondary | ICD-10-CM | POA: Diagnosis not present

## 2024-02-13 DIAGNOSIS — Z885 Allergy status to narcotic agent status: Secondary | ICD-10-CM | POA: Diagnosis not present

## 2024-02-13 DIAGNOSIS — N179 Acute kidney failure, unspecified: Secondary | ICD-10-CM | POA: Diagnosis not present

## 2024-02-13 DIAGNOSIS — Z1509 Genetic susceptibility to other malignant neoplasm: Secondary | ICD-10-CM | POA: Insufficient documentation

## 2024-02-13 DIAGNOSIS — C799 Secondary malignant neoplasm of unspecified site: Secondary | ICD-10-CM | POA: Insufficient documentation

## 2024-02-13 DIAGNOSIS — E860 Dehydration: Secondary | ICD-10-CM

## 2024-02-13 NOTE — Patient Instructions (Addendum)
 [facility] at [facility] Discharge Instructions   You were seen and examined today by Dr. Davonna.  She reviewed the results of your lab work which are normal/stable.   We will see you back as needed. Call us  if and when you need us  to discuss treatment or for a new referral for palliative care/hospice.   Return as needed.    Thank you for choosing [facility] at [facility] to provide your oncology and hematology care.  To afford each patient quality time with our provider, please arrive at least 15 minutes before your scheduled appointment time.   If you have a lab appointment with the Cancer Center please come in thru the Main Entrance and check in at the main information desk.  You need to re-schedule your appointment should you arrive 10 or more minutes late.  We strive to give you quality time with our providers, and arriving late affects you and other patients whose appointments are after yours.  Also, if you no show three or more times for appointments you may be dismissed from the clinic at the providers discretion.     Again, thank you for choosing [facility].  Our hope is that these requests will decrease the amount of time that you wait before being seen by our physicians.       _____________________________________________________________  Should you have questions after your visit to [facility], please contact our office at [phone] and follow the prompts.  Our office hours are 8:00 a.m. and 4:30 p.m. Monday - Friday.  Please note that voicemails left after 4:00 p.m. may not be returned until the following business day.  We are closed weekends and major holidays.  You do have access to a nurse 24-7, just call the main number to the clinic [phone] and do not press any options, hold on the line and a nurse will answer the phone.    For prescription refill requests, have your pharmacy contact our  office and allow 72 hours.    Due to Covid, you will need to wear a mask upon entering the hospital. If you do not have a mask, a mask will be given to you at the Main Entrance upon arrival. For doctor visits, patients may have 1 support person age 71 or older with them. For treatment visits, patients can not have anyone with them due to social distancing guidelines and our immunocompromised population.

## 2024-02-13 NOTE — Progress Notes (Signed)
 Patients port flushed without difficulty.  No blood return noted with no bruising, discomfort, redness, or swelling noted at site.  No complaints voiced while flushing. Port flushes with ease. MD is aware of no blood return. No new orders. No orders for labs to be drawn today. Gauze dressing applied.  VSS with discharge and left in satisfactory condition with no s/s of distress noted. All follow ups as scheduled.       Patricia Hurst

## 2024-02-14 ENCOUNTER — Other Ambulatory Visit: Payer: Self-pay

## 2024-03-05 ENCOUNTER — Other Ambulatory Visit: Payer: Self-pay | Admitting: *Deleted

## 2024-03-08 DIAGNOSIS — N183 Chronic kidney disease, stage 3 unspecified: Secondary | ICD-10-CM | POA: Diagnosis not present

## 2024-03-08 DIAGNOSIS — C183 Malignant neoplasm of hepatic flexure: Secondary | ICD-10-CM | POA: Diagnosis not present

## 2024-03-08 DIAGNOSIS — F419 Anxiety disorder, unspecified: Secondary | ICD-10-CM | POA: Diagnosis not present

## 2024-03-08 DIAGNOSIS — C182 Malignant neoplasm of ascending colon: Secondary | ICD-10-CM | POA: Diagnosis not present

## 2024-03-08 DIAGNOSIS — Z299 Encounter for prophylactic measures, unspecified: Secondary | ICD-10-CM | POA: Diagnosis not present

## 2024-03-27 DIAGNOSIS — Z1339 Encounter for screening examination for other mental health and behavioral disorders: Secondary | ICD-10-CM | POA: Diagnosis not present

## 2024-03-27 DIAGNOSIS — C189 Malignant neoplasm of colon, unspecified: Secondary | ICD-10-CM | POA: Diagnosis not present

## 2024-03-27 DIAGNOSIS — E039 Hypothyroidism, unspecified: Secondary | ICD-10-CM | POA: Diagnosis not present

## 2024-03-27 DIAGNOSIS — Z299 Encounter for prophylactic measures, unspecified: Secondary | ICD-10-CM | POA: Diagnosis not present

## 2024-03-27 DIAGNOSIS — R5383 Other fatigue: Secondary | ICD-10-CM | POA: Diagnosis not present

## 2024-03-27 DIAGNOSIS — Z0000 Encounter for general adult medical examination without abnormal findings: Secondary | ICD-10-CM | POA: Diagnosis not present

## 2024-03-27 DIAGNOSIS — Z1331 Encounter for screening for depression: Secondary | ICD-10-CM | POA: Diagnosis not present

## 2024-03-27 DIAGNOSIS — C787 Secondary malignant neoplasm of liver and intrahepatic bile duct: Secondary | ICD-10-CM | POA: Diagnosis not present

## 2024-03-27 DIAGNOSIS — E7800 Pure hypercholesterolemia, unspecified: Secondary | ICD-10-CM | POA: Diagnosis not present

## 2024-03-27 DIAGNOSIS — Z79899 Other long term (current) drug therapy: Secondary | ICD-10-CM | POA: Diagnosis not present

## 2024-03-27 DIAGNOSIS — Z7189 Other specified counseling: Secondary | ICD-10-CM | POA: Diagnosis not present

## 2024-04-23 ENCOUNTER — Encounter: Payer: Self-pay | Admitting: *Deleted

## 2024-05-07 ENCOUNTER — Encounter: Payer: Self-pay | Admitting: Oncology

## 2024-05-07 ENCOUNTER — Encounter (HOSPITAL_COMMUNITY): Payer: Self-pay | Admitting: Oncology

## 2024-05-14 ENCOUNTER — Inpatient Hospital Stay: Attending: Hematology

## 2024-05-14 DIAGNOSIS — Z452 Encounter for adjustment and management of vascular access device: Secondary | ICD-10-CM | POA: Insufficient documentation

## 2024-05-14 DIAGNOSIS — C182 Malignant neoplasm of ascending colon: Secondary | ICD-10-CM | POA: Insufficient documentation

## 2024-05-14 NOTE — Progress Notes (Signed)
"  °  Patient's port flushed without difficulty.  No blood return noted with no bruising, discomfort, redness, or swelling noted at site.  No complaints voiced while flushing. Port flushes with ease. No orders for labs to be drawn today. Gauze dressing applied.  VSS with discharge and left in satisfactory condition with no s/s of distress noted. All follow ups as scheduled.      Alvino Lechuga  "

## 2024-05-15 ENCOUNTER — Other Ambulatory Visit: Payer: Self-pay

## 2024-08-17 ENCOUNTER — Inpatient Hospital Stay

## 2024-11-16 ENCOUNTER — Inpatient Hospital Stay

## 2025-02-15 ENCOUNTER — Inpatient Hospital Stay

## 2025-05-17 ENCOUNTER — Inpatient Hospital Stay
